# Patient Record
Sex: Female | Born: 1975 | Hispanic: Yes | Marital: Married | Smoking: Never smoker
Health system: Southern US, Community
[De-identification: ages and names within clinical notes are randomized; demographics above are authoritative.]

## PROBLEM LIST (undated history)

## (undated) DIAGNOSIS — H0489 Other disorders of lacrimal system: Secondary | ICD-10-CM

## (undated) DIAGNOSIS — Z8632 Personal history of gestational diabetes: Secondary | ICD-10-CM

## (undated) DIAGNOSIS — D649 Anemia, unspecified: Secondary | ICD-10-CM

## (undated) DIAGNOSIS — I10 Essential (primary) hypertension: Secondary | ICD-10-CM

## (undated) DIAGNOSIS — Z992 Dependence on renal dialysis: Secondary | ICD-10-CM

## (undated) DIAGNOSIS — N189 Chronic kidney disease, unspecified: Secondary | ICD-10-CM

## (undated) DIAGNOSIS — K219 Gastro-esophageal reflux disease without esophagitis: Secondary | ICD-10-CM

## (undated) HISTORY — PX: LAPAROSCOPY: SHX197

## (undated) HISTORY — PX: KIDNEY TRANSPLANT: SHX239

## (undated) HISTORY — PX: ENDOVENOUS ABLATION SAPHENOUS VEIN W/ LASER: SUR449

## (undated) HISTORY — DX: Personal history of gestational diabetes: Z86.32

## (undated) HISTORY — PX: TUBAL LIGATION: SHX77

## (undated) HISTORY — DX: Essential (primary) hypertension: I10

---
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
## 2012-06-29 ENCOUNTER — Encounter: Payer: Self-pay | Admitting: Family Medicine

## 2012-06-29 ENCOUNTER — Ambulatory Visit (INDEPENDENT_AMBULATORY_CARE_PROVIDER_SITE_OTHER): Payer: Self-pay | Admitting: Family Medicine

## 2012-06-29 VITALS — BP 141/90 | HR 68 | Ht 66.0 in | Wt 165.0 lb

## 2012-06-29 DIAGNOSIS — M722 Plantar fascial fibromatosis: Secondary | ICD-10-CM | POA: Insufficient documentation

## 2012-06-29 DIAGNOSIS — G2581 Restless legs syndrome: Secondary | ICD-10-CM

## 2012-06-29 DIAGNOSIS — M79672 Pain in left foot: Secondary | ICD-10-CM

## 2012-06-29 DIAGNOSIS — Z8632 Personal history of gestational diabetes: Secondary | ICD-10-CM

## 2012-06-29 DIAGNOSIS — M222X9 Patellofemoral disorders, unspecified knee: Secondary | ICD-10-CM | POA: Insufficient documentation

## 2012-06-29 DIAGNOSIS — M79671 Pain in right foot: Secondary | ICD-10-CM

## 2012-06-29 DIAGNOSIS — M222X2 Patellofemoral disorders, left knee: Secondary | ICD-10-CM

## 2012-06-29 DIAGNOSIS — M79609 Pain in unspecified limb: Secondary | ICD-10-CM

## 2012-06-29 DIAGNOSIS — M25569 Pain in unspecified knee: Secondary | ICD-10-CM

## 2012-06-29 HISTORY — DX: Personal history of gestational diabetes: Z86.32

## 2012-06-29 NOTE — Progress Notes (Signed)
CC: Sara Meza is a 37 y.o. female is here for Establish Care and tingling in toes   Subjective: HPI:  Very pleasant 37 year old here to establish care  Patient complains of 2-3 months bilateral tingling and burning sensation in all 10 toes. It is of moderate severity. It is worse with inactivity greatly improved with activity. Worse at night and with standing. She has tried wearing looser and more comfortable shoes no longer wearing high heels but no improvement in pain. It is present on a daily basis. She's never had this before. She does endorse a sensation of restlessness in both feet the more she moves the feet the more discuss away as it relieves the pain described above. She denies skin color changes, swelling of the feet, nor joint pain in the feet. She denies skin breakdown  Patient left knee pain that has been present for on and off for a year. It is described as a sharp and swelling sensation mild to moderate in severity it is worse within the first seconds to minutes of standing after sitting for long periods of time. It is painless when sitting stationary, it does not bother her when climbing or descending stairs. She has never had trauma to her knee. The pain is nonradiating and localized within the knee. No interventions as of yet. Nothing else makes better or worse     Review Of Systems Outlined In HPI  Past Medical History  Diagnosis Date  . History of gestational diabetes 06/29/2012     Family History  Problem Relation Age of Onset  . Diabetes Father      History  Substance Use Topics  . Smoking status: Not on file  . Smokeless tobacco: Not on file  . Alcohol Use: Yes     Objective: Filed Vitals:   06/29/12 1119  BP: 141/90  Pulse: 68    Vital signs reviewed. General: Alert and Oriented, No Acute Distress HEENT: Pupils equal, round, reactive to light. Conjunctivae clear.  External ears unremarkable.  Moist mucous membranes. Lungs: Clear and comfortable  work of breathing, speaking in full sentences without accessory muscle use. Cardiac: Regular rate and rhythm.  Neuro: CN II-XII grossly intact, gait normal. Extremities: No peripheral edema.  Strong peripheral pulses.  Left knee: Full range of motion strength throughout the knee, McMurray's negative, negative anterior drawer, no patellar apprehension with lateral or medial deviation but medial deviation does reproduce pain significantly. No pain with palpation of patella nor in the popliteal space. No joint line tenderness. No crepitus Diabetic Foot Exam: Dorsalis pedis pulses 1+ bilaterally.  Monofilament sensation intact on dorsal surface but deficient on plantar surface bilaterally.  Vibratory sensation in first MCP is preserved bilaterally No signs of infection, skin breakdown, nor ulceration. Mental Status: No depression, anxiety, nor agitation. Logical though process. Skin: Warm and dry.  Assessment & Plan: Sara Meza was seen today for establish care and tingling in toes.  Diagnoses and associated orders for this visit:  History of gestational diabetes - Hemoglobin A1c  Patella-femoral syndrome, left  Foot pain, bilateral - Iron and TIBC - TSH - B12 - Hemoglobin A1c  Restless legs - Iron and TIBC - TSH - B12 - Hemoglobin A1c    Foot pain with restless legs: History is suspicious for peripheral neuropathy will rule out type 2 diabetes, B12 deficiency, thyroid abnormality. Will rule out iron deficiency for classic restless leg syndrome Discussed with patient I believe her left knee pain is due to patellofemoral syndrome, we discussed  the etiology of this pain and she was given a home exercise plan to be performed daily for the next 2 weeks. Followup we based on results from above.  Return in about 4 weeks (around 07/27/2012).

## 2012-06-30 ENCOUNTER — Telehealth: Payer: Self-pay | Admitting: Family Medicine

## 2012-06-30 DIAGNOSIS — M79672 Pain in left foot: Secondary | ICD-10-CM

## 2012-06-30 DIAGNOSIS — M79671 Pain in right foot: Secondary | ICD-10-CM

## 2012-06-30 DIAGNOSIS — G2581 Restless legs syndrome: Secondary | ICD-10-CM

## 2012-06-30 LAB — HEMOGLOBIN A1C: Mean Plasma Glucose: 111 mg/dL (ref <117)

## 2012-06-30 LAB — IRON AND TIBC
%SAT: 9 % — ABNORMAL LOW (ref 20–55)
Iron: 31 ug/dL — ABNORMAL LOW (ref 42–145)
UIBC: 314 ug/dL (ref 125–400)

## 2012-06-30 LAB — TSH: TSH: 1.88 u[IU]/mL (ref 0.350–4.500)

## 2012-06-30 MED ORDER — AMITRIPTYLINE HCL 25 MG PO TABS: ORAL_TABLET | ORAL | Status: DC

## 2012-06-30 MED ORDER — FERROUS SULFATE 325 (65 FE) MG PO TBEC: 325.0000 mg | DELAYED_RELEASE_TABLET | Freq: Two times a day (BID) | ORAL | Status: DC

## 2012-06-30 NOTE — Telephone Encounter (Signed)
Pt notified and voiced understanding 

## 2012-06-30 NOTE — Telephone Encounter (Signed)
Seth Bake, Will you please let Mrs. Kooi know that her labs showed that she does not have diabetes, thyroid dysfunction, or a vitamin deficiency.  This is great news.  Her Iron level is somewhat low and could certainly be contributing to her frequent need to move her feet.  I'd encourage her to start OTC ferrous sulfate 325mg  twice a day for the next month to replace her iron levels.  I've sent in an Rx for amitriptyline to help with her tingling symptoms, I'd encourage her to f/u in a month to see if we need to continue the above regimen.

## 2012-07-06 ENCOUNTER — Telehealth: Payer: Self-pay | Admitting: *Deleted

## 2012-07-06 DIAGNOSIS — M25569 Pain in unspecified knee: Secondary | ICD-10-CM

## 2012-07-06 MED ORDER — DICLOFENAC SODIUM 50 MG PO TBEC: DELAYED_RELEASE_TABLET | ORAL | Status: DC

## 2012-07-06 NOTE — Telephone Encounter (Signed)
Pt called and states her knee pain has become worse since having started the excercises. Please advise if there is anything else she can do

## 2012-07-06 NOTE — Telephone Encounter (Signed)
Pt notified and will schedule appt with Dr. Darene Lamer

## 2012-07-06 NOTE — Telephone Encounter (Signed)
Sara Meza sent an Rx of diclofenac an antiinflammatory to her [facility] on file.  If things are getting worse then hold off on any more exercises.  I'd encourage her to see Dr. Darene Lamer here at her convienece as a sports medicine consult to see if xrays or other diagnositcs are needed.

## 2012-07-07 ENCOUNTER — Encounter: Payer: Self-pay | Admitting: Sports Medicine

## 2012-07-07 ENCOUNTER — Ambulatory Visit (INDEPENDENT_AMBULATORY_CARE_PROVIDER_SITE_OTHER): Payer: Self-pay | Admitting: Sports Medicine

## 2012-07-07 VITALS — BP 123/83 | HR 77

## 2012-07-07 DIAGNOSIS — M25569 Pain in unspecified knee: Secondary | ICD-10-CM

## 2012-07-07 DIAGNOSIS — M222X2 Patellofemoral disorders, left knee: Secondary | ICD-10-CM

## 2012-07-07 NOTE — Assessment & Plan Note (Signed)
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 Go ahead and start diclofenac 50 mg 3 times a day. Work hard on patellofemoral rehabilitation. Patellar stabilizing brace. X-rays. Return in 4 weeks to see how things are going.

## 2012-07-07 NOTE — Progress Notes (Signed)
   Subjective:    I'm seeing this patient as a consultation for:  Dr. Ileene Rubens  CC: Left knee pain  HPI: This is a very pleasant 37 year old female who comes in with a several month history of pain she localizes over the anterior aspect of her left knee, worse with squatting, going up and down stairs, getting up out of a chair. She denies trauma, has occasional grinding sensations but no locking or popping or buckling, on the anterior aspect of her left knee. Pain is localized to this area, moderate. She saw  Dr. Ileene Rubens who prescribed diclofenac 3 times a day, as well as home exercises appropriately. She has not done any of this.   Past medical history, Surgical history, Family history not pertinant except as noted below, Social history, Allergies, and medications have been entered into the medical record, reviewed, and no changes needed.   Review of Systems: No headache, visual changes, nausea, vomiting, diarrhea, constipation, dizziness, abdominal pain, skin rash, fevers, chills, night sweats, weight loss, swollen lymph nodes, body aches, joint swelling, muscle aches, chest pain, shortness of breath, mood changes, visual or auditory hallucinations.   Objective:   General: Well Developed, well nourished, and in no acute distress.  Neuro/Psych: Alert and oriented x3, extra-ocular muscles intact, able to move all 4 extremities, sensation grossly intact. Skin: Warm and dry, no rashes noted.  Respiratory: Not using accessory muscles, speaking in full sentences, trachea midline.  Cardiovascular: Pulses palpable, no extremity edema. Abdomen: Does not appear distended. Left Knee: Normal to inspection with no erythema or effusion or obvious bony abnormalities. Palpation normal with no warmth, joint line tenderness, patellar tenderness, or condyle tenderness. ROM full in flexion and extension and lower leg rotation. Ligaments with solid consistent endpoints including ACL, PCL, LCL, MCL. Negative  Mcmurray's, Apley's, and Thessalonian tests. Painful patellar compression. Patellar glide with mild crepitus. Patellar and quadriceps tendons unremarkable. Hamstring and quadriceps strength is normal.   Patellar stabilizing brace was strapped on by me. Impression and Recommendations:   This case required medical decision making of moderate complexity.

## 2012-08-04 ENCOUNTER — Ambulatory Visit: Payer: Self-pay | Admitting: Sports Medicine

## 2012-08-25 ENCOUNTER — Ambulatory Visit (INDEPENDENT_AMBULATORY_CARE_PROVIDER_SITE_OTHER): Payer: Self-pay

## 2012-08-25 ENCOUNTER — Ambulatory Visit (INDEPENDENT_AMBULATORY_CARE_PROVIDER_SITE_OTHER): Payer: Self-pay | Admitting: Sports Medicine

## 2012-08-25 ENCOUNTER — Encounter: Payer: Self-pay | Admitting: Sports Medicine

## 2012-08-25 VITALS — BP 146/98 | HR 77 | Wt 164.0 lb

## 2012-08-25 DIAGNOSIS — M25572 Pain in left ankle and joints of left foot: Secondary | ICD-10-CM | POA: Insufficient documentation

## 2012-08-25 DIAGNOSIS — M25579 Pain in unspecified ankle and joints of unspecified foot: Secondary | ICD-10-CM

## 2012-08-25 DIAGNOSIS — M25569 Pain in unspecified knee: Secondary | ICD-10-CM

## 2012-08-25 DIAGNOSIS — M222X2 Patellofemoral disorders, left knee: Secondary | ICD-10-CM

## 2012-08-25 MED ORDER — DICLOFENAC SODIUM 75 MG PO TBEC: 75.0000 mg | DELAYED_RELEASE_TABLET | Freq: Two times a day (BID) | ORAL | Status: DC

## 2012-08-25 NOTE — Progress Notes (Signed)
  Subjective:    CC: Follow up  HPI: Left knee pain: Symptoms are mostly patellofemoral, she has done 2 months of physical therapy, as well as diclofenac twice a day only 50 mg. Overall symptoms are not improved. Persistent, localized in the anterior knee, worse with squats, and going up and down stairs.  Left ankle pain: She is getting some swelling when she has a knee brace on and this is painful, no injuries to the ankle, she does wear flats most of the time.  Past medical history, Surgical history, Family history not pertinant except as noted below, Social history, Allergies, and medications have been entered into the medical record, reviewed, and no changes needed.   Review of Systems: No fevers, chills, night sweats, weight loss, chest pain, or shortness of breath.   Objective:    General: Well Developed, well nourished, and in no acute distress.  Neuro: Alert and oriented x3, extra-ocular muscles intact, sensation grossly intact.  HEENT: Normocephalic, atraumatic, pupils equal round reactive to light, neck supple, no masses, no lymphadenopathy, thyroid nonpalpable.  Skin: Warm and dry, no rashes. Cardiac: Regular rate and rhythm, no murmurs rubs or gallops, no lower extremity edema.  Respiratory: Clear to auscultation bilaterally. Not using accessory muscles, speaking in full sentences. Left Knee: Normal to inspection with no erythema or effusion or obvious bony abnormalities. Palpation normal with no warmth, joint line tenderness, patellar tenderness, or condyle tenderness. ROM full in flexion and extension and lower leg rotation. Ligaments with solid consistent endpoints including ACL, PCL, LCL, MCL. Negative Mcmurray's, Apley's, and Thessalonian tests. Tender to palpation under the patellar facets. Patellar glide without crepitus. Patellar and quadriceps tendons unremarkable. Hamstring and quadriceps strength is normal.  Left Ankle: No visible erythema or swelling. Range of  motion is full in all directions. Strength is 5/5 in all directions. Stable lateral and medial ligaments; squeeze test and kleiger test unremarkable; Tender to palpation along the lateral talar dome. No pain at base of 5th MT; No tenderness over cuboid; No tenderness over N spot or navicular prominence No tenderness on posterior aspects of lateral and medial malleolus No sign of peroneal tendon subluxations or tenderness to palpation Negative tarsal tunnel tinel's Able to walk 4 steps.  Procedure: Real-time Ultrasound Guided Injection of left knee Device: GE Logiq E  Verbal informed consent obtained.  Time-out conducted.  Noted no overlying erythema, induration, or other signs of local infection.  Skin prepped in a sterile fashion.  Local anesthesia: Topical Ethyl chloride.  With sterile technique and under real time ultrasound guidance:  25-gauge needle advanced into the suprapatellar recess, 2 cc Kenalog 44 cc lidocaine injected easily. Completed without difficulty  Pain immediately resolved suggesting accurate placement of the medication.  Advised to call if fevers/chills, erythema, induration, drainage, or persistent bleeding.  Images permanently stored and available for review in the ultrasound unit.  Impression: Technically successful ultrasound guided injection.  Impression and Recommendations:

## 2012-08-25 NOTE — Assessment & Plan Note (Signed)
Persistent patellofemoral pain status post 2 months of conservative measures including rehabilitation and patellar stabilizing brace, as well as NSAIDs. Injection as above. X-rays. Return in one month for this.

## 2012-08-25 NOTE — Assessment & Plan Note (Signed)
X-rays, ankle exercises. Increasing NSAIDs. Some of the swelling may be related to the proximal knee brace.

## 2012-09-22 ENCOUNTER — Ambulatory Visit: Payer: Self-pay | Admitting: Sports Medicine

## 2012-09-25 ENCOUNTER — Ambulatory Visit (INDEPENDENT_AMBULATORY_CARE_PROVIDER_SITE_OTHER): Payer: Self-pay | Admitting: Sports Medicine

## 2012-09-25 ENCOUNTER — Encounter: Payer: Self-pay | Admitting: Sports Medicine

## 2012-09-25 VITALS — BP 130/83 | HR 75 | Wt 162.0 lb

## 2012-09-25 DIAGNOSIS — M222X2 Patellofemoral disorders, left knee: Secondary | ICD-10-CM

## 2012-09-25 DIAGNOSIS — M79671 Pain in right foot: Secondary | ICD-10-CM

## 2012-09-25 DIAGNOSIS — M25579 Pain in unspecified ankle and joints of unspecified foot: Secondary | ICD-10-CM

## 2012-09-25 DIAGNOSIS — M25572 Pain in left ankle and joints of left foot: Secondary | ICD-10-CM

## 2012-09-25 DIAGNOSIS — M79672 Pain in left foot: Secondary | ICD-10-CM

## 2012-09-25 DIAGNOSIS — M79609 Pain in unspecified limb: Secondary | ICD-10-CM

## 2012-09-25 NOTE — Assessment & Plan Note (Signed)
Resolved with bracing, physical therapy, Mobic, and injection. Return as needed for this, continue exercises.

## 2012-09-25 NOTE — Progress Notes (Signed)
  Subjective:    CC: Followup  HPI: Patellofemoral pain: Completely resolved with brace, PT, and injection at the last visit.  Left ankle pain: Still swells, she does recall an injury several months back that resulted in bruising and swelling, now she has pain which he localizes over the lateral talar dome, she also gets an abnormal popping sensation. Pain is mild, persistent. No radiation.  Bilateral foot pain: Pain is localized to the calcaneal insertion of the plantar fascia, no radiation, worse with the first few steps of the day.  Past medical history, Surgical history, Family history not pertinant except as noted below, Social history, Allergies, and medications have been entered into the medical record, reviewed, and no changes needed.   Review of Systems: No fevers, chills, night sweats, weight loss, chest pain, or shortness of breath.   Objective:    General: Well Developed, well nourished, and in no acute distress.  Neuro: Alert and oriented x3, extra-ocular muscles intact, sensation grossly intact.  HEENT: Normocephalic, atraumatic, pupils equal round reactive to light, neck supple, no masses, no lymphadenopathy, thyroid nonpalpable.  Skin: Warm and dry, no rashes. Cardiac: Regular rate and rhythm, no murmurs rubs or gallops, no lower extremity edema.  Respiratory: Clear to auscultation bilaterally. Not using accessory muscles, speaking in full sentences. Right Ankle: No visible erythema or swelling. Range of motion is full in all directions. Strength is 5/5 in all directions. Negative kleiger test, negative squeeze test. Positive anterior drawer test with significant anterior luxation of the ankle, negative talar tilt. Tender to palpation over the lateral talar dome. No pain at base of 5th MT; No tenderness over cuboid; No tenderness over N spot or navicular prominence No tenderness on posterior aspects of lateral and medial malleolus No sign of peroneal tendon  subluxations or tenderness to palpation Negative tarsal tunnel tinel's Able to walk 4 steps.  Impression and Recommendations:

## 2012-09-25 NOTE — Assessment & Plan Note (Signed)
This likely represents plantar fasciitis. I would like physical therapist to work with her from plantar fascia exercises and stretches, I'm also going to go for custom orthotics. Await to see her back for custom orthotics, and then in 4-6 weeks for consideration of injection if no better.

## 2012-09-25 NOTE — Assessment & Plan Note (Signed)
With a positive anterior drawer sign, this likely represents a distant ligamentous injury. We're going to focus aggressively on ankle rehabilitation, this will likely improve her pain. I like her to do for 6 weeks a formal physical therapy, she will return to see me after that we consider further investigation if no better.

## 2012-10-05 ENCOUNTER — Encounter: Payer: Self-pay | Admitting: Sports Medicine

## 2012-10-09 ENCOUNTER — Ambulatory Visit (INDEPENDENT_AMBULATORY_CARE_PROVIDER_SITE_OTHER): Payer: Self-pay | Admitting: Sports Medicine

## 2012-10-09 ENCOUNTER — Encounter: Payer: Self-pay | Admitting: Sports Medicine

## 2012-10-09 VITALS — BP 132/87 | HR 73 | Wt 163.0 lb

## 2012-10-09 DIAGNOSIS — M25579 Pain in unspecified ankle and joints of unspecified foot: Secondary | ICD-10-CM

## 2012-10-09 DIAGNOSIS — M25572 Pain in left ankle and joints of left foot: Secondary | ICD-10-CM

## 2012-10-09 DIAGNOSIS — M25569 Pain in unspecified knee: Secondary | ICD-10-CM

## 2012-10-09 DIAGNOSIS — M722 Plantar fascial fibromatosis: Secondary | ICD-10-CM

## 2012-10-09 DIAGNOSIS — M222X9 Patellofemoral disorders, unspecified knee: Secondary | ICD-10-CM

## 2012-10-09 NOTE — Progress Notes (Signed)
    Patient was fitted for a : standard, cushioned, semi-rigid orthotic. The orthotic was heated and afterward the patient stood on the orthotic blank positioned on the orthotic stand. The patient was positioned in subtalar neutral position and 10 degrees of ankle dorsiflexion in a weight bearing stance. After completion of molding, a stable base was applied to the orthotic blank. The blank was ground to a stable position for weight bearing. Size: 7 Base: Blue EVA Additional Posting and Padding: None The patient ambulated these, and they were very comfortable.  I spent 40 minutes with this patient, greater than 50% was face-to-face time counseling regarding the below diagnosis.

## 2012-10-09 NOTE — Assessment & Plan Note (Signed)
Custom orthotics, MRI if no better after PT.

## 2012-10-09 NOTE — Assessment & Plan Note (Signed)
Custom orthotics as above. Formal physical therapy for another 4-6 weeks. Injection if no better.

## 2012-10-09 NOTE — Assessment & Plan Note (Addendum)
Continues to be resolved after injection 2 and a half months ago.

## 2012-10-12 ENCOUNTER — Ambulatory Visit: Payer: Self-pay

## 2012-10-12 DIAGNOSIS — R269 Unspecified abnormalities of gait and mobility: Secondary | ICD-10-CM

## 2012-10-12 DIAGNOSIS — M25579 Pain in unspecified ankle and joints of unspecified foot: Secondary | ICD-10-CM

## 2012-10-12 DIAGNOSIS — M25673 Stiffness of unspecified ankle, not elsewhere classified: Secondary | ICD-10-CM

## 2012-10-12 DIAGNOSIS — M6281 Muscle weakness (generalized): Secondary | ICD-10-CM

## 2012-10-12 DIAGNOSIS — M25676 Stiffness of unspecified foot, not elsewhere classified: Secondary | ICD-10-CM

## 2012-10-18 ENCOUNTER — Encounter: Payer: Self-pay | Admitting: Physical Therapy

## 2012-10-18 DIAGNOSIS — M25676 Stiffness of unspecified foot, not elsewhere classified: Secondary | ICD-10-CM

## 2012-10-18 DIAGNOSIS — M6281 Muscle weakness (generalized): Secondary | ICD-10-CM

## 2012-10-18 DIAGNOSIS — M25673 Stiffness of unspecified ankle, not elsewhere classified: Secondary | ICD-10-CM

## 2012-10-18 DIAGNOSIS — M25579 Pain in unspecified ankle and joints of unspecified foot: Secondary | ICD-10-CM

## 2012-10-18 DIAGNOSIS — R269 Unspecified abnormalities of gait and mobility: Secondary | ICD-10-CM

## 2012-10-19 ENCOUNTER — Encounter: Payer: Self-pay | Admitting: Physical Therapy

## 2012-10-19 DIAGNOSIS — M25676 Stiffness of unspecified foot, not elsewhere classified: Secondary | ICD-10-CM

## 2012-10-19 DIAGNOSIS — M6281 Muscle weakness (generalized): Secondary | ICD-10-CM

## 2012-10-19 DIAGNOSIS — M25579 Pain in unspecified ankle and joints of unspecified foot: Secondary | ICD-10-CM

## 2012-10-19 DIAGNOSIS — R269 Unspecified abnormalities of gait and mobility: Secondary | ICD-10-CM

## 2012-10-19 DIAGNOSIS — M25673 Stiffness of unspecified ankle, not elsewhere classified: Secondary | ICD-10-CM

## 2012-10-19 DIAGNOSIS — M2550 Pain in unspecified joint: Secondary | ICD-10-CM

## 2012-10-23 ENCOUNTER — Encounter: Payer: Self-pay | Admitting: Physical Therapy

## 2012-10-23 DIAGNOSIS — M25673 Stiffness of unspecified ankle, not elsewhere classified: Secondary | ICD-10-CM

## 2012-10-23 DIAGNOSIS — M6281 Muscle weakness (generalized): Secondary | ICD-10-CM

## 2012-10-23 DIAGNOSIS — R269 Unspecified abnormalities of gait and mobility: Secondary | ICD-10-CM

## 2012-10-23 DIAGNOSIS — M25676 Stiffness of unspecified foot, not elsewhere classified: Secondary | ICD-10-CM

## 2012-10-23 DIAGNOSIS — M25579 Pain in unspecified ankle and joints of unspecified foot: Secondary | ICD-10-CM

## 2012-10-26 ENCOUNTER — Encounter: Payer: Self-pay | Admitting: Physical Therapy

## 2012-11-20 ENCOUNTER — Ambulatory Visit: Payer: Self-pay | Admitting: Sports Medicine

## 2012-11-23 ENCOUNTER — Other Ambulatory Visit: Payer: Self-pay

## 2014-11-22 ENCOUNTER — Encounter: Payer: Self-pay | Admitting: Family Medicine

## 2014-11-22 ENCOUNTER — Ambulatory Visit (INDEPENDENT_AMBULATORY_CARE_PROVIDER_SITE_OTHER): Payer: Self-pay | Admitting: Family Medicine

## 2014-11-22 VITALS — BP 147/94 | HR 73 | Wt 168.0 lb

## 2014-11-22 DIAGNOSIS — I10 Essential (primary) hypertension: Secondary | ICD-10-CM

## 2014-11-22 DIAGNOSIS — M79673 Pain in unspecified foot: Secondary | ICD-10-CM

## 2014-11-22 DIAGNOSIS — O24419 Gestational diabetes mellitus in pregnancy, unspecified control: Secondary | ICD-10-CM

## 2014-11-22 LAB — POCT GLYCOSYLATED HEMOGLOBIN (HGB A1C): Hemoglobin A1C: 5.7

## 2014-11-22 MED ORDER — CLOTRIMAZOLE-BETAMETHASONE 1-0.05 % EX CREA: TOPICAL_CREAM | CUTANEOUS | Status: AC

## 2014-11-22 MED ORDER — LISINOPRIL 20 MG PO TABS: 20.0000 mg | ORAL_TABLET | Freq: Every day | ORAL | Status: DC

## 2014-11-22 NOTE — Progress Notes (Signed)
CC: Sara Meza is a 39 y.o. female is here for Hypertension and Foot Pain   Subjective: HPI:  Patient has been taking her blood pressure outside of our office and notices that numbers are consistently just slightly above 140/90. She has a strong family history of high blood pressure and her family. She's tried her best to cut out sodium in her diet. She's also cut back on sugar. She denies chest pain shortness of breath orthopnea nor peripheral edema.  Bilateral foot pain present only at rest described as a tingling sensation and cramping sensation. Localized only in the toes and the toes or cramping any flexion position. Symptoms are absent all hours of the day when she is up and active and only present when she gets in her bed. She's also had dry skin at this area but no other overlying skin changes. She denies any itching or weakness. She is concerned it could be diabetes.   Review Of Systems Outlined In HPI  Past Medical History  Diagnosis Date  . History of gestational diabetes 06/29/2012    No past surgical history on file. Family History  Problem Relation Age of Onset  . Diabetes Father     Social History   Social History  . Marital Status: Married    Spouse Name: N/A  . Number of Children: N/A  . Years of Education: N/A   Occupational History  . Not on file.   Social History Main Topics  . Smoking status: Never Smoker   . Smokeless tobacco: Not on file  . Alcohol Use: Yes  . Drug Use: No  . Sexual Activity: Not on file   Other Topics Concern  . Not on file   Social History Narrative     Objective: BP 147/94 mmHg  Pulse 73  Wt 168 lb (76.204 kg)  Vital signs reviewed. General: Alert and Oriented, No Acute Distress HEENT: Pupils equal, round, reactive to light. Conjunctivae clear.  External ears unremarkable.  Moist mucous membranes. Lungs: Clear and comfortable work of breathing, speaking in full sentences without accessory muscle use. Cardiac: Regular  rate and rhythm.  Neuro: CN II-XII grossly intact, gait normal. Extremities: No peripheral edema.  Strong peripheral pulses.  Mental Status: No depression, anxiety, nor agitation. Logical though process. Skin: Warm and dry. Mild flaking of the skin involving the toes bilaterally. No erythema at the site of her pain on the feet.   Assessment & Plan: Sara Meza was seen today for hypertension and foot pain.  Diagnoses and all orders for this visit:  Essential hypertension -     lisinopril (PRINIVIL,ZESTRIL) 20 MG tablet; Take 1 tablet (20 mg total) by mouth daily.  Gestational diabetes mellitus, unspecified diabetic control, unspecified trimester -     POCT HgB A1C  Foot pain, unspecified laterality -     clotrimazole-betamethasone (LOTRISONE) cream; Apply to affected area twice a day for two weeks, may require four weeks if involving the feet/toes.   Essential hypertension: Uncontrolled chronic condition starting lisinopril return in one month Gestational diabetes: Reassurance provided that her A1c was 5.7 and far from the diabetic range. Unlikely that high blood sugars causing the neuropathy in her   toes therefore start Lotrisone for possibility of athlete's foot.  Return in about 4 weeks (around 12/20/2014) for Blood Pressure Check.

## 2014-12-20 ENCOUNTER — Encounter: Payer: Self-pay | Admitting: Family Medicine

## 2014-12-20 ENCOUNTER — Ambulatory Visit (INDEPENDENT_AMBULATORY_CARE_PROVIDER_SITE_OTHER): Payer: Self-pay | Admitting: Family Medicine

## 2014-12-20 VITALS — BP 143/92 | HR 77 | Wt 168.0 lb

## 2014-12-20 DIAGNOSIS — I10 Essential (primary) hypertension: Secondary | ICD-10-CM

## 2014-12-20 MED ORDER — LOSARTAN POTASSIUM 100 MG PO TABS: 100.0000 mg | ORAL_TABLET | Freq: Every day | ORAL | Status: DC

## 2014-12-20 NOTE — Progress Notes (Signed)
CC: Sara Meza is a 39 y.o. female is here for Hypertension; dry mouth; Cough; and Menstrual Problem   Subjective: HPI:  Follow up essential hypertension: No outside blood pressures to report. A couple days after starting lisinopril she developed a cough. It's described as dry, mild in severity but present on a daily basis. Denies any other known side effects. Denies chest pain shortness of breath orthopnea or peripheral edema   Review Of Systems Outlined In HPI  Past Medical History  Diagnosis Date  . History of gestational diabetes 06/29/2012    No past surgical history on file. Family History  Problem Relation Age of Onset  . Diabetes Father     Social History   Social History  . Marital Status: Married    Spouse Name: N/A  . Number of Children: N/A  . Years of Education: N/A   Occupational History  . Not on file.   Social History Main Topics  . Smoking status: Never Smoker   . Smokeless tobacco: Not on file  . Alcohol Use: Yes  . Drug Use: No  . Sexual Activity: Not on file   Other Topics Concern  . Not on file   Social History Narrative     Objective: BP 143/92 mmHg  Pulse 77  Wt 168 lb (76.204 kg)  General: Alert and Oriented, No Acute Distress HEENT: Pupils equal, round, reactive to light. Conjunctivae clear.  Moist mucous membranes Lungs: Clear to auscultation bilaterally, no wheezing/ronchi/rales.  Comfortable work of breathing. Good air movement. Cardiac: Regular rate and rhythm. Normal S1/S2.  No murmurs, rubs, nor gallops.   Extremities: No peripheral edema.  Strong peripheral pulses.  Mental Status: No depression, anxiety, nor agitation. Skin: Warm and dry.  Assessment & Plan: Sara Meza was seen today for hypertension, dry mouth, cough and menstrual problem.  Diagnoses and all orders for this visit:  Essential hypertension -     losartan (COZAAR) 100 MG tablet; Take 1 tablet (100 mg total) by mouth daily.   Essential hypertension:  Uncontrolled and side effects from lisinopril therefore switching to losartan.   Return in about 4 weeks (around 01/17/2015).

## 2015-01-24 ENCOUNTER — Ambulatory Visit (INDEPENDENT_AMBULATORY_CARE_PROVIDER_SITE_OTHER): Payer: Self-pay | Admitting: Family Medicine

## 2015-01-24 ENCOUNTER — Encounter: Payer: Self-pay | Admitting: Family Medicine

## 2015-01-24 DIAGNOSIS — Z0289 Encounter for other administrative examinations: Secondary | ICD-10-CM

## 2015-01-24 NOTE — Progress Notes (Signed)
No show 01/24/2015

## 2018-02-21 ENCOUNTER — Encounter: Payer: Self-pay | Admitting: Physician Assistant

## 2018-02-21 ENCOUNTER — Ambulatory Visit (INDEPENDENT_AMBULATORY_CARE_PROVIDER_SITE_OTHER): Payer: Self-pay | Admitting: Physician Assistant

## 2018-02-21 VITALS — BP 143/96 | HR 80 | Wt 159.0 lb

## 2018-02-21 DIAGNOSIS — H35039 Hypertensive retinopathy, unspecified eye: Secondary | ICD-10-CM | POA: Insufficient documentation

## 2018-02-21 DIAGNOSIS — Z7689 Persons encountering health services in other specified circumstances: Secondary | ICD-10-CM

## 2018-02-21 DIAGNOSIS — Z9851 Tubal ligation status: Secondary | ICD-10-CM

## 2018-02-21 DIAGNOSIS — Z131 Encounter for screening for diabetes mellitus: Secondary | ICD-10-CM

## 2018-02-21 DIAGNOSIS — Z130 Encounter for screening for diseases of the blood and blood-forming organs and certain disorders involving the immune mechanism: Secondary | ICD-10-CM

## 2018-02-21 DIAGNOSIS — R21 Rash and other nonspecific skin eruption: Secondary | ICD-10-CM

## 2018-02-21 DIAGNOSIS — R600 Localized edema: Secondary | ICD-10-CM

## 2018-02-21 DIAGNOSIS — Z8679 Personal history of other diseases of the circulatory system: Secondary | ICD-10-CM

## 2018-02-21 DIAGNOSIS — H3500 Unspecified background retinopathy: Secondary | ICD-10-CM

## 2018-02-21 DIAGNOSIS — I10 Essential (primary) hypertension: Secondary | ICD-10-CM | POA: Insufficient documentation

## 2018-02-21 DIAGNOSIS — E611 Iron deficiency: Secondary | ICD-10-CM

## 2018-02-21 DIAGNOSIS — Z800 Family history of malignant neoplasm of digestive organs: Secondary | ICD-10-CM

## 2018-02-21 DIAGNOSIS — Z13220 Encounter for screening for lipoid disorders: Secondary | ICD-10-CM

## 2018-02-21 DIAGNOSIS — Z1231 Encounter for screening mammogram for malignant neoplasm of breast: Secondary | ICD-10-CM

## 2018-02-21 DIAGNOSIS — R609 Edema, unspecified: Secondary | ICD-10-CM

## 2018-02-21 HISTORY — DX: Tubal ligation status: Z98.51

## 2018-02-21 MED ORDER — DOXYCYCLINE HYCLATE 100 MG PO TABS: 100.0000 mg | ORAL_TABLET | Freq: Two times a day (BID) | ORAL | 0 refills | Status: DC

## 2018-02-21 MED ORDER — HYDROCHLOROTHIAZIDE 12.5 MG PO TABS: 12.5000 mg | ORAL_TABLET | Freq: Every day | ORAL | 3 refills | Status: DC

## 2018-02-21 NOTE — Progress Notes (Signed)
HPI:                                                                Sara Meza is a 43 y.o. female who presents to [facility] today to establish care  Current concerns:  HTN: has not taken any medication since 2017. Had some intolerance to Lisinopril states it worsened her menses.  Decided to try to manage with lifestyle measures. Does not check BP's at home. She went to her eye doctor 2 weeks ago. Patient states eye doctor reports a "blood clot" behind left eye due to high blood pressure.Denies vision change, headache, chest pain with exertion, orthopnea, lightheadedness, syncope. Endorses mild edema    Rash: Developed bilateral lower extremity swelling and painful, pruritc rash approximate 1 month ago. Symptoms developed after trip to [place] where she was vacationing on the beach. She also reports hx of multiple mosquito bites at the time. No fever, myalgia, arthralgia or malaise. Reports rash in left leg has almost resolved, but here is still residual swelling. Rash in right leg is still present and painful, but pain has gradually improved, now rated as 2/10.   Varicose veins: Reports she had varicose vein procedures in June and October on each leg. She had endovenous laser ablation and 3 chemical ablations of both legs. Her vascular surgeon saw the rash at a follow-up appt recently and told her it could be erythema nodosum.   Depression screen [facility] 2/9 02/21/2018  Decreased Interest 0  Down, Depressed, Hopeless 0  PHQ - 2 Score 0  Altered sleeping 1  Tired, decreased energy 1  Change in appetite 1  Feeling bad or failure about yourself  0  Trouble concentrating 0  Moving slowly or fidgety/restless 0  Suicidal thoughts 0  PHQ-9 Score 3    No flowsheet data found.    Past Medical History:  Diagnosis Date  . History of gestational diabetes 06/29/2012  . Hypertension    Past Surgical History:  Procedure Laterality Date   . ENDOVENOUS ABLATION SAPHENOUS VEIN W/ LASER    . TUBAL LIGATION     Social History   Tobacco Use  . Smoking status: Never Smoker  . Smokeless tobacco: Never Used  Substance Use Topics  . Alcohol use: Not Currently   family history includes Colon cancer in her sister; Diabetes in her father and mother; Hypertension in her father and mother; Wilm's tumor in her sister.    ROS: Review of Systems  HENT: Positive for sinus pain.   Eyes: Positive for blurred vision.  Gastrointestinal: Positive for constipation, heartburn and vomiting.  Musculoskeletal: Positive for back pain.  Endo/Heme/Allergies: Positive for polydipsia.     Medications: Current Outpatient Medications  Medication Sig Dispense Refill  . Aromatic Inhalants (VICKS VAPOR INHALER IN) Inhale 2 puffs into the lungs daily as needed (stuffy nose).    Marland Kitchen BIOTIN PO Take 2 tablets by mouth daily.    . Chlorphen-Phenyleph-APAP 2-5-325 MG TABS Take 2 tablets by mouth daily as needed (cough, headache, congestion).    Marland Kitchen doxycycline (VIBRA-TABS) 100 MG tablet Take 1 tablet (100 mg total) by mouth 2 (two) times daily for 7 days. 14 tablet 0  . hydrochlorothiazide (HYDRODIURIL) 12.5 MG tablet Take 1  tablet (12.5 mg total) by mouth daily. 30 tablet 3  . ibuprofen (ADVIL,MOTRIN) 200 MG tablet Take 400 mg by mouth every 6 (six) hours as needed for headache or moderate pain.    . medroxyPROGESTERone (PROVERA) 10 MG tablet Take 2 tablets (20 mg total) by mouth 3 (three) times daily for 7 days. (Patient not taking: Reported on 02/22/2018) 42 tablet 0   No current facility-administered medications for this visit.    Allergies  Allergen Reactions  . Lisinopril Other (See Comments)    irregular menses       Objective:  BP (!) 143/96   Pulse 80   Wt 159 lb (72.1 kg)   LMP 12/22/2017   BMI 25.66 kg/m  Gen:  alert, not ill-appearing, no distress, appropriate for age 15: head normocephalic without obvious abnormality,  conjunctiva and cornea clear, trachea midline Pulm: Normal work of breathing, normal phonation, clear to auscultation bilaterally, no wheezes, rales or rhonchi CV: Normal rate, regular rhythm, s1 and s2 distinct, no murmurs, clicks or rubs  Neuro: alert and oriented x 3, no tremor MSK: extremities atraumatic, normal gait and station, grade 1+ peripheral edema bilaterally Skin: intact, right distal anterior lower extremity there is a tender, red rectangular patch with demarcated borders Psych: well-groomed, cooperative, good eye contact, euthymic mood, affect mood-congruent, speech is articulate, and thought processes clear and goal-directed  Lab Results  Component Value Date   CREATININE 10.65 (H) 02/21/2018   BUN 85 (H) 02/21/2018   NA 140 02/21/2018   K 4.6 02/21/2018   CL 112 (H) 02/21/2018   CO2 12 (L) 02/21/2018     Results for orders placed or performed in visit on 02/21/18 (from the past 72 hour(s))  COMPLETE METABOLIC PANEL WITH GFR     Status: Abnormal   Collection Time: 02/21/18  3:41 PM  Result Value Ref Range   Glucose, Bld 77 65 - 99 mg/dL    Comment: .            Fasting reference interval .    BUN 85 (H) 7 - 25 mg/dL   Creat 10.65 (H) 0.50 - 1.10 mg/dL    Comment: Verified by repeat analysis. .    GFR, Est Non African American 4 (L) > OR = 60 mL/min/1.16m2   GFR, Est African American 5 (L) > OR = 60 mL/min/1.49m2   BUN/Creatinine Ratio 8 6 - 22 (calc)   Sodium 140 135 - 146 mmol/L   Potassium 4.6 3.5 - 5.3 mmol/L   Chloride 112 (H) 98 - 110 mmol/L   CO2 12 (L) 20 - 32 mmol/L    Comment: Verified by repeat analysis. .    Calcium 8.9 8.6 - 10.2 mg/dL   Total Protein 7.5 6.1 - 8.1 g/dL   Albumin 4.1 3.6 - 5.1 g/dL   Globulin 3.4 1.9 - 3.7 g/dL (calc)   AG Ratio 1.2 1.0 - 2.5 (calc)   Total Bilirubin 0.3 0.2 - 1.2 mg/dL   Alkaline phosphatase (APISO) 74 31 - 125 U/L   AST 6 (L) 10 - 30 U/L   ALT 8 6 - 29 U/L  Lipid Panel w/reflex Direct LDL     Status:  Abnormal   Collection Time: 02/21/18  3:41 PM  Result Value Ref Range   Cholesterol 181 <200 mg/dL   HDL 40 (L) > OR = 50 mg/dL   Triglycerides 136 <150 mg/dL   LDL Cholesterol (Calc) 116 (H) mg/dL (calc)    Comment: Reference range: <  100 . Desirable range <100 mg/dL for primary prevention;   <70 mg/dL for patients with CHD or diabetic patients  with > or = 2 CHD risk factors. Marland Kitchen LDL-C is now calculated using the Martin-Hopkins  calculation, which is a validated novel method providing  better accuracy than the Friedewald equation in the  estimation of LDL-C.  Cresenciano Genre et al. Annamaria Helling. 1324;401(02): 2061-2068  (http://education.[employer].com/faq/FAQ164)    Total CHOL/HDL Ratio 4.5 <5.0 (calc)   Non-HDL Cholesterol (Calc) 141 (H) <130 mg/dL (calc)    Comment: For patients with diabetes plus 1 major ASCVD risk  factor, treating to a non-HDL-C goal of <100 mg/dL  (LDL-C of <70 mg/dL) is considered a therapeutic  option.   Fe+TIBC+Fer     Status: Abnormal   Collection Time: 02/21/18  3:41 PM  Result Value Ref Range   Iron 21 (L) 40 - 190 mcg/dL   TIBC 233 (L) 250 - 450 mcg/dL (calc)   %SAT 9 (L) 16 - 45 % (calc)   Ferritin 22 16 - 232 ng/mL  CBC with Differential/Platelet     Status: Abnormal   Collection Time: 02/21/18  3:44 PM  Result Value Ref Range   WBC 8.6 3.8 - 10.8 Thousand/uL   RBC 2.15 (L) 3.80 - 5.10 Million/uL   Hemoglobin 5.7 (LL) 11.7 - 15.5 g/dL    Comment: Verified by repeat analysis. Marland Kitchen    HCT 17.3 (L) 35.0 - 45.0 %   MCV 80.5 80.0 - 100.0 fL   MCH 26.5 (L) 27.0 - 33.0 pg   MCHC 32.9 32.0 - 36.0 g/dL   RDW 15.9 (H) 11.0 - 15.0 %   Platelets 396 140 - 400 Thousand/uL   MPV 9.6 7.5 - 12.5 fL   Neutro Abs 6,631 1,500 - 7,800 cells/uL   Lymphs Abs 1,273 850 - 3,900 cells/uL   Absolute Monocytes 370 200 - 950 cells/uL   Eosinophils Absolute 301 15 - 500 cells/uL   Basophils Absolute 26 0 - 200 cells/uL   Neutrophils Relative % 77.1 %   Total Lymphocyte  14.8 %   Monocytes Relative 4.3 %   Eosinophils Relative 3.5 %   Basophils Relative 0.3 %  CBC MORPHOLOGY     Status: None   Collection Time: 02/21/18  3:44 PM  Result Value Ref Range   CBC MORPHOLOGY  NORMAL    Comment: Crenated red blood cells 2 + Anisocytosis 1 + Poikilocytosis 2 +    No results found.    Assessment and Plan: 43 y.o. female with   .Robynne was seen today for establish care.  Diagnoses and all orders for this visit:  Encounter to establish care  Hypertension goal BP (blood pressure) < 130/80 -     COMPLETE METABOLIC PANEL WITH GFR -     hydrochlorothiazide (HYDRODIURIL) 12.5 MG tablet; Take 1 tablet (12.5 mg total) by mouth daily.  Retinopathy  Iron deficiency -     Cancel: CBC -     Fe+TIBC+Fer  Rash and nonspecific skin eruption -     CBC with Differential/Platelet -     doxycycline (VIBRA-TABS) 100 MG tablet; Take 1 tablet (100 mg total) by mouth 2 (two) times daily for 7 days.  Screening for lipid disorders -     Lipid Panel w/reflex Direct LDL  Screening for diabetes mellitus -     COMPLETE METABOLIC PANEL WITH GFR  Screening for blood disease -     Cancel: CBC  Status post tubal ligation  Family history of colon cancer requiring screening colonoscopy -     Ambulatory referral to Gastroenterology  History of varicose veins of lower extremity  Other orders -     PLATELET ESTIMATION -     CBC MORPHOLOGY   - Personally reviewed PMH, PSH, PFH, medications, allergies, HM - Age-appropriate cancer screening: overdue for Pap, mammogram ordered, referral placed for colonosocpy (fam hx in 1st degree relative) - Influenza declined - Tdap unknown - PHQ2 negative  Rash - 1+ peripheral edema of bilateral extremities, anterior distal lower extremity there is a rectangular, red patch that is tender. Will begin treatment for cellulitis. Differential also includes venous stasis dermatitis. With her hx of travel to [place] there may also be a  parasitic infection. CBC pending. Follow-up in 1 week. If no improvement in rash after empiric antibiotics, will punch biopsy  HTN - CMP pending. Starting HCTZ 12.5 mg QD, which may help with peripheral edema. Counseled on therapeutic lifestyle changes.  Retinopathy - keep f/u appt with Retina specialist. Report requested from [facility] in [place]  Iron deficiency - no prior CBC in chart, but iron level 31 in 2014. Patient endorses menorrhagia.  Patient education and anticipatory guidance given Patient agrees with treatment plan Follow-up in 1 week for rash or sooner as needed if symptoms worsen or fail to improve  Darlyne Russian PA-C

## 2018-02-21 NOTE — Patient Instructions (Addendum)
For your blood pressure: - Goal <130/80 (Ideally 120's/70's) - take your blood pressure medication in the morning (unless instructed differently) - take baby aspirin 81 mg daily to help prevent heart attack/stroke - monitor and log blood pressures at home - check around the same time each day in a relaxed setting - Limit salt to <2500 mg/day - Follow DASH (Dietary Approach to Stopping Hypertension) eating plan - Try to get at least 150 minutes of aerobic exercise per week - Aim to go on a brisk walk 30 minutes per day at least 5 days per week. If you're not active, gradually increase how long you walk by 5 minutes each week - limit alcohol: 2 standard drinks per day for men and 1 per day for women - avoid tobacco/nicotine products. Consider smoking cessation if you smoke - weight loss: 7% of current body weight can reduce your blood pressure by 5-10 points - follow-up at least every 6 months for your blood pressure. Follow-up sooner if your BP is not controlled   DASH Eating Plan DASH stands for "Dietary Approaches to Stop Hypertension." The DASH eating plan is a healthy eating plan that has been shown to reduce high blood pressure (hypertension). It may also reduce your risk for type 2 diabetes, heart disease, and stroke. The DASH eating plan may also help with weight loss. What are tips for following this plan?  General guidelines  Avoid eating more than 2,300 mg (milligrams) of salt (sodium) a day. If you have hypertension, you may need to reduce your sodium intake to 1,500 mg a day.  Limit alcohol intake to no more than 1 drink a day for nonpregnant women and 2 drinks a day for men. One drink equals 12 oz of beer, 5 oz of wine, or 1 oz of hard liquor.  Work with your health care provider to maintain a healthy body weight or to lose weight. Ask what an ideal weight is for you.  Get at least 30 minutes of exercise that causes your heart to beat faster (aerobic exercise) most days of  the week. Activities may include walking, swimming, or biking.  Work with your health care provider or diet and nutrition specialist (dietitian) to adjust your eating plan to your individual calorie needs. Reading food labels   Check food labels for the amount of sodium per serving. Choose foods with less than 5 percent of the Daily Value of sodium. Generally, foods with less than 300 mg of sodium per serving fit into this eating plan.  To find whole grains, look for the word "whole" as the first word in the ingredient list. Shopping  Buy products labeled as "low-sodium" or "no salt added."  Buy fresh foods. Avoid canned foods and premade or frozen meals. Cooking  Avoid adding salt when cooking. Use salt-free seasonings or herbs instead of table salt or sea salt. Check with your health care provider or pharmacist before using salt substitutes.  Do not fry foods. Cook foods using healthy methods such as baking, boiling, grilling, and broiling instead.  Cook with heart-healthy oils, such as olive, canola, soybean, or sunflower oil. Meal planning  Eat a balanced diet that includes: ? 5 or more servings of fruits and vegetables each day. At each meal, try to fill half of your plate with fruits and vegetables. ? Up to 6-8 servings of whole grains each day. ? Less than 6 oz of lean meat, poultry, or fish each day. A 3-oz serving of meat is about  the same size as a deck of cards. One egg equals 1 oz. ? 2 servings of low-fat dairy each day. ? A serving of nuts, seeds, or beans 5 times each week. ? Heart-healthy fats. Healthy fats called Omega-3 fatty acids are found in foods such as flaxseeds and coldwater fish, like sardines, salmon, and mackerel.  Limit how much you eat of the following: ? Canned or prepackaged foods. ? Food that is high in trans fat, such as fried foods. ? Food that is high in saturated fat, such as fatty meat. ? Sweets, desserts, sugary drinks, and other foods with  added sugar. ? Full-fat dairy products.  Do not salt foods before eating.  Try to eat at least 2 vegetarian meals each week.  Eat more home-cooked food and less restaurant, buffet, and fast food.  When eating at a restaurant, ask that your food be prepared with less salt or no salt, if possible. What foods are recommended? The items listed may not be a complete list. Talk with your dietitian about what dietary choices are best for you. Grains Whole-grain or whole-wheat bread. Whole-grain or whole-wheat pasta. Brown rice. [place] Morrow. Bulgur. Whole-grain and low-sodium cereals. Pita bread. Low-fat, low-sodium crackers. Whole-wheat flour tortillas. Vegetables Fresh or frozen vegetables (raw, steamed, roasted, or grilled). Low-sodium or reduced-sodium tomato and vegetable juice. Low-sodium or reduced-sodium tomato sauce and tomato paste. Low-sodium or reduced-sodium canned vegetables. Fruits All fresh, dried, or frozen fruit. Canned fruit in natural juice (without added sugar). Meat and other protein foods Skinless chicken or [place]. Ground chicken or [place]. Pork with fat trimmed off. Fish and seafood. Egg whites. Dried beans, peas, or lentils. Unsalted nuts, nut butters, and seeds. Unsalted canned beans. Lean cuts of beef with fat trimmed off. Low-sodium, lean deli meat. Dairy Low-fat (1%) or fat-free (skim) milk. Fat-free, low-fat, or reduced-fat cheeses. Nonfat, low-sodium ricotta or cottage cheese. Low-fat or nonfat yogurt. Low-fat, low-sodium cheese. Fats and oils Soft margarine without trans fats. Vegetable oil. Low-fat, reduced-fat, or light mayonnaise and salad dressings (reduced-sodium). Canola, safflower, olive, soybean, and sunflower oils. Avocado. Seasoning and other foods Herbs. Spices. Seasoning mixes without salt. Unsalted popcorn and pretzels. Fat-free sweets. What foods are not recommended? The items listed may not be a complete list. Talk with your dietitian about what  dietary choices are best for you. Grains Baked goods made with fat, such as croissants, muffins, or some breads. Dry pasta or rice meal packs. Vegetables Creamed or fried vegetables. Vegetables in a cheese sauce. Regular canned vegetables (not low-sodium or reduced-sodium). Regular canned tomato sauce and paste (not low-sodium or reduced-sodium). Regular tomato and vegetable juice (not low-sodium or reduced-sodium). Angie Fava. Olives. Fruits Canned fruit in a light or heavy syrup. Fried fruit. Fruit in cream or butter sauce. Meat and other protein foods Fatty cuts of meat. Ribs. Fried meat. Berniece Salines. Sausage. Bologna and other processed lunch meats. Salami. Fatback. Hotdogs. Bratwurst. Salted nuts and seeds. Canned beans with added salt. Canned or smoked fish. Whole eggs or egg yolks. Chicken or [place] with skin. Dairy Whole or 2% milk, cream, and half-and-half. Whole or full-fat cream cheese. Whole-fat or sweetened yogurt. Full-fat cheese. Nondairy creamers. Whipped toppings. Processed cheese and cheese spreads. Fats and oils Butter. Stick margarine. Lard. Shortening. Ghee. Bacon fat. Tropical oils, such as coconut, palm kernel, or palm oil. Seasoning and other foods Salted popcorn and pretzels. Onion salt, garlic salt, seasoned salt, table salt, and sea salt. Worcestershire sauce. Tartar sauce. Barbecue sauce. Teriyaki sauce. Soy sauce,  including reduced-sodium. Steak sauce. Canned and packaged gravies. Fish sauce. Oyster sauce. Cocktail sauce. Horseradish that you find on the shelf. Ketchup. Mustard. Meat flavorings and tenderizers. Bouillon cubes. Hot sauce and Tabasco sauce. Premade or packaged marinades. Premade or packaged taco seasonings. Relishes. Regular salad dressings. Where to find more information:  National Heart, Lung, and [place]: https://wilson-eaton.com/  American Heart Association: www.heart.org Summary  The DASH eating plan is a healthy eating plan that has been shown to reduce  high blood pressure (hypertension). It may also reduce your risk for type 2 diabetes, heart disease, and stroke.  With the DASH eating plan, you should limit salt (sodium) intake to 2,300 mg a day. If you have hypertension, you may need to reduce your sodium intake to 1,500 mg a day.  When on the DASH eating plan, aim to eat more fresh fruits and vegetables, whole grains, lean proteins, low-fat dairy, and heart-healthy fats.  Work with your health care provider or diet and nutrition specialist (dietitian) to adjust your eating plan to your individual calorie needs. This information is not intended to replace advice given to you by your health care provider. Make sure you discuss any questions you have with your health care provider. Document Released: 12/24/2010 Document Revised: 12/29/2015 Document Reviewed: 12/29/2015 Elsevier Interactive Patient Education  2019 [employer].

## 2018-02-22 ENCOUNTER — Encounter: Payer: Self-pay | Admitting: Physician Assistant

## 2018-02-22 ENCOUNTER — Emergency Department (HOSPITAL_COMMUNITY): Payer: Medicaid Other

## 2018-02-22 ENCOUNTER — Encounter (HOSPITAL_COMMUNITY): Payer: Self-pay

## 2018-02-22 ENCOUNTER — Other Ambulatory Visit: Payer: Self-pay

## 2018-02-22 ENCOUNTER — Telehealth: Payer: Self-pay | Admitting: Physician Assistant

## 2018-02-22 ENCOUNTER — Ambulatory Visit (INDEPENDENT_AMBULATORY_CARE_PROVIDER_SITE_OTHER): Payer: Medicaid Other | Admitting: Physician Assistant

## 2018-02-22 ENCOUNTER — Inpatient Hospital Stay (HOSPITAL_COMMUNITY)
Admission: EM | Admit: 2018-02-22 | Discharge: 2018-03-03 | DRG: 673 | Disposition: A | Discharge status: Home / Self Care | Payer: Medicaid Other | Attending: Internal Medicine | Admitting: Internal Medicine

## 2018-02-22 VITALS — BP 160/84 | HR 83 | Temp 98.5°F | Wt 161.0 lb

## 2018-02-22 DIAGNOSIS — N186 End stage renal disease: Secondary | ICD-10-CM | POA: Insufficient documentation

## 2018-02-22 DIAGNOSIS — D649 Anemia, unspecified: Secondary | ICD-10-CM

## 2018-02-22 DIAGNOSIS — D500 Iron deficiency anemia secondary to blood loss (chronic): Secondary | ICD-10-CM | POA: Diagnosis not present

## 2018-02-22 DIAGNOSIS — I120 Hypertensive chronic kidney disease with stage 5 chronic kidney disease or end stage renal disease: Secondary | ICD-10-CM | POA: Diagnosis present

## 2018-02-22 DIAGNOSIS — H35039 Hypertensive retinopathy, unspecified eye: Secondary | ICD-10-CM | POA: Diagnosis present

## 2018-02-22 DIAGNOSIS — D631 Anemia in chronic kidney disease: Secondary | ICD-10-CM | POA: Diagnosis present

## 2018-02-22 DIAGNOSIS — I10 Essential (primary) hypertension: Secondary | ICD-10-CM | POA: Diagnosis present

## 2018-02-22 DIAGNOSIS — N178 Other acute kidney failure: Secondary | ICD-10-CM | POA: Diagnosis not present

## 2018-02-22 DIAGNOSIS — Z8679 Personal history of other diseases of the circulatory system: Secondary | ICD-10-CM | POA: Insufficient documentation

## 2018-02-22 DIAGNOSIS — G4700 Insomnia, unspecified: Secondary | ICD-10-CM | POA: Diagnosis present

## 2018-02-22 DIAGNOSIS — Z9114 Patient's other noncompliance with medication regimen: Secondary | ICD-10-CM | POA: Diagnosis not present

## 2018-02-22 DIAGNOSIS — E559 Vitamin D deficiency, unspecified: Secondary | ICD-10-CM | POA: Diagnosis present

## 2018-02-22 DIAGNOSIS — E611 Iron deficiency: Secondary | ICD-10-CM | POA: Diagnosis present

## 2018-02-22 DIAGNOSIS — N179 Acute kidney failure, unspecified: Secondary | ICD-10-CM

## 2018-02-22 DIAGNOSIS — N185 Chronic kidney disease, stage 5: Secondary | ICD-10-CM | POA: Diagnosis not present

## 2018-02-22 DIAGNOSIS — R609 Edema, unspecified: Secondary | ICD-10-CM | POA: Insufficient documentation

## 2018-02-22 DIAGNOSIS — Z8249 Family history of ischemic heart disease and other diseases of the circulatory system: Secondary | ICD-10-CM

## 2018-02-22 DIAGNOSIS — R197 Diarrhea, unspecified: Secondary | ICD-10-CM | POA: Diagnosis present

## 2018-02-22 DIAGNOSIS — R21 Rash and other nonspecific skin eruption: Secondary | ICD-10-CM

## 2018-02-22 DIAGNOSIS — D62 Acute posthemorrhagic anemia: Secondary | ICD-10-CM | POA: Diagnosis present

## 2018-02-22 DIAGNOSIS — Z800 Family history of malignant neoplasm of digestive organs: Secondary | ICD-10-CM | POA: Insufficient documentation

## 2018-02-22 DIAGNOSIS — Z79899 Other long term (current) drug therapy: Secondary | ICD-10-CM | POA: Diagnosis not present

## 2018-02-22 DIAGNOSIS — N920 Excessive and frequent menstruation with regular cycle: Secondary | ICD-10-CM

## 2018-02-22 DIAGNOSIS — K219 Gastro-esophageal reflux disease without esophagitis: Secondary | ICD-10-CM | POA: Diagnosis present

## 2018-02-22 DIAGNOSIS — E872 Acidosis: Secondary | ICD-10-CM | POA: Diagnosis present

## 2018-02-22 DIAGNOSIS — N19 Unspecified kidney failure: Secondary | ICD-10-CM | POA: Diagnosis present

## 2018-02-22 DIAGNOSIS — R109 Unspecified abdominal pain: Secondary | ICD-10-CM

## 2018-02-22 DIAGNOSIS — Z833 Family history of diabetes mellitus: Secondary | ICD-10-CM

## 2018-02-22 DIAGNOSIS — N2581 Secondary hyperparathyroidism of renal origin: Secondary | ICD-10-CM | POA: Diagnosis present

## 2018-02-22 DIAGNOSIS — Z01810 Encounter for preprocedural cardiovascular examination: Secondary | ICD-10-CM | POA: Diagnosis not present

## 2018-02-22 DIAGNOSIS — M7989 Other specified soft tissue disorders: Secondary | ICD-10-CM | POA: Diagnosis not present

## 2018-02-22 DIAGNOSIS — N189 Chronic kidney disease, unspecified: Secondary | ICD-10-CM

## 2018-02-22 HISTORY — DX: Rash and other nonspecific skin eruption: R21

## 2018-02-22 HISTORY — DX: Personal history of other diseases of the circulatory system: Z86.79

## 2018-02-22 HISTORY — DX: Family history of malignant neoplasm of digestive organs: Z80.0

## 2018-02-22 HISTORY — DX: Chronic kidney disease, unspecified: N18.9

## 2018-02-22 LAB — PROTEIN / CREATININE RATIO, URINE
CREATININE, URINE: 48.3 mg/dL
Protein Creatinine Ratio: 3.21 mg/mg{Cre} — ABNORMAL HIGH (ref 0.00–0.15)
Total Protein, Urine: 155 mg/dL

## 2018-02-22 LAB — CBC MORPHOLOGY

## 2018-02-22 LAB — URINALYSIS, ROUTINE W REFLEX MICROSCOPIC
Bilirubin Urine: NEGATIVE
Glucose, UA: 150 mg/dL — AB
Ketones, ur: NEGATIVE mg/dL
LEUKOCYTES UA: NEGATIVE
Nitrite: NEGATIVE
PROTEIN: 100 mg/dL — AB
Specific Gravity, Urine: 1.008 (ref 1.005–1.030)
pH: 6 (ref 5.0–8.0)

## 2018-02-22 LAB — CBC WITH DIFFERENTIAL/PLATELET
Abs Immature Granulocytes: 0.04 10*3/uL (ref 0.00–0.07)
Absolute Monocytes: 370 cells/uL (ref 200–950)
Basophils Absolute: 26 cells/uL (ref 0–200)
Basophils Absolute: 0 10*3/uL (ref 0.0–0.1)
Basophils Relative: 0.3 %
Basophils Relative: 0 %
EOS ABS: 0.3 10*3/uL (ref 0.0–0.5)
Eosinophils Absolute: 301 cells/uL (ref 15–500)
Eosinophils Relative: 3.5 %
Eosinophils Relative: 3 %
HCT: 17.3 % — ABNORMAL LOW (ref 35.0–45.0)
HCT: 18.7 % — ABNORMAL LOW (ref 36.0–46.0)
Hemoglobin: 5.7 g/dL — CL (ref 11.7–15.5)
Hemoglobin: 5.7 g/dL — CL (ref 12.0–15.0)
Immature Granulocytes: 1 %
Lymphocytes Relative: 15 %
Lymphs Abs: 1273 cells/uL (ref 850–3900)
Lymphs Abs: 1.2 10*3/uL (ref 0.7–4.0)
MCH: 26.5 pg — ABNORMAL LOW (ref 27.0–33.0)
MCH: 27 pg (ref 26.0–34.0)
MCHC: 32.9 g/dL (ref 32.0–36.0)
MCHC: 30.5 g/dL (ref 30.0–36.0)
MCV: 80.5 fL (ref 80.0–100.0)
MCV: 88.6 fL (ref 80.0–100.0)
MONOS PCT: 4 %
MPV: 9.6 fL (ref 7.5–12.5)
Monocytes Absolute: 0.3 10*3/uL (ref 0.1–1.0)
Monocytes Relative: 4.3 %
Neutro Abs: 6631 cells/uL (ref 1500–7800)
Neutro Abs: 6.2 10*3/uL (ref 1.7–7.7)
Neutrophils Relative %: 77.1 %
Neutrophils Relative %: 77 %
PLATELETS: 396 10*3/uL (ref 140–400)
Platelets: 337 10*3/uL (ref 150–400)
RBC: 2.15 10*6/uL — AB (ref 3.80–5.10)
RBC: 2.11 MIL/uL — ABNORMAL LOW (ref 3.87–5.11)
RDW: 15.9 % — ABNORMAL HIGH (ref 11.0–15.0)
RDW: 16 % — ABNORMAL HIGH (ref 11.5–15.5)
Total Lymphocyte: 14.8 %
WBC: 8.6 10*3/uL (ref 3.8–10.8)
WBC: 7.9 10*3/uL (ref 4.0–10.5)
nRBC: 0 % (ref 0.0–0.2)

## 2018-02-22 LAB — LIPID PANEL W/REFLEX DIRECT LDL
CHOL/HDL RATIO: 4.5 (calc) (ref <5.0)
Cholesterol: 181 mg/dL (ref <200)
HDL: 40 mg/dL — AB (ref >=50)
LDL CHOLESTEROL (CALC): 116 mg/dL — AB
NON-HDL CHOLESTEROL (CALC): 141 mg/dL — AB (ref <130)
Triglycerides: 136 mg/dL (ref <150)

## 2018-02-22 LAB — IRON,TIBC AND FERRITIN PANEL
%SAT: 9 % (calc) — ABNORMAL LOW (ref 16–45)
Ferritin: 22 ng/mL (ref 16–232)
Iron: 21 ug/dL — ABNORMAL LOW (ref 40–190)
TIBC: 233 ug/dL — AB (ref 250–450)

## 2018-02-22 LAB — TYPE AND SCREEN
ABO/RH(D): O NEG
Antibody Screen: NEGATIVE

## 2018-02-22 LAB — COMPREHENSIVE METABOLIC PANEL
ALT: 11 U/L (ref 0–44)
ANION GAP: 12 (ref 5–15)
AST: 7 U/L — ABNORMAL LOW (ref 15–41)
Albumin: 3.8 g/dL (ref 3.5–5.0)
Alkaline Phosphatase: 72 U/L (ref 38–126)
BUN: 92 mg/dL — ABNORMAL HIGH (ref 6–20)
CO2: 13 mmol/L — ABNORMAL LOW (ref 22–32)
Calcium: 8.9 mg/dL (ref 8.9–10.3)
Chloride: 114 mmol/L — ABNORMAL HIGH (ref 98–111)
Creatinine, Ser: 10.76 mg/dL — ABNORMAL HIGH (ref 0.44–1.00)
GFR calc Af Amer: 4 mL/min — ABNORMAL LOW (ref >60)
GFR calc non Af Amer: 4 mL/min — ABNORMAL LOW (ref >60)
Glucose, Bld: 92 mg/dL (ref 70–99)
Potassium: 4.1 mmol/L (ref 3.5–5.1)
Sodium: 139 mmol/L (ref 135–145)
Total Bilirubin: 0.2 mg/dL — ABNORMAL LOW (ref 0.3–1.2)
Total Protein: 8.2 g/dL — ABNORMAL HIGH (ref 6.5–8.1)

## 2018-02-22 LAB — I-STAT BETA HCG BLOOD, ED (MC, WL, AP ONLY): I-stat hCG, quantitative: <5 m[IU]/mL (ref <5)

## 2018-02-22 LAB — COMPLETE METABOLIC PANEL WITH GFR
AG Ratio: 1.2 (calc) (ref 1.0–2.5)
ALBUMIN MSPROF: 4.1 g/dL (ref 3.6–5.1)
ALT: 8 U/L (ref 6–29)
AST: 6 U/L — ABNORMAL LOW (ref 10–30)
Alkaline phosphatase (APISO): 74 U/L (ref 31–125)
BUN / CREAT RATIO: 8 (calc) (ref 6–22)
BUN: 85 mg/dL — AB (ref 7–25)
CALCIUM: 8.9 mg/dL (ref 8.6–10.2)
CO2: 12 mmol/L — AB (ref 20–32)
CREATININE: 10.65 mg/dL — AB (ref 0.50–1.10)
Chloride: 112 mmol/L — ABNORMAL HIGH (ref 98–110)
GFR, EST NON AFRICAN AMERICAN: 4 mL/min/{1.73_m2} — AB (ref >=60)
GFR, Est African American: 5 mL/min/{1.73_m2} — ABNORMAL LOW (ref >=60)
GLOBULIN: 3.4 g/dL (ref 1.9–3.7)
GLUCOSE: 77 mg/dL (ref 65–99)
Potassium: 4.6 mmol/L (ref 3.5–5.3)
SODIUM: 140 mmol/L (ref 135–146)
Total Bilirubin: 0.3 mg/dL (ref 0.2–1.2)
Total Protein: 7.5 g/dL (ref 6.1–8.1)

## 2018-02-22 LAB — VITAMIN B12: Vitamin B-12: 659 pg/mL (ref 180–914)

## 2018-02-22 LAB — RETICULOCYTES
Immature Retic Fract: 16.4 % — ABNORMAL HIGH (ref 2.3–15.9)
RBC.: 2.11 MIL/uL — ABNORMAL LOW (ref 3.87–5.11)
Retic Count, Absolute: 38 10*3/uL (ref 19.0–186.0)
Retic Ct Pct: 1.8 % (ref 0.4–3.1)

## 2018-02-22 LAB — NA AND K (SODIUM & POTASSIUM), RAND UR
Potassium Urine: 23 mmol/L
Sodium, Ur: 61 mmol/L

## 2018-02-22 LAB — FERRITIN: Ferritin: 18 ng/mL (ref 11–307)

## 2018-02-22 LAB — IRON AND TIBC
Iron: 21 ug/dL — ABNORMAL LOW (ref 28–170)
Saturation Ratios: 9 % — ABNORMAL LOW (ref 10.4–31.8)
TIBC: 245 ug/dL — ABNORMAL LOW (ref 250–450)
UIBC: 224 ug/dL

## 2018-02-22 LAB — HEMOGLOBIN AND HEMATOCRIT, BLOOD
HCT: 24.3 % — ABNORMAL LOW (ref 36.0–46.0)
Hemoglobin: 7.6 g/dL — ABNORMAL LOW (ref 12.0–15.0)

## 2018-02-22 LAB — PREPARE RBC (CROSSMATCH)

## 2018-02-22 LAB — FOLATE: Folate: 15.3 ng/mL (ref >5.9)

## 2018-02-22 LAB — ABO/RH
ABO/RH(D): O NEG
ABO/RH(D): O NEG

## 2018-02-22 MED ORDER — MEDROXYPROGESTERONE ACETATE 10 MG PO TABS: 20.0000 mg | ORAL_TABLET | Freq: Three times a day (TID) | ORAL | 0 refills | Status: DC

## 2018-02-22 MED ORDER — SODIUM CHLORIDE 0.9% IV SOLUTION
Freq: Once | INTRAVENOUS | Status: AC
  Administered 2018-02-22: 16:00:00 via INTRAVENOUS

## 2018-02-22 MED ORDER — SODIUM BICARBONATE 650 MG PO TABS
1300.0000 mg | ORAL_TABLET | Freq: Three times a day (TID) | ORAL | Status: DC
  Administered 2018-02-22 – 2018-02-24 (×6): 1300 mg via ORAL
  Filled 2018-02-22 (×7): qty 2

## 2018-02-22 MED ORDER — SODIUM CHLORIDE 0.9% IV SOLUTION
Freq: Once | INTRAVENOUS | Status: AC
  Administered 2018-02-22: 12:00:00 via INTRAVENOUS

## 2018-02-22 NOTE — ED Notes (Signed)
Date and time results received: 02/22/18 1047 (use smartphrase ".now" to insert current time)  Test: Hgb Critical Value: 5.7  Name of Provider Notified: Dr. Venora Maples  Orders Received? Or Actions Taken?:

## 2018-02-22 NOTE — Progress Notes (Signed)
Patient arrived to [facility] via [place]. Patient alert and oriented. Admission assessment completed. Patient updated on plan of care. [facility] notified patient has arrived on the unit. No other complaints at this time. Will continue to monitor patient.

## 2018-02-22 NOTE — H&P (Signed)
History and Physical    Ronny Ruddell DJS:[id number] DOB: 01-06-1976 DOA: 02/22/2018  I have briefly reviewed the patient's prior medical records in [facility]  PCP: [name], PA-C  Patient coming from: home  Chief Complaint: abnormal labs  HPI: Sara Meza is a 43 y.o. female with medical history significant of hypertension with otherwise healthy who presents to the hospital with chief complaint of abnormal labs as well as weakness over the last month. Patient tells me that she has had an upper respiratory infection about a month ago and generalized weakness then, it appears to have resolved in a few days but she has had persistent weakness, fatigue and increased sleepiness over the last month.  She also reports intermittent nausea and several episodes of emesis.  Also reports loss of appetite.  She denies any fever or chills, denies any chest pain, denies any shortness of breath.  No abdominal pain, no diarrhea.  She was evaluated by PCP with routine labs and was found to have a hemoglobin of 5.7 as well as a creatinine in the 10 range and was sent to the emergency room.  Patient states that she usually has heavy menses but denies any blood in the stool or dark tarry stools.  She reports bilateral lower extremity swelling more so in the last week, and feels like her skin overlying the swelling has turned a little bit red.  Also reports that the right lower extremity seems to be more swollen than the left.  She reports taking ibuprofen rarely, denies any other NSAIDs  ED Course: In the ED she is afebrile, blood pressure is a little bit on the high side in the 885O systolic, heart rate is in the 80s and she is satting well on room air.  Blood work reveals a creatinine of 10.7, BUN 92, bicarb 13.  She was found to have a hemoglobin 5.7.  Anemia panel shows iron deficiency.  Nephrology was consulted and we are asked to admit.  Review of Systems: As per HPI otherwise 10 point  review of systems negative.   Past Medical History:  Diagnosis Date  . History of gestational diabetes 06/29/2012  . Hypertension     Past Surgical History:  Procedure Laterality Date  . ENDOVENOUS ABLATION SAPHENOUS VEIN W/ LASER    . TUBAL LIGATION       reports that she has never smoked. She has never used smokeless tobacco. She reports previous alcohol use. She reports that she does not use drugs.  Allergies  Allergen Reactions  . Lisinopril Other (See Comments)    irregular menses    Family History  Problem Relation Age of Onset  . Diabetes Father   . Hypertension Father   . Diabetes Mother   . Hypertension Mother   . Colon cancer Sister   . Wilm's tumor Sister     Prior to Admission medications   Medication Sig Start Date End Date Taking? Authorizing Provider  Aromatic Inhalants (VICKS VAPOR INHALER IN) Inhale 2 puffs into the lungs daily as needed (stuffy nose).   Yes [provider]  BIOTIN PO Take 2 tablets by mouth daily.   Yes [provider]  Chlorphen-Phenyleph-APAP 2-5-325 MG TABS Take 2 tablets by mouth daily as needed (cough, headache, congestion).   Yes [provider]  doxycycline (VIBRA-TABS) 100 MG tablet Take 1 tablet (100 mg total) by mouth 2 (two) times daily for 7 days. 02/21/18 02/28/18 Yes [name], PA-C  hydrochlorothiazide (HYDRODIURIL)  12.5 MG tablet Take 1 tablet (12.5 mg total) by mouth daily. 02/21/18  Yes [name], PA-C  ibuprofen (ADVIL,MOTRIN) 200 MG tablet Take 400 mg by mouth every 6 (six) hours as needed for headache or moderate pain.   Yes [provider]  medroxyPROGESTERone (PROVERA) 10 MG tablet Take 2 tablets (20 mg total) by mouth 3 (three) times daily for 7 days. Patient not taking: Reported on 02/22/2018 02/22/18 03/01/18  [name], [place]    Physical Exam: Vitals:   02/22/18 0937 02/22/18 1005  BP: (!) 162/96 135/88  Pulse: 88 80  Resp: 15 19    Temp: 98.6 F (37 C)   TempSrc: Oral   SpO2: 100% 100%  Weight: 71.7 kg   Height: 5\' 6"  (1.676 m)     Constitutional: NAD, calm, comfortable Eyes: PERRL, lids and conjunctivae normal ENMT: Mucous membranes are moist. Posterior pharynx clear of any exudate or lesions. Neck: normal, supple, no masses, no thyromegaly Respiratory: clear to auscultation bilaterally, no wheezing, no crackles. Normal respiratory effort. N Cardiovascular: Regular rate and rhythm, soft 2/6 SEM.  1-2+ lower extremity edema. 2+ pedal pulses.  Abdomen: no tenderness, no masses palpated. Bowel sounds positive.  Musculoskeletal: no clubbing / cyanosis. Normal muscle tone.  Skin: no rashes, mild erythematous discoloration bilateral lower extremities Neurologic: CN 2-12 grossly intact. Strength 5/5 in all 4.  Psychiatric: Normal judgment and insight. Alert and oriented x 3. Normal mood.   Labs on Admission: I have personally reviewed following labs and imaging studies  CBC: Recent Labs  Lab 02/21/18 1544 02/22/18 1006  WBC 8.6 7.9  NEUTROABS 6,631 6.2  HGB 5.7* 5.7*  HCT 17.3* 18.7*  MCV 80.5 88.6  PLT 396 825   Basic Metabolic Panel: Recent Labs  Lab 02/21/18 1541 02/22/18 1006  NA 140 139  K 4.6 4.1  CL 112* 114*  CO2 12* 13*  GLUCOSE 77 92  BUN 85* 92*  CREATININE 10.65* 10.76*  CALCIUM 8.9 8.9   GFR: Estimated Creatinine Clearance: 6.8 mL/min (A) (by C-G formula based on SCr of 10.76 mg/dL (H)). Liver Function Tests: Recent Labs  Lab 02/21/18 1541 02/22/18 1006  AST 6* 7*  ALT 8 11  ALKPHOS  --  72  BILITOT 0.3 0.2*  PROT 7.5 8.2*  ALBUMIN  --  3.8   No results for input(s): LIPASE, AMYLASE in the last 168 hours. No results for input(s): AMMONIA in the last 168 hours. Coagulation Profile: No results for input(s): INR, PROTIME in the last 168 hours. Cardiac Enzymes: No results for input(s): CKTOTAL, CKMB, CKMBINDEX, TROPONINI in the last 168 hours. BNP (last 3 results) No  results for input(s): PROBNP in the last 8760 hours. HbA1C: No results for input(s): HGBA1C in the last 72 hours. CBG: No results for input(s): GLUCAP in the last 168 hours. Lipid Profile: Recent Labs    02/21/18 1541  CHOL 181  HDL 40*  LDLCALC 116*  TRIG 136  CHOLHDL 4.5   Thyroid Function Tests: No results for input(s): TSH, T4TOTAL, FREET4, T3FREE, THYROIDAB in the last 72 hours. Anemia Panel: Recent Labs    02/21/18 1541 02/22/18 1006  VITAMINB12  --  659  FERRITIN 22 18  TIBC 233* 245*  IRON 21* 21*  RETICCTPCT  --  1.8   Urine analysis: No results found for: COLORURINE, APPEARANCEUR, LABSPEC, PHURINE, GLUCOSEU, HGBUR, BILIRUBINUR, KETONESUR, PROTEINUR, UROBILINOGEN, NITRITE, LEUKOCYTESUR   Radiological Exams on Admission: No results found.  EKG: Independently reviewed.  Telemetry shows sinus rhythm, with rate of 82  Assessment/Plan Active Problems:   Renal failure   Principal Problem Acute kidney injury with non-anion gap metabolic acidosis -Patient has had progressive symptoms consistent with uremia over the last month.   I have no prior creatinines to compare.  She did report an URI type illness about a month ago when all this started -Nephrology has been consulted, discussed with Dr. Carolin Sicks and will admit patient to [facility] from [place] [facility] ER in case she will need dialysis -K is normal, will continue to monitor  -Avoid further nephrotoxins -Renal ultrasound pending  Active Problems Symptomatic anemia -In the setting of iron deficiency, as well as renal failure, along with heavy menses -Transfuse 2 units of packed red blood cells to bring hemoglobin greater than 7  Hypertension -Hold her home hydrochlorothiazide, prn hydralazine    DVT prophylaxis: SCDs  Code Status: Full code  Family Communication: No family present at bedside Disposition Plan: Admit to telemetry Consults called: Nephrology   Marzetta Board, MD, PhD [facility]  Contact via [email or web address]  [facility] Office Info P: [phone]  F: [phone]   02/22/2018, 11:34 AM

## 2018-02-22 NOTE — Patient Instructions (Addendum)
Acute Kidney Injury, Adult ° °Acute kidney injury is a sudden worsening of kidney function. The kidneys are organs that have several jobs. They filter the blood to remove waste products and extra fluid. They also maintain a healthy balance of minerals and hormones in the body, which helps control blood pressure and keep bones strong. With this condition, your kidneys do not do their jobs as well as they should. °This condition ranges from mild to severe. Over time it may develop into long-lasting (chronic) kidney disease. Early detection and treatment may prevent acute kidney injury from developing into a chronic condition. °What are the causes? °Common causes of this condition include: °· A problem with blood flow to the kidneys. This may be caused by: °? Low blood pressure (hypotension) or shock. °? Blood loss. °? Heart and blood vessel (cardiovascular) disease. °? Severe burns. °? Liver disease. °· Direct damage to the kidneys. This may be caused by: °? Certain medicines. °? A kidney infection. °? Poisoning. °? Being around or in contact with toxic substances. °? A surgical wound. °? A hard, direct hit to the kidney area. °· A sudden blockage of urine flow. This may be caused by: °? Cancer. °? Kidney stones. °? An enlarged prostate in males. °What are the signs or symptoms? °Symptoms of this condition may not be obvious until the condition becomes severe. Symptoms of this condition can include: °· Tiredness (lethargy), or difficulty staying awake. °· Nausea or vomiting. °· Swelling (edema) of the face, legs, ankles, or feet. °· Problems with urination, such as: °? Abdominal pain, or pain along the side of your stomach (flank). °? Decreased urine production. °? Decrease in the force of urine flow. °· Muscle twitches and cramps, especially in the legs. °· Confusion or trouble concentrating. °· Loss of appetite. °· Fever. °How is this diagnosed? °This condition may be diagnosed with tests, including: °· Blood  tests. °· Urine tests. °· Imaging tests. °· A test in which a sample of tissue is removed from the kidneys to be examined under a microscope (kidney biopsy). °How is this treated? °Treatment for this condition depends on the cause and how severe the condition is. In mild cases, treatment may not be needed. The kidneys may heal on their own. In more severe cases, treatment will involve: °· Treating the cause of the kidney injury. This may involve changing any medicines you are taking or adjusting your dosage. °· Fluids. You may need specialized IV fluids to balance your body's needs. °· Having a catheter placed to drain urine and prevent blockages. °· Preventing problems from occurring. This may mean avoiding certain medicines or procedures that can cause further injury to the kidneys. °In some cases treatment may also require: °· A procedure to remove toxic wastes from the body (dialysis or continuous renal replacement therapy - CRRT). °· Surgery. This may be done to repair a torn kidney, or to remove the blockage from the urinary system. °Follow these instructions at home: °Medicines °· Take over-the-counter and prescription medicines only as told by your health care provider. °· Do not take any new medicines without your health care provider's approval. Many medicines can worsen your kidney damage. °· Do not take any vitamin and mineral supplements without your health care provider's approval. Many nutritional supplements can worsen your kidney damage. °Lifestyle °· If your health care provider prescribed changes to your diet, follow them. You may need to decrease the amount of protein you eat. °· Achieve and maintain a healthy   weight. If you need help with this, ask your health care provider. °· Start or continue an exercise plan. Try to exercise at least 30 minutes a day, 5 days a week. °· Do not use any tobacco products, such as cigarettes, chewing tobacco, and e-cigarettes. If you need help quitting, ask your  health care provider. °General instructions °· Keep track of your blood pressure. Report changes in your blood pressure as told by your health care provider. °· Stay up to date with immunizations. Ask your health care provider which immunizations you need. °· Keep all follow-up visits as told by your health care provider. This is important. °Where to find more information °· American Association of Kidney Patients: www.aakp.org °· National Kidney Foundation: www.kidney.org °· American Kidney Fund: www.akfinc.org °· Life Options Rehabilitation Program: °? www.lifeoptions.org °? www.kidneyschool.org °Contact a health care provider if: °· Your symptoms get worse. °· You develop new symptoms. °Get help right away if: °· You develop symptoms of worsening kidney disease, which include: °? Headaches. °? Abnormally dark or light skin. °? Easy bruising. °? Frequent hiccups. °? Chest pain. °? Shortness of breath. °? End of menstruation in women. °? Seizures. °? Confusion or altered mental status. °? Abdominal or back pain. °? Itchiness. °· You have a fever. °· Your body is producing less urine. °· You have pain or bleeding when you urinate. °Summary °· Acute kidney injury is a sudden worsening of kidney function. °· Acute kidney injury can be caused by problems with blood flow to the kidneys, direct damage to the kidneys, and sudden blockage of urine flow. °· Symptoms of this condition may not be obvious until it becomes severe. Symptoms may include edema, lethargy, confusion, nausea or vomiting, and problems passing urine. °· This condition can usually be diagnosed with blood tests, urine tests, and imaging tests. Sometimes a kidney biopsy is done to diagnose this condition. °· Treatment for this condition often involves treating the underlying cause. It is treated with fluids, medicines, dialysis, diet changes, or surgery. °This information is not intended to replace advice given to you by your health care provider. Make  sure you discuss any questions you have with your health care provider. °Document Released: 07/20/2010 Document Revised: 05/06/2016 Document Reviewed: 12/26/2015 °Elsevier Interactive Patient Education © 2019 [employer] Inc. ° °

## 2018-02-22 NOTE — ED Provider Notes (Signed)
[place] DEPT Provider Note   CSN: [id number] Arrival date & time: 02/22/18  4496     History   Chief Complaint Chief Complaint  Patient presents with  . Abnormal Lab    HPI Sara Meza is a 43 y.o. female.  HPI Patient is a 43 year old female with a history of hypertension as well as gestational diabetes who presents to the emergency department from her primary care physician's office where she was found to have new acute renal failure and a hemoglobin of 5.7 yesterday.  She does have heavy menstrual cycles.  She is never required a blood transfusion before.  She is not on iron supplementation.  She is never had reported issues with her kidneys before.  She continues to make good urine.  No recent illness.  She states she is felt poorly over the past 2 to 3 months with decreased levels of energy and increasing exertional shortness of breath and generalized weakness.  No fevers.  No significant swelling at this time.  No orthopnea.  Denies chest pain and abdominal pain.   Past Medical History:  Diagnosis Date  . History of gestational diabetes 06/29/2012  . Hypertension     Patient Active Problem List   Diagnosis Date Noted  . Acute renal failure ([place]) 02/22/2018  . Family history of colon cancer requiring screening colonoscopy 02/22/2018  . Rash and nonspecific skin eruption 02/22/2018  . History of varicose veins of lower extremity 02/22/2018  . Peripheral edema 02/22/2018  . Hypertension goal BP (blood pressure) < 130/80 02/21/2018  . Iron deficiency 02/21/2018  . Status post tubal ligation 02/21/2018  . Retinopathy 02/21/2018  . Essential hypertension 12/20/2014  . Left ankle pain 08/25/2012  . History of gestational diabetes 06/29/2012  . 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 Bilateral plantar fasciitis 06/29/2012  . Restless legs 06/29/2012    Past Surgical History:  Procedure Laterality Date  . ENDOVENOUS ABLATION SAPHENOUS  VEIN W/ LASER    . TUBAL LIGATION       OB History    Gravida  3   Para  3   Term      Preterm      AB      Living        SAB      TAB      Ectopic      Multiple      Live Births               Home Medications    Prior to Admission medications   Medication Sig Start Date End Date Taking? Authorizing Provider  Aromatic Inhalants (VICKS VAPOR INHALER IN) Inhale 2 puffs into the lungs daily as needed (stuffy nose).   Yes [provider]  BIOTIN PO Take 2 tablets by mouth daily.   Yes [provider]  Chlorphen-Phenyleph-APAP 2-5-325 MG TABS Take 2 tablets by mouth daily as needed (cough, headache, congestion).   Yes [provider]  doxycycline (VIBRA-TABS) 100 MG tablet Take 1 tablet (100 mg total) by mouth 2 (two) times daily for 7 days. 02/21/18 02/28/18 Yes [name], PA-C  hydrochlorothiazide (HYDRODIURIL) 12.5 MG tablet Take 1 tablet (12.5 mg total) by mouth daily. 02/21/18  Yes [name], PA-C  ibuprofen (ADVIL,MOTRIN) 200 MG tablet Take 400 mg by mouth every 6 (six) hours as needed for headache or moderate pain.   Yes [provider]  medroxyPROGESTERone (PROVERA) 10 MG tablet Take 2 tablets (20 mg  total) by mouth 3 (three) times daily for 7 days. Patient not taking: Reported on 02/22/2018 02/22/18 03/01/18  [name], PA-C    Family History Family History  Problem Relation Age of Onset  . Diabetes Father   . Hypertension Father   . Diabetes Mother   . Hypertension Mother   . Colon cancer Sister   . Wilm's tumor Sister     Social History Social History   Tobacco Use  . Smoking status: Never Smoker  . Smokeless tobacco: Never Used  Substance Use Topics  . Alcohol use: Not Currently  . Drug use: Never     Allergies   Lisinopril   Review of Systems Review of Systems  All other systems reviewed and are negative.    Physical Exam Updated Vital Signs BP  135/88   Pulse 80   Temp 98.6 F (37 C) (Oral)   Resp 19   Ht 5\' 6"  (1.676 m)   Wt 71.7 kg   LMP 01/22/2018   SpO2 100%   BMI 25.50 kg/m   Physical Exam Vitals signs and nursing note reviewed.  Constitutional:      General: She is not in acute distress.    Appearance: She is well-developed.  HENT:     Head: Normocephalic and atraumatic.  Neck:     Musculoskeletal: Normal range of motion.  Cardiovascular:     Rate and Rhythm: Normal rate and regular rhythm.     Heart sounds: Normal heart sounds.  Pulmonary:     Effort: Pulmonary effort is normal.     Breath sounds: Normal breath sounds.  Abdominal:     General: There is no distension.     Palpations: Abdomen is soft.     Tenderness: There is no abdominal tenderness.  Musculoskeletal: Normal range of motion.  Skin:    General: Skin is warm and dry.  Neurological:     Mental Status: She is alert and oriented to person, place, and time.  Psychiatric:        Judgment: Judgment normal.      ED Treatments / Results  Labs (all labs ordered are listed, but only abnormal results are displayed) Labs Reviewed  CBC WITH DIFFERENTIAL/PLATELET - Abnormal; Notable for the following components:      Result Value   RBC 2.11 (*)    Hemoglobin 5.7 (*)    HCT 18.7 (*)    RDW 16.0 (*)    All other components within normal limits  COMPREHENSIVE METABOLIC PANEL - Abnormal; Notable for the following components:   Chloride 114 (*)    CO2 13 (*)    BUN 92 (*)    Creatinine, Ser 10.76 (*)    Total Protein 8.2 (*)    AST 7 (*)    Total Bilirubin 0.2 (*)    GFR calc non Af Amer 4 (*)    GFR calc Af Amer 4 (*)    All other components within normal limits  RETICULOCYTES - Abnormal; Notable for the following components:   RBC. 2.11 (*)    Immature Retic Fract 16.4 (*)    All other components within normal limits  VITAMIN B12  FOLATE  IRON AND TIBC  FERRITIN  URINALYSIS, ROUTINE W REFLEX MICROSCOPIC  I-STAT BETA HCG BLOOD, ED  ([facility], [facility], [facility] ONLY)  TYPE AND SCREEN  PREPARE RBC (CROSSMATCH)    EKG None  Radiology No results found.  Procedures .Critical Care Performed by: Jola Schmidt, MD Authorized by: Jola Schmidt,  MD   Critical care provider statement:    Critical care time (minutes):  32   Critical care was time spent personally by me on the following activities:  Discussions with consultants, evaluation of patient's response to treatment, examination of patient, ordering and performing treatments and interventions, ordering and review of laboratory studies, ordering and review of radiographic studies, pulse oximetry, re-evaluation of patient's condition, obtaining history from patient or surrogate and review of old charts   (including critical care time)  Medications Ordered in ED Medications  0.9 %  sodium chloride infusion (Manually program via Guardrails IV Fluids) (has no administration in time range)     Initial Impression / Assessment and Plan / ED Course  I have reviewed the triage vital signs and the nursing notes.  Pertinent labs & imaging results that were available during my care of the patient were reviewed by me and considered in my medical decision making (see chart for details).     Acute renal failure with a creatinine of 10 and a bicarb of 13.  This would be a new diagnosis for the patient.  No clear etiology is evident at this time.  Patient will undergo renal ultrasound.  She will need consultation by nephrology.  This may be the cause of her new symptomatic anemia with a hemoglobin of 5.7.  She will require blood transfusion at this time.  Hemoccult testing pending in the ER.  Hospitalist admission.  This likely has been going on for several months given the stability of her vital signs and symptoms.  Final Clinical Impressions(s) / ED Diagnoses   Final diagnoses:  Acute renal failure, unspecified acute renal failure type [facility])  Symptomatic anemia    ED Discharge Orders     None       Jola Schmidt, MD 02/22/18 1102

## 2018-02-22 NOTE — Telephone Encounter (Signed)
Left a message for a return call.

## 2018-02-22 NOTE — Consult Note (Signed)
[facility] Nephrology Consultation Note  Requesting MD: Dirk Dress ER and Dr. Cruzita Lederer Reason for consult: AKI  HPI:  Sara Meza is a 43 y.o. female.  With past medical history of hypertension for about 3 years, supposed to be on HCTZ but not taking, menorrhagia, referred by PCP to the ER for the management of severe anemia and elevated serum creatinine level found on routine lab.   Patient reported that she was initially  seen by ophthalmologist for blurry vision when she was diagnosed with hypertensive retinopathy.  Subsequently she saw her PCP yesterday when she was found to have hemoglobin around 5 and creatinine level 10 therefore patient was directed to ER for further evaluation.  Patient reported that she did not have lab done prior to this and was not taking any prescribed medication.  She was taking ibuprofen occasionally for headache.  She noticed lower extremity swelling from the beginning of this year.  She denied headache, nausea, vomiting, chest pain, shortness of breath, dysuria, urgency or frequency. Her sister was diagnosed with Wilms tumor when she was 45 years old.  No other family history of kidney disease.  She is married.  In the ER, blood pressure was 160/84, heart rate 88, 10.76, potassium 4.1, CO2 13, hemoglobin 5.7.  Kidney ultrasound showed bilateral atrophy kidney with increased cortical echogenicity.  UA with protein to glucose but no RBC or WBC.  Patient is receiving red blood cell transfusion.  Admitted for further evaluation.  Creat  Date/Time Value Ref Range Status  02/21/2018 03:41 PM 10.65 (H) 0.50 - 1.10 mg/dL Final    Comment:    Verified by repeat analysis. .    Creatinine, Ser  Date/Time Value Ref Range Status  02/22/2018 10:06 AM 10.76 (H) 0.44 - 1.00 mg/dL Final     PMHx:   Past Medical History:  Diagnosis Date  . History of gestational diabetes 06/29/2012  . Hypertension     Past Surgical History:  Procedure Laterality Date  .  ENDOVENOUS ABLATION SAPHENOUS VEIN W/ LASER    . TUBAL LIGATION      Family Hx:  Family History  Problem Relation Age of Onset  . Diabetes Father   . Hypertension Father   . Diabetes Mother   . Hypertension Mother   . Colon cancer Sister   . Wilm's tumor Sister     Social History:  reports that she has never smoked. She has never used smokeless tobacco. She reports previous alcohol use. She reports that she does not use drugs.  Allergies:  Allergies  Allergen Reactions  . Lisinopril Other (See Comments)    irregular menses    Medications: Prior to Admission medications   Medication Sig Start Date End Date Taking? Authorizing Provider  Aromatic Inhalants (VICKS VAPOR INHALER IN) Inhale 2 puffs into the lungs daily as needed (stuffy nose).   Yes [provider]  BIOTIN PO Take 2 tablets by mouth daily.   Yes [provider]  Chlorphen-Phenyleph-APAP 2-5-325 MG TABS Take 2 tablets by mouth daily as needed (cough, headache, congestion).   Yes [provider]  doxycycline (VIBRA-TABS) 100 MG tablet Take 1 tablet (100 mg total) by mouth 2 (two) times daily for 7 days. 02/21/18 02/28/18 Yes [name], PA-C  hydrochlorothiazide (HYDRODIURIL) 12.5 MG tablet Take 1 tablet (12.5 mg total) by mouth daily. 02/21/18  Yes [name], PA-C  ibuprofen (ADVIL,MOTRIN) 200 MG tablet Take 400 mg by mouth every 6 (six) hours as needed for  headache or moderate pain.   Yes [provider]  medroxyPROGESTERone (PROVERA) 10 MG tablet Take 2 tablets (20 mg total) by mouth 3 (three) times daily for 7 days. Patient not taking: Reported on 02/22/2018 02/22/18 03/01/18  [name], PA-C    I have reviewed the patient's current medications.  Labs:  Results for orders placed or performed during the hospital encounter of 02/22/18 (from the past 48 hour(s))  CBC with Differential/Platelet     Status: Abnormal   Collection Time:  02/22/18 10:06 AM  Result Value Ref Range   WBC 7.9 4.0 - 10.5 K/uL   RBC 2.11 (L) 3.87 - 5.11 MIL/uL   Hemoglobin 5.7 (LL) 12.0 - 15.0 g/dL    Comment: This critical result has verified and been called to [facility], T. RN by POTEAT,SHANNON on 02 05 2020 at 1046, and has been read back. CRITICAL RESULT VERIFIED   HCT 18.7 (L) 36.0 - 46.0 %   MCV 88.6 80.0 - 100.0 fL   MCH 27.0 26.0 - 34.0 pg   MCHC 30.5 30.0 - 36.0 g/dL   RDW 16.0 (H) 11.5 - 15.5 %   Platelets 337 150 - 400 K/uL   nRBC 0.0 0.0 - 0.2 %   Neutrophils Relative % 77 %   Neutro Abs 6.2 1.7 - 7.7 K/uL   Lymphocytes Relative 15 %   Lymphs Abs 1.2 0.7 - 4.0 K/uL   Monocytes Relative 4 %   Monocytes Absolute 0.3 0.1 - 1.0 K/uL   Eosinophils Relative 3 %   Eosinophils Absolute 0.3 0.0 - 0.5 K/uL   Basophils Relative 0 %   Basophils Absolute 0.0 0.0 - 0.1 K/uL   Immature Granulocytes 1 %   Abs Immature Granulocytes 0.04 0.00 - 0.07 K/uL    Comment: Performed at [facility], [place] [address] [place], [place] [address]  Comprehensive metabolic panel     Status: Abnormal   Collection Time: 02/22/18 10:06 AM  Result Value Ref Range   Sodium 139 135 - 145 mmol/L   Potassium 4.1 3.5 - 5.1 mmol/L   Chloride 114 (H) 98 - 111 mmol/L   CO2 13 (L) 22 - 32 mmol/L   Glucose, Bld 92 70 - 99 mg/dL   BUN 92 (H) 6 - 20 mg/dL   Creatinine, Ser 10.76 (H) 0.44 - 1.00 mg/dL   Calcium 8.9 8.9 - 10.3 mg/dL   Total Protein 8.2 (H) 6.5 - 8.1 g/dL   Albumin 3.8 3.5 - 5.0 g/dL   AST 7 (L) 15 - 41 U/L   ALT 11 0 - 44 U/L   Alkaline Phosphatase 72 38 - 126 U/L   Total Bilirubin 0.2 (L) 0.3 - 1.2 mg/dL   GFR calc non Af Amer 4 (L) >60 mL/min   GFR calc Af Amer 4 (L) >60 mL/min   Anion gap 12 5 - 15    Comment: Performed at [facility], [place] [address] [place], [place] [address]  Type and screen [place]     Status: None (Preliminary result)   Collection Time: 02/22/18 10:06 AM  Result  Value Ref Range   ABO/RH(D) O NEG    Antibody Screen NEG    Sample Expiration 02/25/2018    Unit Number [id number]    Blood Component Type RED CELLS,LR    Unit division 00    Status of Unit ISSUED    Transfusion Status OK TO TRANSFUSE    Crossmatch Result  Compatible Performed at [facility], [place] [address], [place], [place] [address]    Unit Number [id number]    Blood Component Type RED CELLS,LR    Unit division 00    Status of Unit ALLOCATED    Transfusion Status OK TO TRANSFUSE    Crossmatch Result Compatible   Vitamin B12     Status: None   Collection Time: 02/22/18 10:06 AM  Result Value Ref Range   Vitamin B-12 659 180 - 914 pg/mL    Comment: (NOTE) This assay is not validated for testing neonatal or myeloproliferative syndrome specimens for Vitamin B12 levels. Performed at [facility], [place] [address], [place], [place] [address]   Iron and TIBC     Status: Abnormal   Collection Time: 02/22/18 10:06 AM  Result Value Ref Range   Iron 21 (L) 28 - 170 ug/dL   TIBC 245 (L) 250 - 450 ug/dL   Saturation Ratios 9 (L) 10.4 - 31.8 %   UIBC 224 ug/dL    Comment: Performed at [facility], [place] [address], [place], [place] [address]  Ferritin     Status: None   Collection Time: 02/22/18 10:06 AM  Result Value Ref Range   Ferritin 18 11 - 307 ng/mL    Comment: Performed at [facility], [place] [address] [place], [place] [address]  Reticulocytes     Status: Abnormal   Collection Time: 02/22/18 10:06 AM  Result Value Ref Range   Retic Ct Pct 1.8 0.4 - 3.1 %   RBC. 2.11 (L) 3.87 - 5.11 MIL/uL   Retic Count, Absolute 38.0 19.0 - 186.0 K/uL   Immature Retic Fract 16.4 (H) 2.3 - 15.9 %    Comment: Performed at [facility], [place] [address], [place], [place] [address]  ABO/Rh     Status: None   Collection Time: 02/22/18 10:06 AM  Result Value Ref Range   ABO/RH(D)      Jenetta Downer  NEG Performed at [place] [address], [place], [place] [address]   Folate     Status: None   Collection Time: 02/22/18 10:07 AM  Result Value Ref Range   Folate 15.3 >5.9 ng/mL    Comment: RESULTS CONFIRMED BY MANUAL DILUTION Performed at [place] [address] [place], [place] [address]   I-Stat Beta hCG blood, ED ([facility], [facility], [facility] only)     Status: None   Collection Time: 02/22/18 10:17 AM  Result Value Ref Range   I-stat hCG, quantitative <5.0 <5 mIU/mL   Comment 3            Comment:   GEST. AGE      CONC.  (mIU/mL)   <=1 WEEK        5 - 50     2 WEEKS       50 - 500     3 WEEKS       100 - 10,000     4 WEEKS     1,000 - 30,000        FEMALE AND NON-PREGNANT FEMALE:     LESS THAN 5 mIU/mL   Prepare RBC     Status: None   Collection Time: 02/22/18 11:30 AM  Result Value Ref Range   Order Confirmation      ORDER PROCESSED BY BLOOD BANK Performed at [facility], [place] [address] [place], [place] [address]  Urinalysis, Routine w reflex microscopic     Status: Abnormal   Collection Time: 02/22/18 12:06 PM  Result Value Ref Range   Color, Urine STRAW (A) YELLOW   APPearance CLEAR CLEAR   Specific Gravity, Urine 1.008 1.005 - 1.030   pH 6.0 5.0 - 8.0   Glucose, UA 150 (A) NEGATIVE mg/dL   Hgb urine dipstick SMALL (A) NEGATIVE   Bilirubin Urine NEGATIVE NEGATIVE   Ketones, ur NEGATIVE NEGATIVE mg/dL   Protein, ur 100 (A) NEGATIVE mg/dL   Nitrite NEGATIVE NEGATIVE   Leukocytes, UA NEGATIVE NEGATIVE   RBC / HPF 0-5 0 - 5 RBC/hpf   WBC, UA 0-5 0 - 5 WBC/hpf   Bacteria, UA RARE (A) NONE SEEN   Squamous Epithelial / LPF 0-5 0 - 5    Comment: Performed at [facility], [place] [address] [place], [place] [address]     ROS:  Pertinent items noted in HPI and remainder of comprehensive ROS otherwise negative.  Physical Exam: Vitals:   02/22/18 1315 02/22/18 1331  BP: (!) 145/84 (!) 143/84   Pulse: 80 82  Resp: 15 17  Temp:    SpO2: 100% 100%     General exam: Appears calm and comfortable  Respiratory system: Clear to auscultation. Respiratory effort normal. No wheezing or crackle Cardiovascular system: S1 & S2 heard, RRR, no rub. b/l pedal edema + and erythema. Gastrointestinal system: Abdomen is nondistended, soft and nontender. Normal bowel sounds heard. Central nervous system: Alert and oriented. No focal neurological deficits. Extremities: Symmetric 5 x 5 power. Skin: No rashes, lesions or ulcers Psychiatry: Judgement and insight appear normal. Mood & affect appropriate.   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  Ultrasound kidneys with bilateral atrophic kidney and increased cortical echogenicity.  She has hypertensive retinopathy and was not taking antihypertensive medications.  Urinalysis with no RBC, no WBC however has some glucose and protein.  She has no uremic symptoms. -Check bladder scan, urine electrolytes -Check serology for secondary causes of renal failure including protein electrophoresis, hepatitis B surface antigen, hepatitis C antibody, HIV, ANA, ANCA, protein creatinine ratio, complements. -I will check CKD labs including vitamin D and PTH level. -Strict ins and out -Avoid nephrotoxins occluding NSAIDs or IV contrast. -Agree with transferring to [facility] if she will need dialysis.  Currently no need for dialysis.  I will start sodium bicarbonate orally.  No IV fluids because of lower extremity edema.   #Anemia due to acute blood loss and CKD: Receiving 2 units of red blood cell transfusion.  Given history of menorrhagia I recommend GYN consult/pelvic ultrasound.  Continue to monitor.  #History of hypertension: She received HCTZ today morning first time.  Blood pressure is currently acceptable.  She does have lower extremity 1+ edema.   Avoid IV fluid.  Hold antihypertensive medication.  Recommend low-salt diet.  Thank you for the consult, we will continue to follow with you.   Loye Reininger Tanna Furry 02/22/2018, 1:40 PM  [employer].

## 2018-02-22 NOTE — Telephone Encounter (Signed)
Triage, Can you please call this patient? Take a hx regarding blood in stool/urine or heavy periods? Shortness of breath or palpitations? Let her know hemoglobin is extremely low and requires transfusion  Jenny Reichmann, Can you get her in with Hem/Onc this week for a transfusion?

## 2018-02-22 NOTE — ED Triage Notes (Signed)
Pt states she was at her PCP yesterday, who told her her Hgb was 5.7. Pt is also about to start menstrual cycle. Pt denies blood in her stool/urine. Pt states that she was told her kidney function was off, but she has had no issues voiding.  Pt states she was at PCP for hypertension.

## 2018-02-22 NOTE — Telephone Encounter (Signed)
Call received from on call nurse about a hemoglobin of 5.7. pt was called and voice msg left to call back. Will keep attempting. Pt needs blood transfusion and evaluation for blood loss.

## 2018-02-22 NOTE — Telephone Encounter (Signed)
FYI I did speak to patient about 6:30. She is aware that her and charley will make a plan about where she can get transfusion. She is due to start her cycle in next few days.

## 2018-02-22 NOTE — Telephone Encounter (Signed)
Please cancel her appt. She was sent to [facility] for acute renal failure

## 2018-02-22 NOTE — Progress Notes (Signed)
HPI:                                                                Sara Meza is a 43 y.o. female who presents to [facility] [place] today for follow-up lab abnormalities  Patient presented yesterday to establish care.  Physician on call received notification of critical lab values early this morning and patient was brought in to discuss these today.  Hemoglobin found to be extremely low hemoglobin 5.7 on routine labs yesterday She reports a history of menorrhagia. Cycle lasts 7-15 days with heavy bleeding the whole time.  She also has some dyspnea with exertion such as while grocery shopping and fatigue. Fatigue has been worse the last month. Denies hematuria, hematochezia, melena.   Additionally found to be in acute acute renal failure with creatinine above 10 and GFR=4.  She has no prior history of renal insufficiency, but also no prior labs for comparison.  Reports she is voiding normally.  Past Medical History:  Diagnosis Date  . History of gestational diabetes 06/29/2012  . Hypertension    Past Surgical History:  Procedure Laterality Date  . ENDOVENOUS ABLATION SAPHENOUS VEIN W/ LASER    . TUBAL LIGATION     Social History   Tobacco Use  . Smoking status: Never Smoker  . Smokeless tobacco: Never Used  Substance Use Topics  . Alcohol use: Not Currently   family history includes Colon cancer in her sister; Diabetes in her father and mother; Hypertension in her father and mother; Wilm's tumor in her sister.    ROS: negative except as noted in the HPI  Medications: Current Outpatient Medications  Medication Sig Dispense Refill  . doxycycline (VIBRA-TABS) 100 MG tablet Take 1 tablet (100 mg total) by mouth 2 (two) times daily for 7 days. 14 tablet 0  . hydrochlorothiazide (HYDRODIURIL) 12.5 MG tablet Take 1 tablet (12.5 mg total) by mouth daily. 30 tablet 3  . medroxyPROGESTERone (PROVERA) 10 MG tablet Take 2 tablets (20 mg  total) by mouth 3 (three) times daily for 7 days. 42 tablet 0   No current facility-administered medications for this visit.    Allergies  Allergen Reactions  . Lisinopril Other (See Comments)    irregular menses       Objective:  BP (!) 160/84   Pulse 83   Temp 98.5 F (36.9 C) (Oral)   Wt 161 lb (73 kg)   SpO2 100%   BMI 25.99 kg/m     Results for orders placed or performed in visit on 02/21/18 (from the past 72 hour(s))  COMPLETE METABOLIC PANEL WITH GFR     Status: Abnormal   Collection Time: 02/21/18  3:41 PM  Result Value Ref Range   Glucose, Bld 77 65 - 99 mg/dL    Comment: .            Fasting reference interval .    BUN 85 (H) 7 - 25 mg/dL   Creat 10.65 (H) 0.50 - 1.10 mg/dL    Comment: Verified by repeat analysis. .    GFR, Est Non African American 4 (L) > OR = 60 mL/min/1.54m2   GFR, Est African American 5 (L) > OR = 60 mL/min/1.41m2   BUN/Creatinine Ratio  8 6 - 22 (calc)   Sodium 140 135 - 146 mmol/L   Potassium 4.6 3.5 - 5.3 mmol/L   Chloride 112 (H) 98 - 110 mmol/L   CO2 12 (L) 20 - 32 mmol/L    Comment: Verified by repeat analysis. .    Calcium 8.9 8.6 - 10.2 mg/dL   Total Protein 7.5 6.1 - 8.1 g/dL   Albumin 4.1 3.6 - 5.1 g/dL   Globulin 3.4 1.9 - 3.7 g/dL (calc)   AG Ratio 1.2 1.0 - 2.5 (calc)   Total Bilirubin 0.3 0.2 - 1.2 mg/dL   Alkaline phosphatase (APISO) 74 31 - 125 U/L   AST 6 (L) 10 - 30 U/L   ALT 8 6 - 29 U/L  Lipid Panel w/reflex Direct LDL     Status: Abnormal   Collection Time: 02/21/18  3:41 PM  Result Value Ref Range   Cholesterol 181 <200 mg/dL   HDL 40 (L) > OR = 50 mg/dL   Triglycerides 136 <150 mg/dL   LDL Cholesterol (Calc) 116 (H) mg/dL (calc)    Comment: Reference range: <100 . Desirable range <100 mg/dL for primary prevention;   <70 mg/dL for patients with CHD or diabetic patients  with > or = 2 CHD risk factors. Marland Kitchen LDL-C is now calculated using the Martin-Hopkins  calculation, which is a validated novel  method providing  better accuracy than the Friedewald equation in the  estimation of LDL-C.  Cresenciano Genre et al. Annamaria Helling. 7619;509(32): 2061-2068  (http://education.[employer].com/faq/FAQ164)    Total CHOL/HDL Ratio 4.5 <5.0 (calc)   Non-HDL Cholesterol (Calc) 141 (H) <130 mg/dL (calc)    Comment: For patients with diabetes plus 1 major ASCVD risk  factor, treating to a non-HDL-C goal of <100 mg/dL  (LDL-C of <70 mg/dL) is considered a therapeutic  option.   Fe+TIBC+Fer     Status: Abnormal   Collection Time: 02/21/18  3:41 PM  Result Value Ref Range   Iron 21 (L) 40 - 190 mcg/dL   TIBC 233 (L) 250 - 450 mcg/dL (calc)   %SAT 9 (L) 16 - 45 % (calc)   Ferritin 22 16 - 232 ng/mL  CBC with Differential/Platelet     Status: Abnormal   Collection Time: 02/21/18  3:44 PM  Result Value Ref Range   WBC 8.6 3.8 - 10.8 Thousand/uL   RBC 2.15 (L) 3.80 - 5.10 Million/uL   Hemoglobin 5.7 (LL) 11.7 - 15.5 g/dL    Comment: Verified by repeat analysis. Marland Kitchen    HCT 17.3 (L) 35.0 - 45.0 %   MCV 80.5 80.0 - 100.0 fL   MCH 26.5 (L) 27.0 - 33.0 pg   MCHC 32.9 32.0 - 36.0 g/dL   RDW 15.9 (H) 11.0 - 15.0 %   Platelets 396 140 - 400 Thousand/uL   MPV 9.6 7.5 - 12.5 fL   Neutro Abs 6,631 1,500 - 7,800 cells/uL   Lymphs Abs 1,273 850 - 3,900 cells/uL   Absolute Monocytes 370 200 - 950 cells/uL   Eosinophils Absolute 301 15 - 500 cells/uL   Basophils Absolute 26 0 - 200 cells/uL   Neutrophils Relative % 77.1 %   Total Lymphocyte 14.8 %   Monocytes Relative 4.3 %   Eosinophils Relative 3.5 %   Basophils Relative 0.3 %  CBC MORPHOLOGY     Status: None   Collection Time: 02/21/18  3:44 PM  Result Value Ref Range   CBC MORPHOLOGY  NORMAL    Comment: Crenated red  blood cells 2 + Anisocytosis 1 + Poikilocytosis 2 +    No results found.    Assessment and Plan: 43 y.o. female with   .Diagnoses and all orders for this visit:  Acute renal failure, unspecified acute renal failure type  ([place])  Menorrhagia with regular cycle -     medroxyPROGESTERone (PROVERA) 10 MG tablet; Take 2 tablets (20 mg total) by mouth 3 (three) times daily for 7 days. (Patient not taking: Reported on 02/22/2018) -     Ambulatory referral to Obstetrics / Gynecology  Severe anemia -     Ambulatory referral to Hematology  Iron deficiency anemia due to chronic blood loss -     Ambulatory referral to Hematology    Acute renal failure Patient will need to be admitted for work-up and management.  Contacted charge nurse at [facility] and gave report that patient would be presenting there this morning. No prior history of renal insufficiency but no prior labs for comparison either She is only taken a single dose of hydrochlorothiazide 12-1/2 mg so this is not likely contributory  Menorrhagia Recommend starting Provera to abort current menstrual cycle Referral placed to GYN to discuss ablation / options for menorrhagia   Normocytic anemia Hemoglobin less than 7, she will require emergent transfusion Likely due to chronic blood loss from menorrhagia, ARF may be contributory Referral already placed to GI for screening colonoscopy (+family hx)  Discussed all of the lab abnormalities with patient today and answered all of her questions.  She understands that she needs to present to the [facility] long emergency department as soon as possible.  Her vitals are stable and she does not require EMS transport.  Patient education and anticipatory guidance given Patient agrees with treatment plan Follow-up as needed if symptoms worsen or fail to improve  I spent 15 minutes with this patient, greater than 50% was face-to-face time counseling regarding the above diagnoses  Darlyne Russian PA-C

## 2018-02-22 NOTE — ED Notes (Signed)
Bed: DY51 Expected date:  Expected time:  Means of arrival:  Comments: tr1

## 2018-02-22 NOTE — Telephone Encounter (Signed)
Patient has an appointment this morning.

## 2018-02-22 NOTE — ED Notes (Signed)
[facility] called for transport. 

## 2018-02-23 ENCOUNTER — Inpatient Hospital Stay (HOSPITAL_COMMUNITY): Payer: Medicaid Other

## 2018-02-23 DIAGNOSIS — N185 Chronic kidney disease, stage 5: Secondary | ICD-10-CM

## 2018-02-23 DIAGNOSIS — M7989 Other specified soft tissue disorders: Secondary | ICD-10-CM

## 2018-02-23 HISTORY — DX: Chronic kidney disease, stage 5: N18.5

## 2018-02-23 LAB — CBC
HEMATOCRIT: 25.2 % — AB (ref 36.0–46.0)
Hemoglobin: 7.9 g/dL — ABNORMAL LOW (ref 12.0–15.0)
MCH: 27.7 pg (ref 26.0–34.0)
MCHC: 31.3 g/dL (ref 30.0–36.0)
MCV: 88.4 fL (ref 80.0–100.0)
Platelets: 306 10*3/uL (ref 150–400)
RBC: 2.85 MIL/uL — ABNORMAL LOW (ref 3.87–5.11)
RDW: 15.8 % — ABNORMAL HIGH (ref 11.5–15.5)
WBC: 7.1 10*3/uL (ref 4.0–10.5)
nRBC: 0 % (ref 0.0–0.2)

## 2018-02-23 LAB — PHOSPHORUS: Phosphorus: 9.4 mg/dL — ABNORMAL HIGH (ref 2.5–4.6)

## 2018-02-23 LAB — HIV ANTIBODY (ROUTINE TESTING W REFLEX)
HIV Screen 4th Generation wRfx: NONREACTIVE
HIV Screen 4th Generation wRfx: NONREACTIVE

## 2018-02-23 LAB — PROTIME-INR
INR: 1.11
Prothrombin Time: 14.2 seconds (ref 11.4–15.2)

## 2018-02-23 LAB — BASIC METABOLIC PANEL
Anion gap: 18 — ABNORMAL HIGH (ref 5–15)
BUN: 88 mg/dL — AB (ref 6–20)
CHLORIDE: 115 mmol/L — AB (ref 98–111)
CO2: 11 mmol/L — ABNORMAL LOW (ref 22–32)
Calcium: 8.8 mg/dL — ABNORMAL LOW (ref 8.9–10.3)
Creatinine, Ser: 10.57 mg/dL — ABNORMAL HIGH (ref 0.44–1.00)
GFR calc Af Amer: 5 mL/min — ABNORMAL LOW (ref >60)
GFR calc non Af Amer: 4 mL/min — ABNORMAL LOW (ref >60)
Glucose, Bld: 89 mg/dL (ref 70–99)
Potassium: 4.4 mmol/L (ref 3.5–5.1)
Sodium: 144 mmol/L (ref 135–145)

## 2018-02-23 LAB — PTH, INTACT AND CALCIUM
Calcium, Total (PTH): 8.9 mg/dL (ref 8.7–10.2)
PTH: 540 pg/mL — ABNORMAL HIGH (ref 15–65)

## 2018-02-23 LAB — VITAMIN D 25 HYDROXY (VIT D DEFICIENCY, FRACTURES): VIT D 25 HYDROXY: 12.1 ng/mL — AB (ref 30.0–100.0)

## 2018-02-23 LAB — C4 COMPLEMENT: Complement C4, Body Fluid: 32 mg/dL (ref 14–44)

## 2018-02-23 LAB — C3 COMPLEMENT: C3 Complement: 106 mg/dL (ref 82–167)

## 2018-02-23 MED ORDER — SODIUM CHLORIDE 0.9 % IV SOLN
125.0000 mg | Freq: Every day | INTRAVENOUS | Status: AC
  Administered 2018-02-23 – 2018-02-27 (×5): 125 mg via INTRAVENOUS
  Filled 2018-02-23 (×5): qty 10

## 2018-02-23 MED ORDER — CHLORHEXIDINE GLUCONATE CLOTH 2 % EX PADS: 6.0000 | MEDICATED_PAD | Freq: Every day | CUTANEOUS | Status: DC

## 2018-02-23 MED ORDER — DARBEPOETIN ALFA 60 MCG/0.3ML IJ SOSY
60.0000 ug | PREFILLED_SYRINGE | INTRAMUSCULAR | Status: DC
  Administered 2018-02-23: 60 ug via SUBCUTANEOUS
  Filled 2018-02-23: qty 0.3

## 2018-02-23 MED ORDER — CEFAZOLIN SODIUM-DEXTROSE 2-4 GM/100ML-% IV SOLN
2.0000 g | Freq: Once | INTRAVENOUS | Status: AC
  Administered 2018-02-23: 2 g via INTRAVENOUS
  Filled 2018-02-23: qty 100

## 2018-02-23 MED ORDER — FERRIC CITRATE 1 GM 210 MG(FE) PO TABS
630.0000 mg | ORAL_TABLET | Freq: Three times a day (TID) | ORAL | Status: DC
  Administered 2018-02-23 – 2018-03-03 (×18): 630 mg via ORAL
  Filled 2018-02-23 (×26): qty 3

## 2018-02-23 NOTE — Progress Notes (Signed)
[facility] NEPHROLOGY PROGRESS NOTE  Assessment/ Plan: Pt is a 43 y.o. yo female with past medical history of hypertension for about 3 years, supposed to be on HCTZ but not taking, menorrhagia, recently diagnosed hypertensive retinopathy by her ophthalmologist, referred by PCP to the ER for the management of severe anemia and elevated serum creatinine level found on routine lab.   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  Ultrasound kidneys with bilateral atrophic kidney and increased cortical echogenicity. She has hypertensive retinopathy. Urinalysis with no RBC, no WBC, PCR 3.2. -complements normal, follow up protein electrophoresis, hepatitis B surface antigen, hepatitis C antibody, HIV, ANA, ANCA, protein creatinine ratio. -PTH elevated and atrophic kidneys I doubt there is any significant renal recovery.  Patient now has nausea, insomnia, metallic taste and generalized weakness symptoms consistent of uremia. She has metabolic acidosis.  I discussed with her at length and plan to initiate dialysis tomorrow.  IR consulted for tunneled catheter.  She will need VVS consult for permanent access placement. -Continue sodium bicarbonate for acidosis.  #Anemia due to acute blood loss and CKD: Received 2 units of red blood cell transfusion.  Given history of menorrhagia I recommend GYN consult/pelvic ultrasound.   -Iron stores low therefore I ordered IV iron and aranesp.  #Secondary hyperparathyroidism: PTH 540, phosphorus 9.4.  I will start Auryxia.  Starting dialysis.  #History of hypertension: Monitor blood pressure.  Subjective: Seen and examined.  Reports generalized weakness, metallic taste, insomnia, some nausea.  Denied chest pain or shortness of breath.  No headache.  Objective Vital signs in last 24 hours: Vitals:   02/22/18 1932 02/22/18 1947 02/23/18 0020 02/23/18 0404   BP:  128/79 135/86 (!) 141/88  Pulse:  80 79 73  Resp:  13 16 16   Temp:  99.3 F (37.4 C) 98.4 F (36.9 C) 98.3 F (36.8 C)  TempSrc:  Oral Oral Oral  SpO2:  100% 100% 100%  Weight: 72.5 kg   71.4 kg  Height: 5\' 6"  (1.676 m)      Weight change:   Intake/Output Summary (Last 24 hours) at 02/23/2018 0958 Last data filed at 02/23/2018 0408 Gross per 24 hour  Intake 1058.5 ml  Output 1400 ml  Net -341.5 ml       Labs: Basic Metabolic Panel: Recent Labs  Lab 02/21/18 1541 02/22/18 1006 02/22/18 1423 02/23/18 0539  NA 140 139  --  144  K 4.6 4.1  --  4.4  CL 112* 114*  --  115*  CO2 12* 13*  --  11*  GLUCOSE 77 92  --  89  BUN 85* 92*  --  88*  CREATININE 10.65* 10.76*  --  10.57*  CALCIUM 8.9 8.9 8.9 8.8*  PHOS  --   --   --  9.4*   Liver Function Tests: Recent Labs  Lab 02/21/18 1541 02/22/18 1006  AST 6* 7*  ALT 8 11  ALKPHOS  --  72  BILITOT 0.3 0.2*  PROT 7.5 8.2*  ALBUMIN  --  3.8   No results for input(s): LIPASE, AMYLASE in the last 168 hours. No results for input(s): AMMONIA in the last 168 hours. CBC: Recent Labs  Lab 02/21/18 1544 02/22/18 1006 02/22/18 2023 02/23/18 0539  WBC 8.6 7.9  --  7.1  NEUTROABS 6,631 6.2  --   --   HGB 5.7* 5.7* 7.6* 7.9*  HCT 17.3* 18.7* 24.3*  25.2*  MCV 80.5 88.6  --  88.4  PLT 396 337  --  306   Cardiac Enzymes: No results for input(s): CKTOTAL, CKMB, CKMBINDEX, TROPONINI in the last 168 hours. CBG: No results for input(s): GLUCAP in the last 168 hours.  Iron Studies:  Recent Labs    02/22/18 1006  IRON 21*  TIBC 245*  FERRITIN 18   Studies/Results: US Renal  Result Date: 02/22/2018 CLINICAL DATA:  Acute renal failure EXAM: RENAL / URINARY TRACT ULTRASOUND COMPLETE COMPARISON:  None. FINDINGS: Right Kidney: Renal measurements: 7.35 x 3.06 x 2.94 cm = volume: 34.62 mL. There is a tiny amount of perinephric fluid adjacent to the superior pole. Diffuse increased cortical echogenicity. Left Kidney:  Renal measurements: 8.68 x 3.32 x 3.26 cm = volume: 49.19 mL. Minimal fullness of the collecting system. No gross hydronephrosis. Bladder: Appears normal for degree of bladder distention. IMPRESSION: 1. A small echogenic kidneys consistent with medical renal disease. 2. A tiny amount of fluid adjacent to the superior pole the right kidney is age indeterminate nonspecific. 3. Minimal fullness in the left collecting system of doubtful acute significance. Electronically Signed   By: Dorise Bullion III M.D   On: 02/22/2018 11:57    Medications: Infusions: . ferric gluconate (FERRLECIT/NULECIT) IV      Scheduled Medications: . darbepoetin (ARANESP) injection - NON-DIALYSIS  60 mcg Subcutaneous Q Thu-1800  . ferric citrate  630 mg Oral TID WC  . sodium bicarbonate  1,300 mg Oral TID    have reviewed scheduled and prn medications.  Physical Exam: General:NAD, comfortable Heart:RRR, s1s2 nl no rubs Lungs:clear b/l, no cracjle Abdomen:soft, Non-tender, non-distended Extremities:No edema Neurology: Alert, awake, no asterixis.   Prasad  02/23/2018,9:58 AM  LOS: 1 day

## 2018-02-23 NOTE — Progress Notes (Signed)
Bilateral lower extremity venous duplex has been completed. Preliminary results can be found in CV Proc through chart review.   02/23/18 12:19 PM Sara Meza RVT

## 2018-02-23 NOTE — Progress Notes (Signed)
Patient Demographics:    Sara Meza, is a 43 y.o. female, DOB - 04/30/1975, RCV:[id number]  Admit date - 02/22/2018   Admitting Physician Costin Karlyne Greenspan, MD  Outpatient Primary MD for the patient is [name], PA-C  LOS - 1   Chief Complaint  Patient presents with  . Abnormal Lab        Subjective:    Sara Meza today has no fevers, no emesis,  No chest pain,  Voiding well,   Assessment  & Plan :    Principal Problem:   CKD (chronic kidney disease), stage V (HCC) Active Problems:   Essential hypertension   Iron deficiency   Retinopathy   Renal failure  Brief Summary 43 year old with past medical history relevant for hypertension hypertensive retinopathy admitted on 02/22/2018 with worsening renal function, and non-anion gap metabolic acidosis-likely will be requiring hemodialysis, Bil LE Dopplers negative for DVT  Plan:- 1)AOCKD Vs Progressive CKD-----nephrology input appreciated, plan is for initiation of HD, give bicarb 1300 mg 3 times daily for non-anion gap metabolic acidosis, creatinine remains above 10, urine output adequate, renal ultrasound with bil atrophic kidneys, lab work-up for possible etiology of renal failure noted, ANCA and ANA , Viral Hep ending  2)HTN--- history of hypertensive retinopathy, stable at this time, PTA was noncompliant with antihypertensives, nephrologist advises holding off on anti-hypertensives at this time  3)Chronic Anemia -----patient with iron deficiency anemia secondary to menorrhagia and also anem hemoglobin is up to 7.9 from 5.7 after transfusion of 2 units of packed red cells ia of CKD - Aranesp and iron infusion by nephrologist  4) secondary hyperparathyroidism--- patient with PTH of 549 for fracture of 9.4, c/n Auryxia per nephrologist  Disposition/Need for in-Hospital Stay- patient unable to be discharged at this time due to  lack of renal function recovery, patient most likely need hemodialysis, awaiting IR consult for tunneled HD catheter placement vascular surgery consult for permanent access placement----then she can have hemodialysis  Code Status : Full   Family Communication:   none   Disposition Plan  : home after she has been "clipped" for outpatient hemodialysis  Consults  :  Nephrology  DVT Prophylaxis  :   - SCDs   Lab Results  Component Value Date   PLT 306 02/23/2018    Inpatient Medications  Scheduled Meds: . Chlorhexidine Gluconate Cloth  6 each Topical Q0600  . darbepoetin (ARANESP) injection - NON-DIALYSIS  60 mcg Subcutaneous Q Thu-1800  . ferric citrate  630 mg Oral TID WC  . sodium bicarbonate  1,300 mg Oral TID   Continuous Infusions: . ferric gluconate (FERRLECIT/NULECIT) IV 125 mg (02/23/18 1028)   PRN Meds:.    Anti-infectives (From admission, onward)   Start     Dose/Rate Route Frequency Ordered Stop   02/23/18 1300  ceFAZolin (ANCEF) IVPB 2g/100 mL premix     2 g 200 mL/hr over 30 Minutes Intravenous  Once 02/23/18 1157 02/23/18 1346        Objective:   Vitals:   02/22/18 1947 02/23/18 0020 02/23/18 0404 02/23/18 1014  BP: 128/79 135/86 (!) 141/88 130/72  Pulse: 80 79 73 74  Resp: 13 16 16    Temp: 99.3 F (37.4 C) 98.4 F (36.9 C) 98.3  F (36.8 C)   TempSrc: Oral Oral Oral   SpO2: 100% 100% 100% 100%  Weight:   71.4 kg   Height:        Wt Readings from Last 3 Encounters:  02/23/18 71.4 kg  02/22/18 73 kg  02/21/18 72.1 kg     Intake/Output Summary (Last 24 hours) at 02/23/2018 1347 Last data filed at 02/23/2018 1114 Gross per 24 hour  Intake 1298.5 ml  Output 1300 ml  Net -1.5 ml     Physical Exam Patient is examined daily including today on 02/23/18 , exams remain the same as of yesterday except that has changed   Gen:- Awake Alert,  no apparent distress HEENT:- [place].AT, No sclera icterus Neck-Supple Neck,No JVD,.  Lungs-  CTAB , fair  symmetrical air movement CV- S1, S2 normal, regular  Abd-  +ve B.Sounds, Abd Soft, No tenderness,    Extremity/Skin:- No  edema, pedal pulses present Psych-affect is appropriate, oriented x3 Neuro-no new focal deficits, no tremors   Data Review:   Micro Results No results found for this or any previous visit (from the past 240 hour(s)).  Radiology Reports US Renal  Result Date: 02/22/2018 CLINICAL DATA:  Acute renal failure EXAM: RENAL / URINARY TRACT ULTRASOUND COMPLETE COMPARISON:  None. FINDINGS: Right Kidney: Renal measurements: 7.35 x 3.06 x 2.94 cm = volume: 34.62 mL. There is a tiny amount of perinephric fluid adjacent to the superior pole. Diffuse increased cortical echogenicity. Left Kidney: Renal measurements: 8.68 x 3.32 x 3.26 cm = volume: 49.19 mL. Minimal fullness of the collecting system. No gross hydronephrosis. Bladder: Appears normal for degree of bladder distention. IMPRESSION: 1. A small echogenic kidneys consistent with medical renal disease. 2. A tiny amount of fluid adjacent to the superior pole the right kidney is age indeterminate nonspecific. 3. Minimal fullness in the left collecting system of doubtful acute significance. Electronically Signed   By: Dorise Bullion III M.D   On: 02/22/2018 11:57   Vas [place] Lower Extremity Venous (dvt)  Result Date: 02/23/2018  Lower Venous Study Indications: Swelling.  Performing Technologist: Oliver Hum RVT  Examination Guidelines: A complete evaluation includes B-mode imaging, spectral Doppler, color Doppler, and power Doppler as needed of all accessible portions of each vessel. Bilateral testing is considered an integral part of a complete examination. Limited examinations for reoccurring indications may be performed as noted.  Right Venous Findings: +---------+---------------+---------+-----------+----------+-------+          CompressibilityPhasicitySpontaneityPropertiesSummary  +---------+---------------+---------+-----------+----------+-------+ CFV      Full           Yes      Yes                          +---------+---------------+---------+-----------+----------+-------+ SFJ      Full                                                 +---------+---------------+---------+-----------+----------+-------+ FV Prox  Full                                                 +---------+---------------+---------+-----------+----------+-------+ FV Mid   Full                                                 +---------+---------------+---------+-----------+----------+-------+  FV DistalFull                                                 +---------+---------------+---------+-----------+----------+-------+ PFV      Full                                                 +---------+---------------+---------+-----------+----------+-------+ POP      Full           Yes      Yes                          +---------+---------------+---------+-----------+----------+-------+ PTV      Full                                                 +---------+---------------+---------+-----------+----------+-------+ PERO     Full                                                 +---------+---------------+---------+-----------+----------+-------+  Left Venous Findings: +---------+---------------+---------+-----------+----------+-------+          CompressibilityPhasicitySpontaneityPropertiesSummary +---------+---------------+---------+-----------+----------+-------+ CFV      Full           Yes      Yes                          +---------+---------------+---------+-----------+----------+-------+ SFJ      Full                                                 +---------+---------------+---------+-----------+----------+-------+ FV Prox  Full                                                  +---------+---------------+---------+-----------+----------+-------+ FV Mid   Full                                                 +---------+---------------+---------+-----------+----------+-------+ FV DistalFull                                                 +---------+---------------+---------+-----------+----------+-------+ PFV      Full                                                 +---------+---------------+---------+-----------+----------+-------+   POP      Full           Yes      Yes                          +---------+---------------+---------+-----------+----------+-------+ PTV      Full                                                 +---------+---------------+---------+-----------+----------+-------+ PERO     Full                                                 +---------+---------------+---------+-----------+----------+-------+    Summary: Right: There is no evidence of deep vein thrombosis in the lower extremity. No cystic structure found in the popliteal fossa. Left: There is no evidence of deep vein thrombosis in the lower extremity. No cystic structure found in the popliteal fossa.  *See table(s) above for measurements and observations. Electronically signed by Servando Snare MD on 02/23/2018 at 1:43:48 PM.    Final      CBC Recent Labs  Lab 02/21/18 1544 02/22/18 1006 02/22/18 2023 02/23/18 0539  WBC 8.6 7.9  --  7.1  HGB 5.7* 5.7* 7.6* 7.9*  HCT 17.3* 18.7* 24.3* 25.2*  PLT 396 337  --  306  MCV 80.5 88.6  --  88.4  MCH 26.5* 27.0  --  27.7  MCHC 32.9 30.5  --  31.3  RDW 15.9* 16.0*  --  15.8*  LYMPHSABS 1,273 1.2  --   --   MONOABS  --  0.3  --   --   EOSABS 301 0.3  --   --   BASOSABS 26 0.0  --   --     Chemistries  Recent Labs  Lab 02/21/18 1541 02/22/18 1006 02/22/18 1423 02/23/18 0539  NA 140 139  --  144  K 4.6 4.1  --  4.4  CL 112* 114*  --  115*  CO2 12* 13*  --  11*  GLUCOSE 77 92  --  89  BUN 85* 92*  --  88*    CREATININE 10.65* 10.76*  --  10.57*  CALCIUM 8.9 8.9 8.9 8.8*  AST 6* 7*  --   --   ALT 8 11  --   --   ALKPHOS  --  72  --   --   BILITOT 0.3 0.2*  --   --    ------------------------------------------------------------------------------------------------------------------ Recent Labs    02/21/18 1541  CHOL 181  HDL 40*  LDLCALC 116*  TRIG 136  CHOLHDL 4.5    Lab Results  Component Value Date   HGBA1C 5.7 11/22/2014   ------------------------------------------------------------------------------------------------------------------ No results for input(s): TSH, T4TOTAL, T3FREE, THYROIDAB in the last 72 hours.  Invalid input(s): FREET3 ------------------------------------------------------------------------------------------------------------------ Recent Labs    02/21/18 1541 02/22/18 1006 02/22/18 1007  VITAMINB12  --  659  --   FOLATE  --   --  15.3  FERRITIN 22 18  --   TIBC 233* 245*  --   IRON 21* 21*  --   RETICCTPCT  --  1.8  --     Coagulation profile Recent Labs  Lab 02/23/18 1232  INR 1.11   ----------------------------------------------------------------------------------- No results found for: BNP   Roxan Hockey M.D on 02/23/2018 at 1:47 PM  Go to [email or web address] - for contact info  [facility] - Office  [phone]

## 2018-02-23 NOTE — Consult Note (Signed)
Chief Complaint: Patient was seen in consultation today for renal failure  Referring Physician(s): Dr. Carolin Sicks  Supervising Physician: Corrie Mckusick  Patient Status: [facility] - In-pt  History of Present Illness: Sara Meza is a 43 y.o. 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  Patient admitted with severe anemia and in need of dialysis initiation.  IR consulted for tunneled dialysis catheter placement by Dr. Carolin Sicks. Patient not on blood thinners.  Regular diet today.   Past Medical History:  Diagnosis Date  . History of gestational diabetes 06/29/2012  . Hypertension     Past Surgical History:  Procedure Laterality Date  . ENDOVENOUS ABLATION SAPHENOUS VEIN W/ LASER    . TUBAL LIGATION      Allergies: Lisinopril  Medications: Prior to Admission medications   Medication Sig Start Date End Date Taking? Authorizing Provider  Aromatic Inhalants (VICKS VAPOR INHALER IN) Inhale 2 puffs into the lungs daily as needed (stuffy nose).   Yes [provider]  BIOTIN PO Take 2 tablets by mouth daily.   Yes [provider]  Chlorphen-Phenyleph-APAP 2-5-325 MG TABS Take 2 tablets by mouth daily as needed (cough, headache, congestion).   Yes [provider]  doxycycline (VIBRA-TABS) 100 MG tablet Take 1 tablet (100 mg total) by mouth 2 (two) times daily for 7 days. 02/21/18 02/28/18 Yes [name], PA-C  hydrochlorothiazide (HYDRODIURIL) 12.5 MG tablet Take 1 tablet (12.5 mg total) by mouth daily. 02/21/18  Yes [name], PA-C  ibuprofen (ADVIL,MOTRIN) 200 MG tablet Take 400 mg by mouth every 6 (six) hours as needed for headache or moderate pain.   Yes [provider]  medroxyPROGESTERone (PROVERA) 10 MG tablet Take 2 tablets (20 mg total) by mouth 3 (three) times daily for 7 days. Patient not taking: Reported on 02/22/2018 02/22/18 03/01/18  [name], PA-C     Family History  Problem Relation Age of Onset  . Diabetes Father   . Hypertension Father   . Diabetes Mother   . Hypertension Mother   . Colon cancer Sister   . Wilm's tumor Sister     Social History   Socioeconomic History  . Marital status: Married    Spouse name: Not on file  . Number of children: Not on file  . Years of education: Not on file  . Highest education level: Not on file  Occupational History  . Not on file  Social Needs  . Financial resource strain: Not on file  . Food insecurity:    Worry: Not on file    Inability: Not on file  . Transportation needs:    Medical: Not on file    Non-medical: Not on file  Tobacco Use  . Smoking status: Never Smoker  . Smokeless tobacco: Never Used  Substance and Sexual Activity  . Alcohol use: Not Currently  . Drug use: Never  . Sexual activity: Yes    Birth control/protection: Surgical, None  Lifestyle  . Physical activity:    Days per week: Not on file    Minutes per session: Not on file  . Stress: Not on file  Relationships  . Social connections:    Talks on phone: Not on file    Gets together: Not on file    Attends religious service: Not on file    Active member of club or organization: Not on file    Attends meetings of clubs or organizations: Not on  file    Relationship status: Not on file  Other Topics Concern  . Not on file  Social History Narrative  . Not on file     Review of Systems: A 12 point ROS discussed and pertinent positives are indicated in the HPI above.  All other systems are negative.  Review of Systems  Constitutional: Negative for fatigue and fever.  Respiratory: Negative for cough and shortness of breath.   Cardiovascular: Negative for chest pain.  Gastrointestinal: Negative for abdominal pain.  Musculoskeletal: Negative for back pain.  Psychiatric/Behavioral: Negative for behavioral problems and confusion.    Vital Signs: BP 130/72   Pulse 74   Temp  98.3 F (36.8 C) (Oral)   Resp 16   Ht 5\' 6"  (1.676 m)   Wt 157 lb 8 oz (71.4 kg)   LMP 01/22/2018   SpO2 100%   BMI 25.42 kg/m   Physical Exam Vitals signs and nursing note reviewed.  Cardiovascular:     Rate and Rhythm: Normal rate and regular rhythm.  Pulmonary:     Effort: Pulmonary effort is normal.     Breath sounds: Normal breath sounds.  Abdominal:     General: Abdomen is flat.     Palpations: Abdomen is soft.  Neurological:     Mental Status: She is alert.      MD Evaluation Airway: WNL Heart: WNL Abdomen: WNL Chest/ Lungs: WNL ASA  Classification: 3 Mallampati/Airway Score: One   Imaging: US Renal  Result Date: 02/22/2018 CLINICAL DATA:  Acute renal failure EXAM: RENAL / URINARY TRACT ULTRASOUND COMPLETE COMPARISON:  None. FINDINGS: Right Kidney: Renal measurements: 7.35 x 3.06 x 2.94 cm = volume: 34.62 mL. There is a tiny amount of perinephric fluid adjacent to the superior pole. Diffuse increased cortical echogenicity. Left Kidney: Renal measurements: 8.68 x 3.32 x 3.26 cm = volume: 49.19 mL. Minimal fullness of the collecting system. No gross hydronephrosis. Bladder: Appears normal for degree of bladder distention. IMPRESSION: 1. A small echogenic kidneys consistent with medical renal disease. 2. A tiny amount of fluid adjacent to the superior pole the right kidney is age indeterminate nonspecific. 3. Minimal fullness in the left collecting system of doubtful acute significance. Electronically Signed   By: Dorise Bullion III M.D   On: 02/22/2018 11:57   Vas [place] Lower Extremity Venous (dvt)  Result Date: 02/23/2018  Lower Venous Study Indications: Swelling.  Performing Technologist: Oliver Hum RVT  Examination Guidelines: A complete evaluation includes B-mode imaging, spectral Doppler, color Doppler, and power Doppler as needed of all accessible portions of each vessel. Bilateral testing is considered an integral part of a complete examination. Limited  examinations for reoccurring indications may be performed as noted.  Right Venous Findings: +---------+---------------+---------+-----------+----------+-------+          CompressibilityPhasicitySpontaneityPropertiesSummary +---------+---------------+---------+-----------+----------+-------+ CFV      Full           Yes      Yes                          +---------+---------------+---------+-----------+----------+-------+ SFJ      Full                                                 +---------+---------------+---------+-----------+----------+-------+ FV Prox  Full                                                 +---------+---------------+---------+-----------+----------+-------+  FV Mid   Full                                                 +---------+---------------+---------+-----------+----------+-------+ FV DistalFull                                                 +---------+---------------+---------+-----------+----------+-------+ PFV      Full                                                 +---------+---------------+---------+-----------+----------+-------+ POP      Full           Yes      Yes                          +---------+---------------+---------+-----------+----------+-------+ PTV      Full                                                 +---------+---------------+---------+-----------+----------+-------+ PERO     Full                                                 +---------+---------------+---------+-----------+----------+-------+  Left Venous Findings: +---------+---------------+---------+-----------+----------+-------+          CompressibilityPhasicitySpontaneityPropertiesSummary +---------+---------------+---------+-----------+----------+-------+ CFV      Full           Yes      Yes                          +---------+---------------+---------+-----------+----------+-------+ SFJ      Full                                                  +---------+---------------+---------+-----------+----------+-------+ FV Prox  Full                                                 +---------+---------------+---------+-----------+----------+-------+ FV Mid   Full                                                 +---------+---------------+---------+-----------+----------+-------+ FV DistalFull                                                 +---------+---------------+---------+-----------+----------+-------+  PFV      Full                                                 +---------+---------------+---------+-----------+----------+-------+ POP      Full           Yes      Yes                          +---------+---------------+---------+-----------+----------+-------+ PTV      Full                                                 +---------+---------------+---------+-----------+----------+-------+ PERO     Full                                                 +---------+---------------+---------+-----------+----------+-------+    Summary: Right: There is no evidence of deep vein thrombosis in the lower extremity. No cystic structure found in the popliteal fossa. Left: There is no evidence of deep vein thrombosis in the lower extremity. No cystic structure found in the popliteal fossa.  *See table(s) above for measurements and observations.    Preliminary     Labs:  CBC: Recent Labs    02/21/18 1544 02/22/18 1006 02/22/18 2023 02/23/18 0539  WBC 8.6 7.9  --  7.1  HGB 5.7* 5.7* 7.6* 7.9*  HCT 17.3* 18.7* 24.3* 25.2*  PLT 396 337  --  306    COAGS: No results for input(s): INR, APTT in the last 8760 hours.  BMP: Recent Labs    02/21/18 1541 02/22/18 1006 02/22/18 1423 02/23/18 0539  NA 140 139  --  144  K 4.6 4.1  --  4.4  CL 112* 114*  --  115*  CO2 12* 13*  --  11*  GLUCOSE 77 92  --  89  BUN 85* 92*  --  88*  CALCIUM 8.9 8.9 8.9 8.8*  CREATININE 10.65* 10.76*   --  10.57*  GFRNONAA 4* 4*  --  4*  GFRAA 5* 4*  --  5*    LIVER FUNCTION TESTS: Recent Labs    02/21/18 1541 02/22/18 1006  BILITOT 0.3 0.2*  AST 6* 7*  ALT 8 11  ALKPHOS  --  72  PROT 7.5 8.2*  ALBUMIN  --  3.8    TUMOR MARKERS: No results for input(s): AFPTM, CEA, CA199, CHROMGRNA in the last 8760 hours.  Assessment and Plan: Renal failure Patient admitted with severe anemia as a result of renal failure; in need of dialysis.  Request made for tunneled dialysis catheter.  Risks and benefits discussed with the patient including, but not limited to bleeding, infection, vascular injury, pneumothorax which may require chest tube placement, air embolism or even death  All of the patient's questions were answered, patient is agreeable to proceed. Consent signed and in chart.  Plan to proceed in IR 2/7 as schedule allows.  NPO after midnight.  INR ordered.    Thank you for this interesting consult.  I greatly enjoyed meeting Sara  Meza and look forward to participating in their care.  A copy of this report was sent to the requesting provider on this date.  Electronically Signed: Docia Barrier, PA 02/23/2018, 12:45 PM   I spent a total of 40 Minutes    in face to face in clinical consultation, greater than 50% of which was counseling/coordinating care for renal failure.

## 2018-02-24 ENCOUNTER — Inpatient Hospital Stay (HOSPITAL_COMMUNITY): Payer: Medicaid Other

## 2018-02-24 ENCOUNTER — Encounter (HOSPITAL_COMMUNITY): Payer: Self-pay | Admitting: Interventional Radiology

## 2018-02-24 HISTORY — PX: IR US GUIDE VASC ACCESS RIGHT: IMG2390

## 2018-02-24 HISTORY — PX: IR FLUORO GUIDE CV LINE RIGHT: IMG2283

## 2018-02-24 LAB — TYPE AND SCREEN
ABO/RH(D): O NEG
Antibody Screen: NEGATIVE
UNIT DIVISION: 0
Unit division: 0
Unit division: 0
Unit division: 0

## 2018-02-24 LAB — HEMOGLOBIN A1C
Hgb A1c MFr Bld: 5.7 % — ABNORMAL HIGH (ref 4.8–5.6)
Mean Plasma Glucose: 116.89 mg/dL

## 2018-02-24 LAB — CBC
HCT: 25.6 % — ABNORMAL LOW (ref 36.0–46.0)
Hemoglobin: 8 g/dL — ABNORMAL LOW (ref 12.0–15.0)
MCH: 26.8 pg (ref 26.0–34.0)
MCHC: 31.3 g/dL (ref 30.0–36.0)
MCV: 85.6 fL (ref 80.0–100.0)
PLATELETS: 285 10*3/uL (ref 150–400)
RBC: 2.99 MIL/uL — ABNORMAL LOW (ref 3.87–5.11)
RDW: 15.2 % (ref 11.5–15.5)
WBC: 7.3 10*3/uL (ref 4.0–10.5)
nRBC: 0 % (ref 0.0–0.2)

## 2018-02-24 LAB — RENAL FUNCTION PANEL
Albumin: 3.1 g/dL — ABNORMAL LOW (ref 3.5–5.0)
Anion gap: 13 (ref 5–15)
BUN: 91 mg/dL — ABNORMAL HIGH (ref 6–20)
CO2: 14 mmol/L — ABNORMAL LOW (ref 22–32)
CREATININE: 10.6 mg/dL — AB (ref 0.44–1.00)
Calcium: 8.6 mg/dL — ABNORMAL LOW (ref 8.9–10.3)
Chloride: 113 mmol/L — ABNORMAL HIGH (ref 98–111)
GFR calc Af Amer: 5 mL/min — ABNORMAL LOW (ref >60)
GFR calc non Af Amer: 4 mL/min — ABNORMAL LOW (ref >60)
Glucose, Bld: 89 mg/dL (ref 70–99)
Phosphorus: 7.9 mg/dL — ABNORMAL HIGH (ref 2.5–4.6)
Potassium: 4.2 mmol/L (ref 3.5–5.1)
Sodium: 140 mmol/L (ref 135–145)

## 2018-02-24 LAB — PROTEIN ELECTROPHORESIS, SERUM
A/G Ratio: 1 (ref 0.7–1.7)
Albumin ELP: 3.5 g/dL (ref 2.9–4.4)
Alpha-1-Globulin: 0.2 g/dL (ref 0.0–0.4)
Alpha-2-Globulin: 1.1 g/dL — ABNORMAL HIGH (ref 0.4–1.0)
Beta Globulin: 0.8 g/dL (ref 0.7–1.3)
Gamma Globulin: 1.4 g/dL (ref 0.4–1.8)
Globulin, Total: 3.5 g/dL (ref 2.2–3.9)
Total Protein ELP: 7 g/dL (ref 6.0–8.5)

## 2018-02-24 LAB — BPAM RBC
BLOOD PRODUCT EXPIRATION DATE: 202003122359
Blood Product Expiration Date: 202003092359
Blood Product Expiration Date: 202003102359
Blood Product Expiration Date: 202003122359
ISSUE DATE / TIME: 202002051223
ISSUE DATE / TIME: 202002051554
Unit Type and Rh: 9500
Unit Type and Rh: 9500
Unit Type and Rh: 9500
Unit Type and Rh: 9500

## 2018-02-24 LAB — SURGICAL PCR SCREEN
MRSA, PCR: NEGATIVE
Staphylococcus aureus: NEGATIVE

## 2018-02-24 LAB — HEPATITIS B SURFACE ANTIGEN: Hepatitis B Surface Ag: NEGATIVE

## 2018-02-24 MED ORDER — PENTAFLUOROPROP-TETRAFLUOROETH EX AERO: 1.0000 "application " | INHALATION_SPRAY | CUTANEOUS | Status: DC | PRN

## 2018-02-24 MED ORDER — FENTANYL CITRATE (PF) 100 MCG/2ML IJ SOLN
INTRAMUSCULAR | Status: AC | PRN
  Administered 2018-02-24: 25 ug via INTRAVENOUS
  Administered 2018-02-24: 50 ug via INTRAVENOUS

## 2018-02-24 MED ORDER — HEPARIN SODIUM (PORCINE) 1000 UNIT/ML DIALYSIS: 1000.0000 [IU] | INTRAMUSCULAR | Status: DC | PRN

## 2018-02-24 MED ORDER — SODIUM CHLORIDE 0.9 % IV SOLN: 100.0000 mL | INTRAVENOUS | Status: DC | PRN

## 2018-02-24 MED ORDER — MIDAZOLAM HCL 2 MG/2ML IJ SOLN
INTRAMUSCULAR | Status: AC | PRN
  Administered 2018-02-24: 1 mg via INTRAVENOUS

## 2018-02-24 MED ORDER — LIDOCAINE-PRILOCAINE 2.5-2.5 % EX CREA: 1.0000 "application " | TOPICAL_CREAM | CUTANEOUS | Status: DC | PRN

## 2018-02-24 MED ORDER — HEPARIN SODIUM (PORCINE) 1000 UNIT/ML IJ SOLN
INTRAMUSCULAR | Status: AC
  Filled 2018-02-24: qty 1

## 2018-02-24 MED ORDER — CEFAZOLIN (ANCEF) 1 G IV SOLR
INTRAVENOUS | Status: AC | PRN
  Administered 2018-02-24: 2 g

## 2018-02-24 MED ORDER — HEPARIN SODIUM (PORCINE) 1000 UNIT/ML IJ SOLN
INTRAMUSCULAR | Status: AC
  Filled 2018-02-24: qty 4

## 2018-02-24 MED ORDER — VITAMIN D (ERGOCALCIFEROL) 1.25 MG (50000 UNIT) PO CAPS
50000.0000 [IU] | ORAL_CAPSULE | ORAL | Status: DC
  Administered 2018-03-03: 50000 [IU] via ORAL
  Filled 2018-02-24 (×2): qty 1

## 2018-02-24 MED ORDER — LIDOCAINE HCL 1 % IJ SOLN
INTRAMUSCULAR | Status: AC
  Filled 2018-02-24: qty 20

## 2018-02-24 MED ORDER — MIDAZOLAM HCL 2 MG/2ML IJ SOLN
INTRAMUSCULAR | Status: AC
  Filled 2018-02-24: qty 2

## 2018-02-24 MED ORDER — LIDOCAINE HCL (PF) 1 % IJ SOLN: 5.0000 mL | INTRAMUSCULAR | Status: DC | PRN

## 2018-02-24 MED ORDER — FENTANYL CITRATE (PF) 100 MCG/2ML IJ SOLN
INTRAMUSCULAR | Status: AC
  Filled 2018-02-24: qty 2

## 2018-02-24 MED ORDER — CEFAZOLIN SODIUM-DEXTROSE 2-4 GM/100ML-% IV SOLN
INTRAVENOUS | Status: AC
  Filled 2018-02-24: qty 100

## 2018-02-24 MED ORDER — GELATIN ABSORBABLE 12-7 MM EX MISC
CUTANEOUS | Status: AC
  Filled 2018-02-24: qty 1

## 2018-02-24 MED ORDER — ALTEPLASE 2 MG IJ SOLR: 2.0000 mg | Freq: Once | INTRAMUSCULAR | Status: DC | PRN

## 2018-02-24 MED ORDER — CHLORHEXIDINE GLUCONATE CLOTH 2 % EX PADS
6.0000 | MEDICATED_PAD | Freq: Every day | CUTANEOUS | Status: DC
  Administered 2018-02-24 – 2018-03-02 (×5): 6 via TOPICAL

## 2018-02-24 MED ORDER — HEPARIN SODIUM (PORCINE) 1000 UNIT/ML IJ SOLN
3.2000 mL | Freq: Once | INTRAMUSCULAR | Status: AC
  Administered 2018-02-24: 3200 [IU] via INTRAVENOUS

## 2018-02-24 MED ORDER — CALCITRIOL 0.25 MCG PO CAPS
0.2500 ug | ORAL_CAPSULE | Freq: Every day | ORAL | Status: DC
  Administered 2018-02-25 – 2018-03-03 (×7): 0.25 ug via ORAL
  Filled 2018-02-24 (×7): qty 1

## 2018-02-24 MED ORDER — LIDOCAINE HCL (PF) 1 % IJ SOLN
INTRAMUSCULAR | Status: AC | PRN
  Administered 2018-02-24: 10 mL

## 2018-02-24 NOTE — Progress Notes (Signed)
[facility] NEPHROLOGY PROGRESS NOTE  Assessment/ Plan: Pt is a 43 y.o. yo female with past medical history of hypertension for about 3 years, supposed to be on HCTZ but not taking, menorrhagia, recently diagnosed hypertensive retinopathy by her ophthalmologist, referred by PCP to the ER for the management of severe anemia and elevated serum creatinine level found on routine lab.   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  BP readings are however in acceptable range without any antihypertensive medication. Ultrasound kidneys with bilateral atrophic kidney and increased cortical echogenicity. She has hypertensive retinopathy. Urinalysis with no RBC, no WBC, PCR 3.2.  Patient is nonoliguric. -complements, HepBsAg, HIV negative. -Follow up protein electrophoresis, hepatitis C antibody,  ANA, ANCA,  -PTH elevated and atrophic kidneys I doubt there is any significant renal recovery.  Patient  has nausea, insomnia, dysgeusia and generalized weakness symptoms consistent of uremia.  I do not think biopsy will be helpful given atrophic kidneys.  IR to place tunneled catheter today and plan for first hemodialysis today.  I have discussed with the patient and her husband again today. She will need VVS consult for permanent access placement after pending lab results.  Second dialysis tomorrow. -will need CLIP  #Anemia due to acute blood loss and CKD: Received 2 units of red blood cell transfusion.  Given history of menorrhagia I recommend GYN consult/pelvic ultrasound.   -Continue IV iron and Aranesp.  #Secondary hyperparathyroidism: PTH 540, high phosphorus.  Started Lorin Picket.  Calcitriol.  #History of hypertension: Monitor blood pressure.  Subjective: Seen and examined.  Continues to report some nausea, insomnia, generalized weakness, dysgeusia.  Her husband at bedside. Objective Vital signs in  last 24 hours: Vitals:   02/23/18 1014 02/23/18 2021 02/24/18 0031 02/24/18 0525  BP: 130/72 (!) 143/84 138/77 132/86  Pulse: 74 85 89 85  Resp:  18 18 18   Temp:  99.4 F (37.4 C) 100 F (37.8 C) 98.9 F (37.2 C)  TempSrc:  Oral Oral Oral  SpO2: 100% 99% 99% 98%  Weight:    70.8 kg  Height:       Weight change: -0.907 kg  Intake/Output Summary (Last 24 hours) at 02/24/2018 1046 Last data filed at 02/24/2018 0700 Gross per 24 hour  Intake 820 ml  Output 1550 ml  Net -730 ml       Labs: Basic Metabolic Panel: Recent Labs  Lab 02/22/18 1006 02/22/18 1423 02/23/18 0539 02/24/18 0451  NA 139  --  144 140  K 4.1  --  4.4 4.2  CL 114*  --  115* 113*  CO2 13*  --  11* 14*  GLUCOSE 92  --  89 89  BUN 92*  --  88* 91*  CREATININE 10.76*  --  10.57* 10.60*  CALCIUM 8.9 8.9 8.8* 8.6*  PHOS  --   --  9.4* 7.9*   Liver Function Tests: Recent Labs  Lab 02/21/18 1541 02/22/18 1006 02/24/18 0451  AST 6* 7*  --   ALT 8 11  --   ALKPHOS  --  72  --   BILITOT 0.3 0.2*  --   PROT 7.5 8.2*  --   ALBUMIN  --  3.8 3.1*   No results for input(s): LIPASE, AMYLASE in the last 168 hours. No results for input(s): AMMONIA in the last 168 hours. CBC: Recent Labs  Lab 02/21/18 1544 02/22/18 1006 02/22/18 2023 02/23/18 0539 02/24/18  0451  WBC 8.6 7.9  --  7.1 7.3  NEUTROABS 6,631 6.2  --   --   --   HGB 5.7* 5.7* 7.6* 7.9* 8.0*  HCT 17.3* 18.7* 24.3* 25.2* 25.6*  MCV 80.5 88.6  --  88.4 85.6  PLT 396 337  --  306 285   Cardiac Enzymes: No results for input(s): CKTOTAL, CKMB, CKMBINDEX, TROPONINI in the last 168 hours. CBG: No results for input(s): GLUCAP in the last 168 hours.  Iron Studies:  Recent Labs    02/22/18 1006  IRON 21*  TIBC 245*  FERRITIN 18   Studies/Results: US Renal  Result Date: 02/22/2018 CLINICAL DATA:  Acute renal failure EXAM: RENAL / URINARY TRACT ULTRASOUND COMPLETE COMPARISON:  None. FINDINGS: Right Kidney: Renal measurements: 7.35 x 3.06  x 2.94 cm = volume: 34.62 mL. There is a tiny amount of perinephric fluid adjacent to the superior pole. Diffuse increased cortical echogenicity. Left Kidney: Renal measurements: 8.68 x 3.32 x 3.26 cm = volume: 49.19 mL. Minimal fullness of the collecting system. No gross hydronephrosis. Bladder: Appears normal for degree of bladder distention. IMPRESSION: 1. A small echogenic kidneys consistent with medical renal disease. 2. A tiny amount of fluid adjacent to the superior pole the right kidney is age indeterminate nonspecific. 3. Minimal fullness in the left collecting system of doubtful acute significance. Electronically Signed   By: Dorise Bullion III M.D   On: 02/22/2018 11:57   Vas [place] Lower Extremity Venous (dvt)  Result Date: 02/23/2018  Lower Venous Study Indications: Swelling.  Performing Technologist: Oliver Hum RVT  Examination Guidelines: A complete evaluation includes B-mode imaging, spectral Doppler, color Doppler, and power Doppler as needed of all accessible portions of each vessel. Bilateral testing is considered an integral part of a complete examination. Limited examinations for reoccurring indications may be performed as noted.  Right Venous Findings: +---------+---------------+---------+-----------+----------+-------+          CompressibilityPhasicitySpontaneityPropertiesSummary +---------+---------------+---------+-----------+----------+-------+ CFV      Full           Yes      Yes                          +---------+---------------+---------+-----------+----------+-------+ SFJ      Full                                                 +---------+---------------+---------+-----------+----------+-------+ FV Prox  Full                                                 +---------+---------------+---------+-----------+----------+-------+ FV Mid   Full                                                  +---------+---------------+---------+-----------+----------+-------+ FV DistalFull                                                 +---------+---------------+---------+-----------+----------+-------+ PFV  Full                                                 +---------+---------------+---------+-----------+----------+-------+ POP      Full           Yes      Yes                          +---------+---------------+---------+-----------+----------+-------+ PTV      Full                                                 +---------+---------------+---------+-----------+----------+-------+ PERO     Full                                                 +---------+---------------+---------+-----------+----------+-------+  Left Venous Findings: +---------+---------------+---------+-----------+----------+-------+          CompressibilityPhasicitySpontaneityPropertiesSummary +---------+---------------+---------+-----------+----------+-------+ CFV      Full           Yes      Yes                          +---------+---------------+---------+-----------+----------+-------+ SFJ      Full                                                 +---------+---------------+---------+-----------+----------+-------+ FV Prox  Full                                                 +---------+---------------+---------+-----------+----------+-------+ FV Mid   Full                                                 +---------+---------------+---------+-----------+----------+-------+ FV DistalFull                                                 +---------+---------------+---------+-----------+----------+-------+ PFV      Full                                                 +---------+---------------+---------+-----------+----------+-------+ POP      Full           Yes      Yes                           +---------+---------------+---------+-----------+----------+-------+ PTV  Full                                                 +---------+---------------+---------+-----------+----------+-------+ PERO     Full                                                 +---------+---------------+---------+-----------+----------+-------+    Summary: Right: There is no evidence of deep vein thrombosis in the lower extremity. No cystic structure found in the popliteal fossa. Left: There is no evidence of deep vein thrombosis in the lower extremity. No cystic structure found in the popliteal fossa.  *See table(s) above for measurements and observations. Electronically signed by Servando Snare MD on 02/23/2018 at 1:43:48 PM.    Final     Medications: Infusions: . ferric gluconate (FERRLECIT/NULECIT) IV 125 mg (02/24/18 1024)    Scheduled Medications: . calcitRIOL  0.25 mcg Oral Daily  . Chlorhexidine Gluconate Cloth  6 each Topical Q0600  . darbepoetin (ARANESP) injection - NON-DIALYSIS  60 mcg Subcutaneous Q Thu-1800  . ferric citrate  630 mg Oral TID WC  . sodium bicarbonate  1,300 mg Oral TID  . Vitamin D (Ergocalciferol)  50,000 Units Oral Q7 days    have reviewed scheduled and prn medications.  Physical Exam: General: Not in distress, comfortable Heart:RRR, s1s2 nl no rubs Lungs:clear b/l, no cracjle Abdomen:soft, Non-tender, non-distended Extremities:No edema Neurology: Alert, awake, no asterixis.  Iridiana Fonner Prasad Mcgwire Dasaro 02/24/2018,10:46 AM  LOS: 2 days

## 2018-02-24 NOTE — Procedures (Signed)
Interventional Radiology Procedure Note  Procedure: Placement of a right IJ approach tunneled HD cath. 19cm placed.  Tip is positioned at the superior cavoatrial junction and catheter is ready for immediate use.  Complications: None Recommendations:  - Ok to use - Do not submerge - Routine line care   Signed,  Dulcy Fanny. Earleen Newport, DO

## 2018-02-24 NOTE — Progress Notes (Signed)
Patient Demographics:    Sara Meza, is a 43 y.o. female, DOB - [birth year], TYO:[id number]  Admit date - 02/22/2018   Admitting Physician Costin Karlyne Greenspan, MD  Outpatient Primary MD for the patient is [name], PA-C  LOS - 2   Chief Complaint  Patient presents with  . Abnormal Lab        Subjective:    Sara Meza today has no fevers, no emesis,  No chest pain,  Voiding well, husband at bedside, questions answered  Assessment  & Plan :    Principal Problem:   CKD (chronic kidney disease), stage V ([place]) Active Problems:   Essential hypertension   Iron deficiency   Retinopathy   Renal failure  Brief Summary 43 year old with past medical history relevant for hypertension hypertensive retinopathy admitted on 02/22/2018 with worsening renal function, and non-anion gap metabolic acidosis-likely will be requiring hemodialysis, Bil LE Dopplers negative for DVT  Plan:- 1)AOCKD Vs Progressive CKD-----nephrology input appreciated, plan is for initiation of HD on 02/24/18, c/n  bicarb 1300 mg 3 times daily for non-anion gap metabolic acidosis, creatinine remains above 10, urine output adequate, renal ultrasound with bil atrophic kidneys, lab work-up for possible etiology of renal failure noted,hemoglobin A1c is 5.7, HIV negative, phosphorus is elevated at 9.4, protein electrophoresis is unremarkable, hepatitis B is negative, hepatitis C is pending, complement testing reveals no complement deficiency, ANA and ANCA titers are pending.... Per nephrologist kidney biopsy may not change management or outcome.... UA with glucosuria, hematuria and proteinuria  2)HTN--- history of hypertensive retinopathy, stable at this time, PTA was noncompliant with antihypertensives, patient denies history of significant elevation of BP, was apparently told about 3 years ago that she has stage I hypertension with  systolic blood pressure in the 140s and 150s only, nephrologist advises holding off on anti-hypertensives at this time  3)Chronic Anemia -----patient with iron deficiency anemia secondary to menorrhagia and also anem hemoglobin is up to 7.9 from 5.7 after transfusion of 2 units of packed red cells ia of CKD - Aranesp and iron infusion by nephrologist  4) secondary hyperparathyroidism--- patient with PTH of 549 for fracture of 9.4, c/n Auryxia per nephrologist  5) vitamin D deficiency--- vitamin D is low at 12, replace with 50,000 units weekly  Disposition/Need for in-Hospital Stay- patient unable to be discharged at this time due to lack of renal function recovery, patient most likely need hemodialysis, awaiting IR consult for tunneled HD catheter placement vascular surgery consult for permanent access placement----then she can have hemodialysis  Code Status : Full   Family Communication:   Husband at bedside  Disposition Plan  : home after she has been "clipped" for outpatient hemodialysis  Consults  :  Nephrology  DVT Prophylaxis  :   - SCDs   Lab Results  Component Value Date   PLT 285 02/24/2018    Inpatient Medications  Scheduled Meds: . heparin      . lidocaine      . calcitRIOL  0.25 mcg Oral Daily  . Chlorhexidine Gluconate Cloth  6 each Topical Q0600  . darbepoetin (ARANESP) injection - NON-DIALYSIS  60 mcg Subcutaneous Q Thu-1800  . fentaNYL      . ferric citrate  630 mg Oral  TID WC  . midazolam      . Vitamin D (Ergocalciferol)  50,000 Units Oral Q7 days   Continuous Infusions: . ceFAZolin    . ferric gluconate (FERRLECIT/NULECIT) IV 125 mg (02/24/18 1024)   PRN Meds:.    Anti-infectives (From admission, onward)   Start     Dose/Rate Route Frequency Ordered Stop   02/24/18 1534  ceFAZolin (ANCEF) powder       As needed 02/24/18 1534     02/24/18 1518  ceFAZolin (ANCEF) 2-4 GM/100ML-% IVPB    Note to Pharmacy:  [place]   : cabinet override       02/24/18 1518 02/25/18 0329   02/23/18 1300  ceFAZolin (ANCEF) IVPB 2g/100 mL premix     2 g 200 mL/hr over 30 Minutes Intravenous  Once 02/23/18 1157 02/24/18 0038        Objective:   Vitals:   02/23/18 2021 02/24/18 0031 02/24/18 0525 02/24/18 1512  BP: (!) 143/84 138/77 132/86 (!) 152/92  Pulse: 85 89 85 76  Resp: 18 18 18 17   Temp: 99.4 F (37.4 C) 100 F (37.8 C) 98.9 F (37.2 C)   TempSrc: Oral Oral Oral   SpO2: 99% 99% 98% 100%  Weight:   70.8 kg   Height:        Wt Readings from Last 3 Encounters:  02/24/18 70.8 kg  02/22/18 73 kg  02/21/18 72.1 kg     Intake/Output Summary (Last 24 hours) at 02/24/2018 1540 Last data filed at 02/24/2018 0700 Gross per 24 hour  Intake 240 ml  Output 1550 ml  Net -1310 ml     Physical Exam Patient is examined daily including today on 02/24/18 , exams remain the same as of yesterday except that has changed   Gen:- Awake Alert,  no apparent distress HEENT:- [place].AT, No sclera icterus Neck-Supple Neck,No JVD,.  Lungs-  CTAB , fair symmetrical air movement CV- S1, S2 normal, regular  Abd-  +ve B.Sounds, Abd Soft, No tenderness,    Extremity/Skin:- No  edema, pedal pulses present Psych-affect is appropriate, oriented x3 Neuro-no new focal deficits, no tremors   Data Review:   Micro Results Recent Results (from the past 240 hour(s))  Surgical pcr screen     Status: None   Collection Time: 02/23/18 10:09 PM  Result Value Ref Range Status   MRSA, PCR NEGATIVE NEGATIVE Final   Staphylococcus aureus NEGATIVE NEGATIVE Final    Comment: (NOTE) The Xpert SA Assay (FDA approved for NASAL specimens in patients 74 years of age and older), is one component of a comprehensive surveillance program. It is not intended to diagnose infection nor to guide or monitor treatment. Performed at [facility], [place] [address], [place], [place] [address]     Radiology Reports US Renal  Result Date: 02/22/2018 CLINICAL DATA:  Acute renal  failure EXAM: RENAL / URINARY TRACT ULTRASOUND COMPLETE COMPARISON:  None. FINDINGS: Right Kidney: Renal measurements: 7.35 x 3.06 x 2.94 cm = volume: 34.62 mL. There is a tiny amount of perinephric fluid adjacent to the superior pole. Diffuse increased cortical echogenicity. Left Kidney: Renal measurements: 8.68 x 3.32 x 3.26 cm = volume: 49.19 mL. Minimal fullness of the collecting system. No gross hydronephrosis. Bladder: Appears normal for degree of bladder distention. IMPRESSION: 1. A small echogenic kidneys consistent with medical renal disease. 2. A tiny amount of fluid adjacent to the superior pole the right kidney is age indeterminate nonspecific. 3. Minimal fullness in  the left collecting system of doubtful acute significance. Electronically Signed   By: Dorise Bullion III M.D   On: 02/22/2018 11:57   Vas [place] Lower Extremity Venous (dvt)  Result Date: 02/23/2018  Lower Venous Study Indications: Swelling.  Performing Technologist: Oliver Hum RVT  Examination Guidelines: A complete evaluation includes B-mode imaging, spectral Doppler, color Doppler, and power Doppler as needed of all accessible portions of each vessel. Bilateral testing is considered an integral part of a complete examination. Limited examinations for reoccurring indications may be performed as noted.  Right Venous Findings: +---------+---------------+---------+-----------+----------+-------+          CompressibilityPhasicitySpontaneityPropertiesSummary +---------+---------------+---------+-----------+----------+-------+ CFV      Full           Yes      Yes                          +---------+---------------+---------+-----------+----------+-------+ SFJ      Full                                                 +---------+---------------+---------+-----------+----------+-------+ FV Prox  Full                                                 +---------+---------------+---------+-----------+----------+-------+  FV Mid   Full                                                 +---------+---------------+---------+-----------+----------+-------+ FV DistalFull                                                 +---------+---------------+---------+-----------+----------+-------+ PFV      Full                                                 +---------+---------------+---------+-----------+----------+-------+ POP      Full           Yes      Yes                          +---------+---------------+---------+-----------+----------+-------+ PTV      Full                                                 +---------+---------------+---------+-----------+----------+-------+ PERO     Full                                                 +---------+---------------+---------+-----------+----------+-------+  Left Venous Findings: +---------+---------------+---------+-----------+----------+-------+  CompressibilityPhasicitySpontaneityPropertiesSummary +---------+---------------+---------+-----------+----------+-------+ CFV      Full           Yes      Yes                          +---------+---------------+---------+-----------+----------+-------+ SFJ      Full                                                 +---------+---------------+---------+-----------+----------+-------+ FV Prox  Full                                                 +---------+---------------+---------+-----------+----------+-------+ FV Mid   Full                                                 +---------+---------------+---------+-----------+----------+-------+ FV DistalFull                                                 +---------+---------------+---------+-----------+----------+-------+ PFV      Full                                                 +---------+---------------+---------+-----------+----------+-------+ POP      Full           Yes      Yes                           +---------+---------------+---------+-----------+----------+-------+ PTV      Full                                                 +---------+---------------+---------+-----------+----------+-------+ PERO     Full                                                 +---------+---------------+---------+-----------+----------+-------+    Summary: Right: There is no evidence of deep vein thrombosis in the lower extremity. No cystic structure found in the popliteal fossa. Left: There is no evidence of deep vein thrombosis in the lower extremity. No cystic structure found in the popliteal fossa.  *See table(s) above for measurements and observations. Electronically signed by Servando Snare MD on 02/23/2018 at 1:43:48 PM.    Final      CBC Recent Labs  Lab 02/21/18 1544 02/22/18 1006 02/22/18 2023 02/23/18 0539 02/24/18 0451  WBC 8.6 7.9  --  7.1 7.3  HGB 5.7* 5.7* 7.6* 7.9* 8.0*  HCT 17.3* 18.7* 24.3* 25.2* 25.6*  PLT 396 337  --  306 285  MCV 80.5 88.6  --  88.4 85.6  MCH 26.5* 27.0  --  27.7 26.8  MCHC 32.9 30.5  --  31.3 31.3  RDW 15.9* 16.0*  --  15.8* 15.2  LYMPHSABS 1,273 1.2  --   --   --   MONOABS  --  0.3  --   --   --   EOSABS 301 0.3  --   --   --   BASOSABS 26 0.0  --   --   --     Chemistries  Recent Labs  Lab 02/21/18 1541 02/22/18 1006 02/22/18 1423 02/23/18 0539 02/24/18 0451  NA 140 139  --  144 140  K 4.6 4.1  --  4.4 4.2  CL 112* 114*  --  115* 113*  CO2 12* 13*  --  11* 14*  GLUCOSE 77 92  --  89 89  BUN 85* 92*  --  88* 91*  CREATININE 10.65* 10.76*  --  10.57* 10.60*  CALCIUM 8.9 8.9 8.9 8.8* 8.6*  AST 6* 7*  --   --   --   ALT 8 11  --   --   --   ALKPHOS  --  72  --   --   --   BILITOT 0.3 0.2*  --   --   --    ------------------------------------------------------------------------------------------------------------------ Recent Labs    02/21/18 1541  CHOL 181  HDL 40*  LDLCALC 116*  TRIG 136  CHOLHDL 4.5    Lab Results    Component Value Date   HGBA1C 5.7 (H) 02/24/2018   ------------------------------------------------------------------------------------------------------------------ No results for input(s): TSH, T4TOTAL, T3FREE, THYROIDAB in the last 72 hours.  Invalid input(s): FREET3 ------------------------------------------------------------------------------------------------------------------ Recent Labs    02/21/18 1541 02/22/18 1006 02/22/18 1007  VITAMINB12  --  659  --   FOLATE  --   --  15.3  FERRITIN 22 18  --   TIBC 233* 245*  --   IRON 21* 21*  --   RETICCTPCT  --  1.8  --     Coagulation profile Recent Labs  Lab 02/23/18 1232  INR 1.11    Roxan Hockey M.D on 02/24/2018 at 3:40 PM  Go to [email or web address] - for contact info  [facility] - Office  [phone]

## 2018-02-25 LAB — HEPATITIS B CORE ANTIBODY, IGM: Hep B C IgM: NEGATIVE

## 2018-02-25 LAB — HEPATITIS B SURFACE ANTIBODY,QUALITATIVE: Hep B S Ab: REACTIVE

## 2018-02-25 NOTE — Progress Notes (Addendum)
Patient Demographics:    Sara Meza, is a 43 y.o. female, DOB - [birth year], LNL:[id number]  Admit date - 02/22/2018   Admitting Physician Costin Karlyne Greenspan, MD  Outpatient Primary MD for the patient is [name], PA-C  LOS - 3   Chief Complaint  Patient presents with  . Abnormal Lab        Subjective:    [name] today has no fevers, no emesis,  No chest pain,   son at bedside, questions answered  Assessment  & Plan :    Principal Problem:   CKD (chronic kidney disease), stage V ([place]) Active Problems:   Essential hypertension   Iron deficiency   Retinopathy   Renal failure  Brief Summary 43 year old with past medical history relevant for hypertension hypertensive retinopathy admitted on 02/22/2018 with worsening renal function, and non-anion gap metabolic acidosis-likely will be requiring hemodialysis, Bil LE Dopplers negative for DVT, patient was started on HD on 02/24/2018,   Plan:- 1)AOCKD Vs Progressive CKD-----Nephrology input appreciated, was initiated on HD on 02/24/18,  repeat HD on  02/26/2018 ,  will need permanent HD access placement by vascular surgery, c/n  bicarb 1300 mg 3 times daily for non-anion gap metabolic acidosis, creatinine remains above 10, urine output adequate, renal ultrasound with bil atrophic kidneys, lab work-up for possible etiology of renal failure noted,hemoglobin A1c is 5.7, HIV negative, phosphorus is elevated at 9.4, protein electrophoresis is unremarkable, hepatitis B is negative, hepatitis C is pending, complement testing reveals no complement deficiency, ANA and ANCA titers are pending.... Per nephrologist kidney biopsy may not change management or outcome.... UA with glucosuria, hematuria and proteinuria  2)HTN--- history of hypertensive retinopathy, stable at this time, PTA was noncompliant with antihypertensives, patient denies history of  significant elevation of BP, was apparently told about 3 years ago that she has stage I hypertension with systolic blood pressure in the 140s and 150s only, nephrologist advises holding off on anti-hypertensives at this time  3)Chronic Anemia of CKD -----patient with iron deficiency anemia secondary to menorrhagia and also anem hemoglobin is up to 8.0  from 5.7 after transfusion of 2 units ,  - Aranesp and iron infusion by nephrologist  4)Secondary Hyperparathyroidism--- patient with PTH of 540 and phosphorous of 9.4, c/n Auryxia per nephrologist  5)Vitamin D deficiency--- vitamin D is low at 12, replace with 50,000 units weekly  Disposition/Need for in-Hospital Stay- patient unable to be discharged at this time due to lack of renal function recovery, pwas initiated on HD on 02/24/18, , repeat HD on  repeat HD on  02/26/2018 , will need permanent HD access placement by vascular surgery, patient will need to be clipped in order to get outpatient hemodialysis prior to discharge  Code Status : Full   Family Communication:   Husband at bedside  Disposition Plan  : home after she has been "clipped" for outpatient hemodialysis  Consults  :  Nephrology  DVT Prophylaxis  :   - SCDs   Lab Results  Component Value Date   PLT 285 02/24/2018    Inpatient Medications  Scheduled Meds: . calcitRIOL  0.25 mcg Oral Daily  . Chlorhexidine Gluconate Cloth  6 each Topical Q0600  . darbepoetin (ARANESP) injection - NON-DIALYSIS  60 mcg Subcutaneous Q Thu-1800  . ferric citrate  630 mg Oral TID WC  . Vitamin D (Ergocalciferol)  50,000 Units Oral Q7 days   Continuous Infusions: . ferric gluconate (FERRLECIT/NULECIT) IV 125 mg (02/25/18 0841)   PRN Meds:.    Anti-infectives (From admission, onward)   Start     Dose/Rate Route Frequency Ordered Stop   02/24/18 1534  ceFAZolin (ANCEF) powder       As needed 02/24/18 1534 02/24/18 1534   02/24/18 1518  ceFAZolin (ANCEF) 2-4 GM/100ML-% IVPB    Note to  Pharmacy:  [place]   : cabinet override      02/24/18 1518 02/25/18 0329   02/23/18 1300  ceFAZolin (ANCEF) IVPB 2g/100 mL premix     2 g 200 mL/hr over 30 Minutes Intravenous  Once 02/23/18 1157 02/24/18 0038        Objective:   Vitals:   02/25/18 0459 02/25/18 0508 02/25/18 0857 02/25/18 1237  BP: 134/85 (!) 125/43 120/70 (!) 142/79  Pulse: 77 79 79 81  Resp: 18  18 (!) 21  Temp: 98.8 F (37.1 C) 98.4 F (36.9 C) 99 F (37.2 C) 99.1 F (37.3 C)  TempSrc: Oral Oral Oral Oral  SpO2: 98% 97% 97% 97%  Weight: 70.8 kg     Height:        Wt Readings from Last 3 Encounters:  02/25/18 70.8 kg  02/22/18 73 kg  02/21/18 72.1 kg     Intake/Output Summary (Last 24 hours) at 02/25/2018 1657 Last data filed at 02/25/2018 1010 Gross per 24 hour  Intake 360 ml  Output 800 ml  Net -440 ml     Physical Exam Patient is examined daily including today on 02/25/18 , exams remain the same as of yesterday except that has changed   Gen:- Awake Alert,  no apparent distress HEENT:- [place].AT, No sclera icterus Neck-Supple Neck,No JVD,. Rt IJ HD catheter site is clean dry and intact Lungs-  CTAB , fair symmetrical air movement CV- S1, S2 normal, regular  Abd-  +ve B.Sounds, Abd Soft, No tenderness,    Extremity/Skin:- No  edema, pedal pulses present Psych-affect is appropriate, oriented x3 Neuro-no new focal deficits, no tremors   Data Review:   Micro Results Recent Results (from the past 240 hour(s))  Surgical pcr screen     Status: None   Collection Time: 02/23/18 10:09 PM  Result Value Ref Range Status   MRSA, PCR NEGATIVE NEGATIVE Final   Staphylococcus aureus NEGATIVE NEGATIVE Final    Comment: (NOTE) The Xpert SA Assay (FDA approved for NASAL specimens in patients 56 years of age and older), is one component of a comprehensive surveillance program. It is not intended to diagnose infection nor to guide or monitor treatment. Performed at [facility], [place]  [address], [place], [place] [address]     Radiology Reports US Renal  Result Date: 02/22/2018 CLINICAL DATA:  Acute renal failure EXAM: RENAL / URINARY TRACT ULTRASOUND COMPLETE COMPARISON:  None. FINDINGS: Right Kidney: Renal measurements: 7.35 x 3.06 x 2.94 cm = volume: 34.62 mL. There is a tiny amount of perinephric fluid adjacent to the superior pole. Diffuse increased cortical echogenicity. Left Kidney: Renal measurements: 8.68 x 3.32 x 3.26 cm = volume: 49.19 mL. Minimal fullness of the collecting system. No gross hydronephrosis. Bladder: Appears normal for degree of bladder distention. IMPRESSION: 1. A small echogenic kidneys consistent with medical renal disease. 2. A tiny amount of fluid adjacent to  the superior pole the right kidney is age indeterminate nonspecific. 3. Minimal fullness in the left collecting system of doubtful acute significance. Electronically Signed   By: Dorise Bullion III M.D   On: 02/22/2018 11:57   Ir Fluoro Guide Cv Line Right  Result Date: 02/24/2018 INDICATION: 43 year old female with a history of renal failure EXAM: TUNNELED CENTRAL VENOUS HEMODIALYSIS CATHETER PLACEMENT WITH ULTRASOUND AND FLUOROSCOPIC GUIDANCE MEDICATIONS: 2.0 g Ancef. The antibiotic was given in an appropriate time interval prior to skin puncture. ANESTHESIA/SEDATION: Moderate (conscious) sedation was employed during this procedure. A total of Versed 1.0 mg and Fentanyl 75 mcg was administered intravenously. Moderate Sedation Time: 17 minutes. The patient's level of consciousness and vital signs were monitored continuously by radiology nursing throughout the procedure under my direct supervision. FLUOROSCOPY TIME:  Fluoroscopy Time: 0 minutes 12 seconds (0.5 mGy). COMPLICATIONS: None PROCEDURE: Informed written consent was obtained from the patient after a discussion of the risks, benefits, and alternatives to treatment. Questions regarding the procedure were encouraged and answered. The right neck and  chest were prepped with chlorhexidine in a sterile fashion, and a sterile drape was applied covering the operative field. Maximum barrier sterile technique with sterile gowns and gloves were used for the procedure. A timeout was performed prior to the initiation of the procedure. After creating a small venotomy incision, a micropuncture kit was utilized to access the right internal jugular vein under direct, real-time ultrasound guidance after the overlying soft tissues were anesthetized with 1% lidocaine with epinephrine. Ultrasound image documentation was performed. The microwire was marked to measure appropriate internal catheter length. External tunneled length was estimated. A total tip to cuff length of 19 cm was selected. Skin and subcutaneous tissues of chest wall below the clavicle were generously infiltrated with 1% lidocaine for local anesthesia. A small stab incision was made with 11 blade scalpel. The selected hemodialysis catheter was tunneled in a retrograde fashion from the anterior chest wall to the venotomy incision. A guidewire was advanced to the level of the IVC and the micropuncture sheath was exchanged for a peel-away sheath. The catheter was then placed through the peel-away sheath with tips ultimately positioned within the superior aspect of the right atrium. Final catheter positioning was confirmed and documented with a spot radiographic image. The catheter aspirates and flushes normally. The catheter was flushed with appropriate volume heparin dwells. The catheter exit site was secured with a 0-Prolene retention suture. The venotomy incision was closed Derma bond and sterile dressing. Dressings were applied at the chest wall. Patient tolerated the procedure well and remained hemodynamically stable throughout. No complications were encountered and no significant blood loss encountered. IMPRESSION: Status post right IJ tunneled hemodialysis catheter placement. Catheter ready for use. Signed,  Dulcy Fanny. Dellia Nims, RPVI [facility] Radiology Electronically Signed   By: Corrie Mckusick D.O.   On: 02/24/2018 16:28   Ir US Guide Vasc Access Right  Result Date: 02/24/2018 INDICATION: 43 year old female with a history of renal failure EXAM: TUNNELED CENTRAL VENOUS HEMODIALYSIS CATHETER PLACEMENT WITH ULTRASOUND AND FLUOROSCOPIC GUIDANCE MEDICATIONS: 2.0 g Ancef. The antibiotic was given in an appropriate time interval prior to skin puncture. ANESTHESIA/SEDATION: Moderate (conscious) sedation was employed during this procedure. A total of Versed 1.0 mg and Fentanyl 75 mcg was administered intravenously. Moderate Sedation Time: 17 minutes. The patient's level of consciousness and vital signs were monitored continuously by radiology nursing throughout the procedure under my direct supervision. FLUOROSCOPY TIME:  Fluoroscopy Time: 0  minutes 12 seconds (0.5 mGy). COMPLICATIONS: None PROCEDURE: Informed written consent was obtained from the patient after a discussion of the risks, benefits, and alternatives to treatment. Questions regarding the procedure were encouraged and answered. The right neck and chest were prepped with chlorhexidine in a sterile fashion, and a sterile drape was applied covering the operative field. Maximum barrier sterile technique with sterile gowns and gloves were used for the procedure. A timeout was performed prior to the initiation of the procedure. After creating a small venotomy incision, a micropuncture kit was utilized to access the right internal jugular vein under direct, real-time ultrasound guidance after the overlying soft tissues were anesthetized with 1% lidocaine with epinephrine. Ultrasound image documentation was performed. The microwire was marked to measure appropriate internal catheter length. External tunneled length was estimated. A total tip to cuff length of 19 cm was selected. Skin and subcutaneous tissues of chest  wall below the clavicle were generously infiltrated with 1% lidocaine for local anesthesia. A small stab incision was made with 11 blade scalpel. The selected hemodialysis catheter was tunneled in a retrograde fashion from the anterior chest wall to the venotomy incision. A guidewire was advanced to the level of the IVC and the micropuncture sheath was exchanged for a peel-away sheath. The catheter was then placed through the peel-away sheath with tips ultimately positioned within the superior aspect of the right atrium. Final catheter positioning was confirmed and documented with a spot radiographic image. The catheter aspirates and flushes normally. The catheter was flushed with appropriate volume heparin dwells. The catheter exit site was secured with a 0-Prolene retention suture. The venotomy incision was closed Derma bond and sterile dressing. Dressings were applied at the chest wall. Patient tolerated the procedure well and remained hemodynamically stable throughout. No complications were encountered and no significant blood loss encountered. IMPRESSION: Status post right IJ tunneled hemodialysis catheter placement. Catheter ready for use. Signed, Dulcy Fanny. Dellia Nims, RPVI [facility] Radiology Electronically Signed   By: Corrie Mckusick D.O.   On: 02/24/2018 16:28   Vas [place] Lower Extremity Venous (dvt)  Result Date: 02/23/2018  Lower Venous Study Indications: Swelling.  Performing Technologist: Oliver Hum RVT  Examination Guidelines: A complete evaluation includes B-mode imaging, spectral Doppler, color Doppler, and power Doppler as needed of all accessible portions of each vessel. Bilateral testing is considered an integral part of a complete examination. Limited examinations for reoccurring indications may be performed as noted.  Right Venous Findings: +---------+---------------+---------+-----------+----------+-------+           CompressibilityPhasicitySpontaneityPropertiesSummary +---------+---------------+---------+-----------+----------+-------+ CFV      Full           Yes      Yes                          +---------+---------------+---------+-----------+----------+-------+ SFJ      Full                                                 +---------+---------------+---------+-----------+----------+-------+ FV Prox  Full                                                 +---------+---------------+---------+-----------+----------+-------+  FV Mid   Full                                                 +---------+---------------+---------+-----------+----------+-------+ FV DistalFull                                                 +---------+---------------+---------+-----------+----------+-------+ PFV      Full                                                 +---------+---------------+---------+-----------+----------+-------+ POP      Full           Yes      Yes                          +---------+---------------+---------+-----------+----------+-------+ PTV      Full                                                 +---------+---------------+---------+-----------+----------+-------+ PERO     Full                                                 +---------+---------------+---------+-----------+----------+-------+  Left Venous Findings: +---------+---------------+---------+-----------+----------+-------+          CompressibilityPhasicitySpontaneityPropertiesSummary +---------+---------------+---------+-----------+----------+-------+ CFV      Full           Yes      Yes                          +---------+---------------+---------+-----------+----------+-------+ SFJ      Full                                                 +---------+---------------+---------+-----------+----------+-------+ FV Prox  Full                                                  +---------+---------------+---------+-----------+----------+-------+ FV Mid   Full                                                 +---------+---------------+---------+-----------+----------+-------+ FV DistalFull                                                 +---------+---------------+---------+-----------+----------+-------+  PFV      Full                                                 +---------+---------------+---------+-----------+----------+-------+ POP      Full           Yes      Yes                          +---------+---------------+---------+-----------+----------+-------+ PTV      Full                                                 +---------+---------------+---------+-----------+----------+-------+ PERO     Full                                                 +---------+---------------+---------+-----------+----------+-------+    Summary: Right: There is no evidence of deep vein thrombosis in the lower extremity. No cystic structure found in the popliteal fossa. Left: There is no evidence of deep vein thrombosis in the lower extremity. No cystic structure found in the popliteal fossa.  *See table(s) above for measurements and observations. Electronically signed by Servando Snare MD on 02/23/2018 at 1:43:48 PM.    Final      CBC Recent Labs  Lab 02/21/18 1544 02/22/18 1006 02/22/18 2023 02/23/18 0539 02/24/18 0451  WBC 8.6 7.9  --  7.1 7.3  HGB 5.7* 5.7* 7.6* 7.9* 8.0*  HCT 17.3* 18.7* 24.3* 25.2* 25.6*  PLT 396 337  --  306 285  MCV 80.5 88.6  --  88.4 85.6  MCH 26.5* 27.0  --  27.7 26.8  MCHC 32.9 30.5  --  31.3 31.3  RDW 15.9* 16.0*  --  15.8* 15.2  LYMPHSABS 1,273 1.2  --   --   --   MONOABS  --  0.3  --   --   --   EOSABS 301 0.3  --   --   --   BASOSABS 26 0.0  --   --   --     Chemistries  Recent Labs  Lab 02/21/18 1541 02/22/18 1006 02/22/18 1423 02/23/18 0539 02/24/18 0451  NA 140 139  --  144 140  K 4.6 4.1  --  4.4 4.2    CL 112* 114*  --  115* 113*  CO2 12* 13*  --  11* 14*  GLUCOSE 77 92  --  89 89  BUN 85* 92*  --  88* 91*  CREATININE 10.65* 10.76*  --  10.57* 10.60*  CALCIUM 8.9 8.9 8.9 8.8* 8.6*  AST 6* 7*  --   --   --   ALT 8 11  --   --   --   ALKPHOS  --  72  --   --   --   BILITOT 0.3 0.2*  --   --   --    ------------------------------------------------------------------------------------------------------------------ No results for input(s): CHOL, HDL, LDLCALC, TRIG, CHOLHDL, LDLDIRECT in the last 72 hours.  Lab Results  Component Value Date  HGBA1C 5.7 (H) 02/24/2018   ------------------------------------------------------------------------------------------------------------------ No results for input(s): TSH, T4TOTAL, T3FREE, THYROIDAB in the last 72 hours.  Invalid input(s): FREET3 ------------------------------------------------------------------------------------------------------------------ No results for input(s): VITAMINB12, FOLATE, FERRITIN, TIBC, IRON, RETICCTPCT in the last 72 hours.  Coagulation profile Recent Labs  Lab 02/23/18 1232  INR 1.11    Roxan Hockey M.D on 02/25/2018 at 4:57 PM  Go to [email or web address] - for contact info  [facility] - Office  [phone]

## 2018-02-25 NOTE — Progress Notes (Signed)
Sara Meza  Assessment/ Plan: Pt is a 43 y.o. yo female with past medical history of hypertension for about 3 years, supposed to be on HCTZ but not taking, menorrhagia, recently diagnosed hypertensive retinopathy by her ophthalmologist, referred by PCP to the ER for the management of severe anemia and elevated serum creatinine level found on routine lab.   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  BP readings are however in acceptable range without any antihypertensive medication. Ultrasound kidneys with bilateral atrophic kidney and increased cortical echogenicity. She has hypertensive retinopathy. Urinalysis with no RBC, no WBC, PCR 3.2.  Patient is nonoliguric. -complements, HepBsAg, HIV negative. -Follow up protein electrophoresis, hepatitis C antibody,  ANA, ANCA,  -PTH elevated and atrophic kidneys I doubt there is any significant renal recovery.  Patient  has nausea, insomnia, dysgeusia and generalized weakness symptoms consistent of uremia.  I do not think biopsy will be helpful given atrophic kidneys.  Patient received first dialysis on 2/7 via right IJ TDC placed by IR, tolerated well.  Plan for second treatment today.  No ultrafiltration.  She will need VVS consult for permanent access placement, I ordered vein mapping.   -will need CLIP  #Anemia due to acute blood loss and CKD: Received 2 units of red blood cell transfusion.  Given history of menorrhagia I recommend GYN consult/pelvic ultrasound.   -Continue IV iron and Aranesp.  #Secondary hyperparathyroidism: PTH 540, high phosphorus.  Started Lorin Picket.  Calcitriol.  #History of hypertension: Monitor blood pressure.  Subjective: Seen and examined.  He had a first dialysis treatment yesterday, tolerated well.  Feels some weakness and nauseated.  No vomiting.  No shortness of breath or chest  pain. Objective Vital signs in last 24 hours: Vitals:   02/25/18 0034 02/25/18 0459 02/25/18 0508 02/25/18 0857  BP: 136/82 134/85 (!) 125/43 120/70  Pulse: 87 77 79 79  Resp: '18 18  18  ' Temp: 98.5 F (36.9 C) 98.8 F (37.1 C) 98.4 F (36.9 C) 99 F (37.2 C)  TempSrc: Oral Oral Oral Oral  SpO2: 98% 98% 97% 97%  Weight:  70.8 kg    Height:       Weight change: -0.161 kg  Intake/Output Summary (Last 24 hours) at 02/25/2018 1043 Last data filed at 02/25/2018 1010 Gross per 24 hour  Intake 360 ml  Output 800 ml  Net -440 ml       Labs: Basic Metabolic Panel: Recent Labs  Lab 02/22/18 1006 02/22/18 1423 02/23/18 0539 02/24/18 0451  NA 139  --  144 140  K 4.1  --  4.4 4.2  CL 114*  --  115* 113*  CO2 13*  --  11* 14*  GLUCOSE 92  --  89 89  BUN 92*  --  88* 91*  CREATININE 10.76*  --  10.57* 10.60*  CALCIUM 8.9 8.9 8.8* 8.6*  PHOS  --   --  9.4* 7.9*   Liver Function Tests: Recent Labs  Lab 02/21/18 1541 02/22/18 1006 02/24/18 0451  AST 6* 7*  --   ALT 8 11  --   ALKPHOS  --  72  --   BILITOT 0.3 0.2*  --   PROT 7.5 8.2*  --   ALBUMIN  --  3.8 3.1*   No results for input(s): LIPASE, AMYLASE in the last 168 hours. No results for input(s): AMMONIA in the last 168 hours.  CBC: Recent Labs  Lab 02/21/18 1544 02/22/18 1006 02/22/18 2023 02/23/18 0539 02/24/18 0451  WBC 8.6 7.9  --  7.1 7.3  NEUTROABS 6,631 6.2  --   --   --   HGB 5.7* 5.7* 7.6* 7.9* 8.0*  HCT 17.3* 18.7* 24.3* 25.2* 25.6*  MCV 80.5 88.6  --  88.4 85.6  PLT 396 337  --  306 285   Cardiac Enzymes: No results for input(s): CKTOTAL, CKMB, CKMBINDEX, TROPONINI in the last 168 hours. CBG: No results for input(s): GLUCAP in the last 168 hours.  Iron Studies:  No results for input(s): IRON, TIBC, TRANSFERRIN, FERRITIN in the last 72 hours. Studies/Results: Ir Fluoro Guide Cv Line Right  Result Date: 02/24/2018 INDICATION: 43 year old female with a history of renal failure EXAM: TUNNELED  CENTRAL VENOUS HEMODIALYSIS CATHETER PLACEMENT WITH ULTRASOUND AND FLUOROSCOPIC GUIDANCE MEDICATIONS: 2.0 g Ancef. The antibiotic was given in an appropriate time interval prior to skin puncture. ANESTHESIA/SEDATION: Moderate (conscious) sedation was employed during this procedure. A total of Versed 1.0 mg and Fentanyl 75 mcg was administered intravenously. Moderate Sedation Time: 17 minutes. The patient's level of consciousness and vital signs were monitored continuously by radiology nursing throughout the procedure under my direct supervision. FLUOROSCOPY TIME:  Fluoroscopy Time: 0 minutes 12 seconds (0.5 mGy). COMPLICATIONS: None PROCEDURE: Informed written consent was obtained from the patient after a discussion of the risks, benefits, and alternatives to treatment. Questions regarding the procedure were encouraged and answered. The right neck and chest were prepped with chlorhexidine in a sterile fashion, and a sterile drape was applied covering the operative field. Maximum barrier sterile technique with sterile gowns and gloves were used for the procedure. A timeout was performed prior to the initiation of the procedure. After creating a small venotomy incision, a micropuncture kit was utilized to access the right internal jugular vein under direct, real-time ultrasound guidance after the overlying soft tissues were anesthetized with 1% lidocaine with epinephrine. Ultrasound image documentation was performed. The microwire was marked to measure appropriate internal catheter length. External tunneled length was estimated. A total tip to cuff length of 19 cm was selected. Skin and subcutaneous tissues of chest wall below the clavicle were generously infiltrated with 1% lidocaine for local anesthesia. A small stab incision was made with 11 blade scalpel. The selected hemodialysis catheter was tunneled in a retrograde fashion from the anterior chest wall to the venotomy incision. A guidewire was advanced to the  level of the IVC and the micropuncture sheath was exchanged for a peel-away sheath. The catheter was then placed through the peel-away sheath with tips ultimately positioned within the superior aspect of the right atrium. Final catheter positioning was confirmed and documented with a spot radiographic image. The catheter aspirates and flushes normally. The catheter was flushed with appropriate volume heparin dwells. The catheter exit site was secured with a 0-Prolene retention suture. The venotomy incision was closed Derma bond and sterile dressing. Dressings were applied at the chest wall. Patient tolerated the procedure well and remained hemodynamically stable throughout. No complications were encountered and no significant blood loss encountered. IMPRESSION: Status post right IJ tunneled hemodialysis catheter placement. Catheter ready for use. Signed, Dulcy Fanny. Dellia Nims, RPVI [facility] Radiology Electronically Signed   By: Corrie Mckusick D.O.   On: 02/24/2018 16:28   Ir US Guide Vasc Access Right  Result Date: 02/24/2018 INDICATION: 43 year old female with a history of renal failure EXAM: TUNNELED [place]  WITH ULTRASOUND AND FLUOROSCOPIC GUIDANCE MEDICATIONS: 2.0 g Ancef. The antibiotic was given in an appropriate time interval prior to skin puncture. ANESTHESIA/SEDATION: Moderate (conscious) sedation was employed during this procedure. A total of Versed 1.0 mg and Fentanyl 75 mcg was administered intravenously. Moderate Sedation Time: 17 minutes. The patient's level of consciousness and vital signs were monitored continuously by radiology nursing throughout the procedure under my direct supervision. FLUOROSCOPY TIME:  Fluoroscopy Time: 0 minutes 12 seconds (0.5 mGy). COMPLICATIONS: None PROCEDURE: Informed written consent was obtained from the patient after a discussion of the risks, benefits, and alternatives to  treatment. Questions regarding the procedure were encouraged and answered. The right neck and chest were prepped with chlorhexidine in a sterile fashion, and a sterile drape was applied covering the operative field. Maximum barrier sterile technique with sterile gowns and gloves were used for the procedure. A timeout was performed prior to the initiation of the procedure. After creating a small venotomy incision, a micropuncture kit was utilized to access the right internal jugular vein under direct, real-time ultrasound guidance after the overlying soft tissues were anesthetized with 1% lidocaine with epinephrine. Ultrasound image documentation was performed. The microwire was marked to measure appropriate internal catheter length. External tunneled length was estimated. A total tip to cuff length of 19 cm was selected. Skin and subcutaneous tissues of chest wall below the clavicle were generously infiltrated with 1% lidocaine for local anesthesia. A small stab incision was made with 11 blade scalpel. The selected hemodialysis catheter was tunneled in a retrograde fashion from the anterior chest wall to the venotomy incision. A guidewire was advanced to the level of the IVC and the micropuncture sheath was exchanged for a peel-away sheath. The catheter was then placed through the peel-away sheath with tips ultimately positioned within the superior aspect of the right atrium. Final catheter positioning was confirmed and documented with a spot radiographic image. The catheter aspirates and flushes normally. The catheter was flushed with appropriate volume heparin dwells. The catheter exit site was secured with a 0-Prolene retention suture. The venotomy incision was closed Derma bond and sterile dressing. Dressings were applied at the chest wall. Patient tolerated the procedure well and remained hemodynamically stable throughout. No complications were encountered and no significant blood loss encountered. IMPRESSION:  Status post right IJ tunneled hemodialysis catheter placement. Catheter ready for use. Signed, Dulcy Fanny. Dellia Nims, RPVI [facility] Radiology Electronically Signed   By: Corrie Mckusick D.O.   On: 02/24/2018 16:28   Vas [place] Lower Extremity Venous (dvt)  Result Date: 02/23/2018  Lower Venous Study Indications: Swelling.  Performing Technologist: Oliver Hum RVT  Examination Guidelines: A complete evaluation includes B-mode imaging, spectral Doppler, color Doppler, and power Doppler as needed of all accessible portions of each vessel. Bilateral testing is considered an integral part of a complete examination. Limited examinations for reoccurring indications may be performed as noted.  Right Venous Findings: +---------+---------------+---------+-----------+----------+-------+          CompressibilityPhasicitySpontaneityPropertiesSummary +---------+---------------+---------+-----------+----------+-------+ CFV      Full           Yes      Yes                          +---------+---------------+---------+-----------+----------+-------+ SFJ      Full                                                 +---------+---------------+---------+-----------+----------+-------+  FV Prox  Full                                                 +---------+---------------+---------+-----------+----------+-------+ FV Mid   Full                                                 +---------+---------------+---------+-----------+----------+-------+ FV DistalFull                                                 +---------+---------------+---------+-----------+----------+-------+ PFV      Full                                                 +---------+---------------+---------+-----------+----------+-------+ POP      Full           Yes      Yes                          +---------+---------------+---------+-----------+----------+-------+ PTV       Full                                                 +---------+---------------+---------+-----------+----------+-------+ PERO     Full                                                 +---------+---------------+---------+-----------+----------+-------+  Left Venous Findings: +---------+---------------+---------+-----------+----------+-------+          CompressibilityPhasicitySpontaneityPropertiesSummary +---------+---------------+---------+-----------+----------+-------+ CFV      Full           Yes      Yes                          +---------+---------------+---------+-----------+----------+-------+ SFJ      Full                                                 +---------+---------------+---------+-----------+----------+-------+ FV Prox  Full                                                 +---------+---------------+---------+-----------+----------+-------+ FV Mid   Full                                                 +---------+---------------+---------+-----------+----------+-------+  FV DistalFull                                                 +---------+---------------+---------+-----------+----------+-------+ PFV      Full                                                 +---------+---------------+---------+-----------+----------+-------+ POP      Full           Yes      Yes                          +---------+---------------+---------+-----------+----------+-------+ PTV      Full                                                 +---------+---------------+---------+-----------+----------+-------+ PERO     Full                                                 +---------+---------------+---------+-----------+----------+-------+    Summary: Right: There is no evidence of deep vein thrombosis in the lower extremity. No cystic structure found in the popliteal fossa. Left: There is no evidence of deep vein thrombosis in the lower  extremity. No cystic structure found in the popliteal fossa.  *See table(s) above for measurements and observations. Electronically signed by Servando Snare MD on 02/23/2018 at 1:43:48 PM.    Final     Medications: Infusions: . ferric gluconate (FERRLECIT/NULECIT) IV 125 mg (02/25/18 0841)    Scheduled Medications: . calcitRIOL  0.25 mcg Oral Daily  . Chlorhexidine Gluconate Cloth  6 each Topical Q0600  . darbepoetin (ARANESP) injection - NON-DIALYSIS  60 mcg Subcutaneous Q Thu-1800  . ferric citrate  630 mg Oral TID WC  . Vitamin D (Ergocalciferol)  50,000 Units Oral Q7 days    have reviewed scheduled and prn medications.  Physical Exam: General: Not in distress, comfortable Heart:RRR, s1s2 nl no rubs Lungs: Clear bilateral, no wheezing or crackle Abdomen:soft, Non-tender, non-distended Extremities: No edema Neurology: Alert, awake, no asterixis.  Dron Prasad Bhandari 02/25/2018,10:43 AM  LOS: 3 days

## 2018-02-26 ENCOUNTER — Encounter (HOSPITAL_COMMUNITY): Payer: Self-pay

## 2018-02-26 LAB — CBC
HCT: 28.1 % — ABNORMAL LOW (ref 36.0–46.0)
Hemoglobin: 8.9 g/dL — ABNORMAL LOW (ref 12.0–15.0)
MCH: 26.9 pg (ref 26.0–34.0)
MCHC: 31.7 g/dL (ref 30.0–36.0)
MCV: 84.9 fL (ref 80.0–100.0)
Platelets: 266 10*3/uL (ref 150–400)
RBC: 3.31 MIL/uL — ABNORMAL LOW (ref 3.87–5.11)
RDW: 15.2 % (ref 11.5–15.5)
WBC: 8.2 10*3/uL (ref 4.0–10.5)
nRBC: 0 % (ref 0.0–0.2)

## 2018-02-26 LAB — RENAL FUNCTION PANEL
Albumin: 3.1 g/dL — ABNORMAL LOW (ref 3.5–5.0)
Anion gap: 16 — ABNORMAL HIGH (ref 5–15)
BUN: 66 mg/dL — AB (ref 6–20)
CO2: 17 mmol/L — ABNORMAL LOW (ref 22–32)
CREATININE: 8.92 mg/dL — AB (ref 0.44–1.00)
Calcium: 9.6 mg/dL (ref 8.9–10.3)
Chloride: 107 mmol/L (ref 98–111)
GFR calc Af Amer: 6 mL/min — ABNORMAL LOW (ref >60)
GFR calc non Af Amer: 5 mL/min — ABNORMAL LOW (ref >60)
Glucose, Bld: 92 mg/dL (ref 70–99)
Phosphorus: 6.8 mg/dL — ABNORMAL HIGH (ref 2.5–4.6)
Potassium: 3.9 mmol/L (ref 3.5–5.1)
Sodium: 140 mmol/L (ref 135–145)

## 2018-02-26 MED ORDER — HEPARIN SODIUM (PORCINE) 1000 UNIT/ML IJ SOLN
INTRAMUSCULAR | Status: AC
  Filled 2018-02-26: qty 4

## 2018-02-26 MED ORDER — PENTAFLUOROPROP-TETRAFLUOROETH EX AERO: 1.0000 "application " | INHALATION_SPRAY | CUTANEOUS | Status: DC | PRN

## 2018-02-26 MED ORDER — CHLORHEXIDINE GLUCONATE CLOTH 2 % EX PADS: 6.0000 | MEDICATED_PAD | Freq: Every day | CUTANEOUS | Status: DC

## 2018-02-26 MED ORDER — SODIUM CHLORIDE 0.9 % IV SOLN: 100.0000 mL | INTRAVENOUS | Status: DC | PRN

## 2018-02-26 MED ORDER — ALTEPLASE 2 MG IJ SOLR: 2.0000 mg | Freq: Once | INTRAMUSCULAR | Status: DC | PRN

## 2018-02-26 MED ORDER — LIDOCAINE HCL (PF) 1 % IJ SOLN: 5.0000 mL | INTRAMUSCULAR | Status: DC | PRN

## 2018-02-26 MED ORDER — HEPARIN SODIUM (PORCINE) 1000 UNIT/ML IJ SOLN
3.2000 mL | Freq: Once | INTRAMUSCULAR | Status: AC
  Administered 2018-02-26: 3200 [IU]

## 2018-02-26 MED ORDER — HEPARIN SODIUM (PORCINE) 1000 UNIT/ML DIALYSIS: 1000.0000 [IU] | INTRAMUSCULAR | Status: DC | PRN

## 2018-02-26 MED ORDER — LIDOCAINE-PRILOCAINE 2.5-2.5 % EX CREA: 1.0000 "application " | TOPICAL_CREAM | CUTANEOUS | Status: DC | PRN

## 2018-02-26 NOTE — Procedures (Signed)
Patient was seen on dialysis and the procedure was supervised.  BFR 250  Via TDC BP is  130/92.   Patient appears to be tolerating treatment well  Sara Meza Tanna Furry 02/26/2018

## 2018-02-26 NOTE — Progress Notes (Addendum)
[facility] NEPHROLOGY PROGRESS NOTE  Assessment/ Plan: Pt is a 43 y.o. yo female with past medical history of hypertension for about 3 years, supposed to be on HCTZ but not taking, menorrhagia, recently diagnosed hypertensive retinopathy by her ophthalmologist, referred by PCP to the ER for the management of severe anemia and elevated serum creatinine level found on routine lab.   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  BP readings are however in acceptable range without any antihypertensive medication. Ultrasound kidneys with bilateral atrophic kidney and increased cortical echogenicity. She has hypertensive retinopathy. Urinalysis with no RBC, no WBC, PCR 3.2.  Patient is nonoliguric. -complements, HepBsAg, Hep C Ab,  HIV negative. -Follow up protein electrophoresis,  ANA, ANCA,  -PTH elevated, has anemia and atrophic kidneys, I doubt there is any significant renal recovery.  Patient  has nausea, insomnia, dysgeusia and generalized weakness symptoms suggestive of uremia.  I do not think kidney biopsy will be helpful given atrophic kidneys.  Patient received first dialysis on 2/7 via right IJ TDC placed by IR and second rx today. She is euvolumic and non-oliguric therefor no UF during HD.  - Plan for 3rd treatment tomorrow.  Vein mapping was ordered, discussed with vascular Dr. Carlis Abbott.  Need to consult vascular when vein mapping is done. -will need CLIP  #Anemia due to acute blood loss and CKD: Received 2 units of red blood cell transfusion on admission.  Given history of menorrhagia I recommend GYN consult/pelvic ultrasound.   -Continue IV iron and Aranesp.  #Secondary hyperparathyroidism: PTH 540, high phosphorus.  Started Lorin Picket.  Calcitriol.  #History of hypertension: Monitor blood pressure.  Subjective: Seen and examined.  Receiving second treatment today.  Feels better.   Denied nausea vomiting, chest pain or shortness of breath.  Objective Vital signs in last 24 hours: Vitals:   02/26/18 0835 02/26/18 0900 02/26/18 0930 02/26/18 1000  BP: 133/85 137/89 (!) 130/92 122/85  Pulse: 71 74 72 78  Resp: '17 18 17 16  ' Temp:      TempSrc:      SpO2:      Weight:      Height:       Weight change: -0.156 kg No intake or output data in the 24 hours ending 02/26/18 1109     Labs: Basic Metabolic Panel: Recent Labs  Lab 02/23/18 0539 02/24/18 0451 02/26/18 0450  NA 144 140 140  K 4.4 4.2 3.9  CL 115* 113* 107  CO2 11* 14* 17*  GLUCOSE 89 89 92  BUN 88* 91* 66*  CREATININE 10.57* 10.60* 8.92*  CALCIUM 8.8* 8.6* 9.6  PHOS 9.4* 7.9* 6.8*   Liver Function Tests: Recent Labs  Lab 02/21/18 1541 02/22/18 1006 02/24/18 0451 02/26/18 0450  AST 6* 7*  --   --   ALT 8 11  --   --   ALKPHOS  --  72  --   --   BILITOT 0.3 0.2*  --   --   PROT 7.5 8.2*  --   --   ALBUMIN  --  3.8 3.1* 3.1*   No results for input(s): LIPASE, AMYLASE in the last 168 hours. No results for input(s): AMMONIA in the last 168 hours. CBC: Recent Labs  Lab 02/21/18 1544 02/22/18 1006  02/23/18 0539 02/24/18 0451 02/26/18 0450  WBC 8.6 7.9  --  7.1 7.3 8.2  NEUTROABS 6,631 6.2  --   --   --   --  HGB 5.7* 5.7*   < > 7.9* 8.0* 8.9*  HCT 17.3* 18.7*   < > 25.2* 25.6* 28.1*  MCV 80.5 88.6  --  88.4 85.6 84.9  PLT 396 337  --  306 285 266   < > = values in this interval not displayed.   Cardiac Enzymes: No results for input(s): CKTOTAL, CKMB, CKMBINDEX, TROPONINI in the last 168 hours. CBG: No results for input(s): GLUCAP in the last 168 hours.  Iron Studies:  No results for input(s): IRON, TIBC, TRANSFERRIN, FERRITIN in the last 72 hours. Studies/Results: Ir Fluoro Guide Cv Line Right  Result Date: 02/24/2018 INDICATION: 43 year old female with a history of renal failure EXAM: TUNNELED CENTRAL VENOUS HEMODIALYSIS CATHETER PLACEMENT WITH ULTRASOUND AND  FLUOROSCOPIC GUIDANCE MEDICATIONS: 2.0 g Ancef. The antibiotic was given in an appropriate time interval prior to skin puncture. ANESTHESIA/SEDATION: Moderate (conscious) sedation was employed during this procedure. A total of Versed 1.0 mg and Fentanyl 75 mcg was administered intravenously. Moderate Sedation Time: 17 minutes. The patient's level of consciousness and vital signs were monitored continuously by radiology nursing throughout the procedure under my direct supervision. FLUOROSCOPY TIME:  Fluoroscopy Time: 0 minutes 12 seconds (0.5 mGy). COMPLICATIONS: None PROCEDURE: Informed written consent was obtained from the patient after a discussion of the risks, benefits, and alternatives to treatment. Questions regarding the procedure were encouraged and answered. The right neck and chest were prepped with chlorhexidine in a sterile fashion, and a sterile drape was applied covering the operative field. Maximum barrier sterile technique with sterile gowns and gloves were used for the procedure. A timeout was performed prior to the initiation of the procedure. After creating a small venotomy incision, a micropuncture kit was utilized to access the right internal jugular vein under direct, real-time ultrasound guidance after the overlying soft tissues were anesthetized with 1% lidocaine with epinephrine. Ultrasound image documentation was performed. The microwire was marked to measure appropriate internal catheter length. External tunneled length was estimated. A total tip to cuff length of 19 cm was selected. Skin and subcutaneous tissues of chest wall below the clavicle were generously infiltrated with 1% lidocaine for local anesthesia. A small stab incision was made with 11 blade scalpel. The selected hemodialysis catheter was tunneled in a retrograde fashion from the anterior chest wall to the venotomy incision. A guidewire was advanced to the level of the IVC and the micropuncture sheath was exchanged for a  peel-away sheath. The catheter was then placed through the peel-away sheath with tips ultimately positioned within the superior aspect of the right atrium. Final catheter positioning was confirmed and documented with a spot radiographic image. The catheter aspirates and flushes normally. The catheter was flushed with appropriate volume heparin dwells. The catheter exit site was secured with a 0-Prolene retention suture. The venotomy incision was closed Derma bond and sterile dressing. Dressings were applied at the chest wall. Patient tolerated the procedure well and remained hemodynamically stable throughout. No complications were encountered and no significant blood loss encountered. IMPRESSION: Status post right IJ tunneled hemodialysis catheter placement. Catheter ready for use. Signed, Dulcy Fanny. Dellia Nims, RPVI [facility] Radiology Electronically Signed   By: Corrie Mckusick D.O.   On: 02/24/2018 16:28   Ir US Guide Vasc Access Right  Result Date: 02/24/2018 INDICATION: 43 year old female with a history of renal failure EXAM: TUNNELED CENTRAL VENOUS HEMODIALYSIS CATHETER PLACEMENT WITH ULTRASOUND AND FLUOROSCOPIC GUIDANCE MEDICATIONS: 2.0 g Ancef. The antibiotic was given in an appropriate time  interval prior to skin puncture. ANESTHESIA/SEDATION: Moderate (conscious) sedation was employed during this procedure. A total of Versed 1.0 mg and Fentanyl 75 mcg was administered intravenously. Moderate Sedation Time: 17 minutes. The patient's level of consciousness and vital signs were monitored continuously by radiology nursing throughout the procedure under my direct supervision. FLUOROSCOPY TIME:  Fluoroscopy Time: 0 minutes 12 seconds (0.5 mGy). COMPLICATIONS: None PROCEDURE: Informed written consent was obtained from the patient after a discussion of the risks, benefits, and alternatives to treatment. Questions regarding the procedure were encouraged and  answered. The right neck and chest were prepped with chlorhexidine in a sterile fashion, and a sterile drape was applied covering the operative field. Maximum barrier sterile technique with sterile gowns and gloves were used for the procedure. A timeout was performed prior to the initiation of the procedure. After creating a small venotomy incision, a micropuncture kit was utilized to access the right internal jugular vein under direct, real-time ultrasound guidance after the overlying soft tissues were anesthetized with 1% lidocaine with epinephrine. Ultrasound image documentation was performed. The microwire was marked to measure appropriate internal catheter length. External tunneled length was estimated. A total tip to cuff length of 19 cm was selected. Skin and subcutaneous tissues of chest wall below the clavicle were generously infiltrated with 1% lidocaine for local anesthesia. A small stab incision was made with 11 blade scalpel. The selected hemodialysis catheter was tunneled in a retrograde fashion from the anterior chest wall to the venotomy incision. A guidewire was advanced to the level of the IVC and the micropuncture sheath was exchanged for a peel-away sheath. The catheter was then placed through the peel-away sheath with tips ultimately positioned within the superior aspect of the right atrium. Final catheter positioning was confirmed and documented with a spot radiographic image. The catheter aspirates and flushes normally. The catheter was flushed with appropriate volume heparin dwells. The catheter exit site was secured with a 0-Prolene retention suture. The venotomy incision was closed Derma bond and sterile dressing. Dressings were applied at the chest wall. Patient tolerated the procedure well and remained hemodynamically stable throughout. No complications were encountered and no significant blood loss encountered. IMPRESSION: Status post right IJ tunneled hemodialysis catheter placement.  Catheter ready for use. Signed, Dulcy Fanny. Dellia Nims, RPVI [facility] Radiology Electronically Signed   By: Corrie Mckusick D.O.   On: 02/24/2018 16:28    Medications: Infusions: . sodium chloride    . sodium chloride    . ferric gluconate (FERRLECIT/NULECIT) IV 125 mg (02/25/18 0841)    Scheduled Medications: . heparin      . calcitRIOL  0.25 mcg Oral Daily  . Chlorhexidine Gluconate Cloth  6 each Topical Q0600  . darbepoetin (ARANESP) injection - NON-DIALYSIS  60 mcg Subcutaneous Q Thu-1800  . ferric citrate  630 mg Oral TID WC  . heparin  3.2 mL Intracatheter Once  . Vitamin D (Ergocalciferol)  50,000 Units Oral Q7 days    have reviewed scheduled and prn medications.  Physical Exam: General: Not in distress, comfortable Heart:RRR, s1s2 nl, no rubs Lungs: Clear bilateral, no wheezing or crackle Abdomen:soft, Non-tender, non-distended Extremities: No edema Neurology: Alert, awake, no asterixis. Right IJ tunnel catheter looks clean.  Filippo Puls Prasad Toiya Morrish 02/26/2018,11:09 AM  LOS: 4 days

## 2018-02-26 NOTE — Progress Notes (Signed)
                                           Patient Demographics:    Sara Meza, is a 42 y.o. female, DOB - 03/05/1975, MRN:[id number]  Admit date - 02/22/2018   Admitting Physician Costin M Gherghe, MD  Outpatient Primary MD for the patient is Cummings, Charley Elizabeth, PA-C  LOS - 4   Chief Complaint  Patient presents with  . Abnormal Lab        Subjective:    Sara Meza today has no fevers, no emesis,  No chest pain,   patient was seen on 6 floor HD unit, no new complaints voiding okay  Assessment  & Plan :    Principal Problem:   CKD (chronic kidney disease), stage V (HCC) Active Problems:   Essential hypertension   Iron deficiency   Retinopathy   Renal failure  Brief Summary 42-year-old with past medical history relevant for hypertension hypertensive retinopathy admitted on 02/22/2018 with worsening renal function, and non-anion gap metabolic acidosis-likely will be requiring hemodialysis, Bil LE Dopplers negative for DVT, patient was started on HD on 02/24/2018,   Plan:- 1)AOCKD Vs Progressive CKD-----Nephrology input appreciated, was initiated on HD on 02/24/18, , repeat HD on  02/26/2018 , will need permanent HD access placement by vascular surgery, c/n  bicarb 1300 mg 3 times daily for non-anion gap metabolic acidosis, creatinine remains above 10, urine output adequate, renal ultrasound with bil atrophic kidneys, lab work-up for possible etiology of renal failure noted,hemoglobin A1c is 5.7, HIV negative, phosphorus is elevated at 9.4, protein electrophoresis is unremarkable, hepatitis B is negative, hepatitis C is pending, complement testing reveals no complement deficiency, ANA and ANCA titers are pending.... Per nephrologist kidney biopsy may not change management or outcome.... UA with glucosuria, hematuria and proteinuria  2)HTN--- history of hypertensive retinopathy, stable at this time, PTA was noncompliant with  antihypertensives, patient denies history of significant elevation of BP, was apparently told about 3 years ago that she has stage I hypertension with systolic blood pressure in the 140s and 150s only, nephrologist advises holding off on anti-hypertensives at this time  3)Chronic Anemia of CKD -----patient with iron deficiency anemia secondary to menorrhagia and also anem hemoglobin is up to 8.0  from 5.7 after transfusion of 2 units ,  - Aranesp and iron infusion by nephrologist  4)Secondary Hyperparathyroidism--- patient with PTH of 540 and phosphorous of 9.4, c/n Auryxia per nephrologist  5)Vitamin D deficiency--- vitamin D is low at 12, replace with 50,000 units weekly  Disposition/Need for in-Hospital Stay- patient unable to be discharged at this time due to lack of renal function recovery, pwas initiated on HD on 02/24/18, ,  will need permanent HD access placement by vascular surgery, patient will need to be clipped in order to get outpatient hemodialysis prior to discharge  Code Status : Full   Family Communication:   Husband at bedside  Disposition Plan  : home after she has been "clipped" for outpatient hemodialysis  Consults  :  Nephrology  DVT Prophylaxis  :   - SCDs   Lab Results  Component Value Date   PLT 266 02/26/2018    Inpatient Medications  Scheduled Meds: . calcitRIOL  0.25 mcg Oral Daily  . Chlorhexidine Gluconate Cloth  6 each Topical Q0600  . darbepoetin (ARANESP) injection - NON-DIALYSIS  60   mcg Subcutaneous Q Thu-1800  . ferric citrate  630 mg Oral TID WC  . Vitamin D (Ergocalciferol)  50,000 Units Oral Q7 days   Continuous Infusions: . ferric gluconate (FERRLECIT/NULECIT) IV 125 mg (02/25/18 0841)   PRN Meds:.    Anti-infectives (From admission, onward)   Start     Dose/Rate Route Frequency Ordered Stop   02/24/18 1534  ceFAZolin (ANCEF) powder       As needed 02/24/18 1534 02/24/18 1534   02/24/18 1518  ceFAZolin (ANCEF) 2-4 GM/100ML-% IVPB      Note to Pharmacy:  Collins, Karen   : cabinet override      02/24/18 1518 02/25/18 0329   02/23/18 1300  ceFAZolin (ANCEF) IVPB 2g/100 mL premix     2 g 200 mL/hr over 30 Minutes Intravenous  Once 02/23/18 1157 02/24/18 0038        Objective:   Vitals:   02/26/18 0930 02/26/18 1000 02/26/18 1030 02/26/18 1100  BP: (!) 130/92 122/85 124/82 128/76  Pulse: 72 78 81 79  Resp: 17 16 16 17  Temp:    98 F (36.7 C)  TempSrc:    Oral  SpO2:    100%  Weight:    70.5 kg  Height:        Wt Readings from Last 3 Encounters:  02/26/18 70.5 kg  02/22/18 73 kg  02/21/18 72.1 kg     Intake/Output Summary (Last 24 hours) at 02/26/2018 1722 Last data filed at 02/26/2018 1100 Gross per 24 hour  Intake -  Output 0 ml  Net 0 ml     Physical Exam Patient is examined daily including today on 02/26/18 , exams remain the same as of yesterday except that has changed   Gen:- Awake Alert,  no apparent distress HEENT:- [place].AT, No sclera icterus Neck-Supple Neck,No JVD,. Rt IJ HD catheter site is clean dry and intact Lungs-  CTAB , fair symmetrical air movement CV- S1, S2 normal, regular  Abd-  +ve B.Sounds, Abd Soft, No tenderness,    Extremity/Skin:- No  edema, pedal pulses present Psych-affect is appropriate, oriented x3 Neuro-no new focal deficits, no tremors   Data Review:   Micro Results Recent Results (from the past 240 hour(s))  Surgical pcr screen     Status: None   Collection Time: 02/23/18 10:09 PM  Result Value Ref Range Status   MRSA, PCR NEGATIVE NEGATIVE Final   Staphylococcus aureus NEGATIVE NEGATIVE Final    Comment: (NOTE) The Xpert SA Assay (FDA approved for NASAL specimens in patients 22 years of age and older), is one component of a comprehensive surveillance program. It is not intended to diagnose infection nor to guide or monitor treatment. Performed at [facility], [address] [place],  [address]     Radiology Reports Us Renal  Result  Date: 02/22/2018 CLINICAL DATA:  Acute renal failure EXAM: RENAL / URINARY TRACT ULTRASOUND COMPLETE COMPARISON:  None. FINDINGS: Right Kidney: Renal measurements: 7.35 x 3.06 x 2.94 cm = volume: 34.62 mL. There is a tiny amount of perinephric fluid adjacent to the superior pole. Diffuse increased cortical echogenicity. Left Kidney: Renal measurements: 8.68 x 3.32 x 3.26 cm = volume: 49.19 mL. Minimal fullness of the collecting system. No gross hydronephrosis. Bladder: Appears normal for degree of bladder distention. IMPRESSION: 1. A small echogenic kidneys consistent with medical renal disease. 2. A tiny amount of fluid adjacent to the superior pole the right kidney is age indeterminate nonspecific. 3. Minimal   fullness in the left collecting system of doubtful acute significance. Electronically Signed   By: David  Williams III M.D   On: 02/22/2018 11:57   Ir Fluoro Guide Cv Line Right  Result Date: 02/24/2018 INDICATION: 42-year-old female with a history of renal failure EXAM: TUNNELED CENTRAL VENOUS HEMODIALYSIS CATHETER PLACEMENT WITH ULTRASOUND AND FLUOROSCOPIC GUIDANCE MEDICATIONS: 2.0 g Ancef. The antibiotic was given in an appropriate time interval prior to skin puncture. ANESTHESIA/SEDATION: Moderate (conscious) sedation was employed during this procedure. A total of Versed 1.0 mg and Fentanyl 75 mcg was administered intravenously. Moderate Sedation Time: 17 minutes. The patient's level of consciousness and vital signs were monitored continuously by radiology nursing throughout the procedure under my direct supervision. FLUOROSCOPY TIME:  Fluoroscopy Time: 0 minutes 12 seconds (0.5 mGy). COMPLICATIONS: None PROCEDURE: Informed written consent was obtained from the patient after a discussion of the risks, benefits, and alternatives to treatment. Questions regarding the procedure were encouraged and answered. The right neck and chest were prepped with chlorhexidine in a sterile fashion, and a sterile drape  was applied covering the operative field. Maximum barrier sterile technique with sterile gowns and gloves were used for the procedure. A timeout was performed prior to the initiation of the procedure. After creating a small venotomy incision, a micropuncture kit was utilized to access the right internal jugular vein under direct, real-time ultrasound guidance after the overlying soft tissues were anesthetized with 1% lidocaine with epinephrine. Ultrasound image documentation was performed. The microwire was marked to measure appropriate internal catheter length. External tunneled length was estimated. A total tip to cuff length of 19 cm was selected. Skin and subcutaneous tissues of chest wall below the clavicle were generously infiltrated with 1% lidocaine for local anesthesia. A small stab incision was made with 11 blade scalpel. The selected hemodialysis catheter was tunneled in a retrograde fashion from the anterior chest wall to the venotomy incision. A guidewire was advanced to the level of the IVC and the micropuncture sheath was exchanged for a peel-away sheath. The catheter was then placed through the peel-away sheath with tips ultimately positioned within the superior aspect of the right atrium. Final catheter positioning was confirmed and documented with a spot radiographic image. The catheter aspirates and flushes normally. The catheter was flushed with appropriate volume heparin dwells. The catheter exit site was secured with a 0-Prolene retention suture. The venotomy incision was closed Derma bond and sterile dressing. Dressings were applied at the chest wall. Patient tolerated the procedure well and remained hemodynamically stable throughout. No complications were encountered and no significant blood loss encountered. IMPRESSION: Status post right IJ tunneled hemodialysis catheter placement. Catheter ready for use. Signed, Jaime S. Wagner, DO, RPVI [facility]  [facility] Electronically Signed   By: Jaime  Wagner D.O.   On: 02/24/2018 16:28   Ir Us Guide Vasc Access Right  Result Date: 02/24/2018 INDICATION: 42-year-old female with a history of renal failure EXAM: TUNNELED CENTRAL VENOUS HEMODIALYSIS CATHETER PLACEMENT WITH ULTRASOUND AND FLUOROSCOPIC GUIDANCE MEDICATIONS: 2.0 g Ancef. The antibiotic was given in an appropriate time interval prior to skin puncture. ANESTHESIA/SEDATION: Moderate (conscious) sedation was employed during this procedure. A total of Versed 1.0 mg and Fentanyl 75 mcg was administered intravenously. Moderate Sedation Time: 17 minutes. The patient's level of consciousness and vital signs were monitored continuously by radiology nursing throughout the procedure under my direct supervision. FLUOROSCOPY TIME:  Fluoroscopy Time: 0 minutes 12 seconds (0.5 mGy). COMPLICATIONS: None PROCEDURE: Informed written consent was   obtained from the patient after a discussion of the risks, benefits, and alternatives to treatment. Questions regarding the procedure were encouraged and answered. The right neck and chest were prepped with chlorhexidine in a sterile fashion, and a sterile drape was applied covering the operative field. Maximum barrier sterile technique with sterile gowns and gloves were used for the procedure. A timeout was performed prior to the initiation of the procedure. After creating a small venotomy incision, a micropuncture kit was utilized to access the right internal jugular vein under direct, real-time ultrasound guidance after the overlying soft tissues were anesthetized with 1% lidocaine with epinephrine. Ultrasound image documentation was performed. The microwire was marked to measure appropriate internal catheter length. External tunneled length was estimated. A total tip to cuff length of 19 cm was selected. Skin and subcutaneous tissues of chest wall below the clavicle were generously infiltrated with 1% lidocaine for  local anesthesia. A small stab incision was made with 11 blade scalpel. The selected hemodialysis catheter was tunneled in a retrograde fashion from the anterior chest wall to the venotomy incision. A guidewire was advanced to the level of the IVC and the micropuncture sheath was exchanged for a peel-away sheath. The catheter was then placed through the peel-away sheath with tips ultimately positioned within the superior aspect of the right atrium. Final catheter positioning was confirmed and documented with a spot radiographic image. The catheter aspirates and flushes normally. The catheter was flushed with appropriate volume heparin dwells. The catheter exit site was secured with a 0-Prolene retention suture. The venotomy incision was closed Derma bond and sterile dressing. Dressings were applied at the chest wall. Patient tolerated the procedure well and remained hemodynamically stable throughout. No complications were encountered and no significant blood loss encountered. IMPRESSION: Status post right IJ tunneled hemodialysis catheter placement. Catheter ready for use. Signed, Jaime S. Wagner, DO, RPVI [facility] Electronically Signed   By: Jaime  Wagner D.O.   On: 02/24/2018 16:28   Vas Us Lower Extremity Venous (dvt)  Result Date: 02/23/2018  Lower Venous Study Indications: Swelling.  Performing Technologist: Gregory Collins RVT  Examination Guidelines: A complete evaluation includes B-mode imaging, spectral Doppler, color Doppler, and power Doppler as needed of all accessible portions of each vessel. Bilateral testing is considered an integral part of a complete examination. Limited examinations for reoccurring indications may be performed as noted.  Right Venous Findings: +---------+---------------+---------+-----------+----------+-------+          CompressibilityPhasicitySpontaneityPropertiesSummary  +---------+---------------+---------+-----------+----------+-------+ CFV      Full           Yes      Yes                          +---------+---------------+---------+-----------+----------+-------+ SFJ      Full                                                 +---------+---------------+---------+-----------+----------+-------+ FV Prox  Full                                                 +---------+---------------+---------+-----------+----------+-------+ FV Mid   Full                                                 +---------+---------------+---------+-----------+----------+-------+   FV DistalFull                                                 +---------+---------------+---------+-----------+----------+-------+ PFV      Full                                                 +---------+---------------+---------+-----------+----------+-------+ POP      Full           Yes      Yes                          +---------+---------------+---------+-----------+----------+-------+ PTV      Full                                                 +---------+---------------+---------+-----------+----------+-------+ PERO     Full                                                 +---------+---------------+---------+-----------+----------+-------+  Left Venous Findings: +---------+---------------+---------+-----------+----------+-------+          CompressibilityPhasicitySpontaneityPropertiesSummary +---------+---------------+---------+-----------+----------+-------+ CFV      Full           Yes      Yes                          +---------+---------------+---------+-----------+----------+-------+ SFJ      Full                                                 +---------+---------------+---------+-----------+----------+-------+ FV Prox  Full                                                  +---------+---------------+---------+-----------+----------+-------+ FV Mid   Full                                                 +---------+---------------+---------+-----------+----------+-------+ FV DistalFull                                                 +---------+---------------+---------+-----------+----------+-------+ PFV      Full                                                 +---------+---------------+---------+-----------+----------+-------+   POP      Full           Yes      Yes                          +---------+---------------+---------+-----------+----------+-------+ PTV      Full                                                 +---------+---------------+---------+-----------+----------+-------+ PERO     Full                                                 +---------+---------------+---------+-----------+----------+-------+    Summary: Right: There is no evidence of deep vein thrombosis in the lower extremity. No cystic structure found in the popliteal fossa. Left: There is no evidence of deep vein thrombosis in the lower extremity. No cystic structure found in the popliteal fossa.  *See table(s) above for measurements and observations. Electronically signed by Brandon Cain MD on 02/23/2018 at 1:43:48 PM.    Final      CBC Recent Labs  Lab 02/21/18 1544 02/22/18 1006 02/22/18 2023 02/23/18 0539 02/24/18 0451 02/26/18 0450  WBC 8.6 7.9  --  7.1 7.3 8.2  HGB 5.7* 5.7* 7.6* 7.9* 8.0* 8.9*  HCT 17.3* 18.7* 24.3* 25.2* 25.6* 28.1*  PLT 396 337  --  306 285 266  MCV 80.5 88.6  --  88.4 85.6 84.9  MCH 26.5* 27.0  --  27.7 26.8 26.9  MCHC 32.9 30.5  --  31.3 31.3 31.7  RDW 15.9* 16.0*  --  15.8* 15.2 15.2  LYMPHSABS 1,273 1.2  --   --   --   --   MONOABS  --  0.3  --   --   --   --   EOSABS 301 0.3  --   --   --   --   BASOSABS 26 0.0  --   --   --   --     Chemistries  Recent Labs  Lab 02/21/18 1541 02/22/18 1006 02/22/18 1423  02/23/18 0539 02/24/18 0451 02/26/18 0450  NA 140 139  --  144 140 140  K 4.6 4.1  --  4.4 4.2 3.9  CL 112* 114*  --  115* 113* 107  CO2 12* 13*  --  11* 14* 17*  GLUCOSE 77 92  --  89 89 92  BUN 85* 92*  --  88* 91* 66*  CREATININE 10.65* 10.76*  --  10.57* 10.60* 8.92*  CALCIUM 8.9 8.9 8.9 8.8* 8.6* 9.6  AST 6* 7*  --   --   --   --   ALT 8 11  --   --   --   --   ALKPHOS  --  72  --   --   --   --   BILITOT 0.3 0.2*  --   --   --   --    ------------------------------------------------------------------------------------------------------------------ No results for input(s): CHOL, HDL, LDLCALC, TRIG, CHOLHDL, LDLDIRECT in the last 72 hours.  Lab Results  Component Value Date   HGBA1C 5.7 (H) 02/24/2018   Coagulation profile Recent Labs  Lab 02/23/18   1232  INR 1.11    Roxan Hockey M.D on 02/26/2018 at 5:22 PM  Go to [email or web address] - for contact info  [facility] - Office  [phone]

## 2018-02-26 NOTE — Progress Notes (Signed)
Assumed care form previous Rn.

## 2018-02-27 ENCOUNTER — Inpatient Hospital Stay (HOSPITAL_COMMUNITY): Payer: Medicaid Other

## 2018-02-27 DIAGNOSIS — Z01810 Encounter for preprocedural cardiovascular examination: Secondary | ICD-10-CM

## 2018-02-27 LAB — CBC
HCT: 25.6 % — ABNORMAL LOW (ref 36.0–46.0)
Hemoglobin: 8.2 g/dL — ABNORMAL LOW (ref 12.0–15.0)
MCH: 27.7 pg (ref 26.0–34.0)
MCHC: 32 g/dL (ref 30.0–36.0)
MCV: 86.5 fL (ref 80.0–100.0)
Platelets: 234 10*3/uL (ref 150–400)
RBC: 2.96 MIL/uL — ABNORMAL LOW (ref 3.87–5.11)
RDW: 14.9 % (ref 11.5–15.5)
WBC: 10.3 10*3/uL (ref 4.0–10.5)
nRBC: 0 % (ref 0.0–0.2)

## 2018-02-27 LAB — RENAL FUNCTION PANEL
Albumin: 3 g/dL — ABNORMAL LOW (ref 3.5–5.0)
Anion gap: 12 (ref 5–15)
BUN: 53 mg/dL — ABNORMAL HIGH (ref 6–20)
CO2: 22 mmol/L (ref 22–32)
Calcium: 9.1 mg/dL (ref 8.9–10.3)
Chloride: 102 mmol/L (ref 98–111)
Creatinine, Ser: 7.73 mg/dL — ABNORMAL HIGH (ref 0.44–1.00)
GFR, EST AFRICAN AMERICAN: 7 mL/min — AB (ref >60)
GFR, EST NON AFRICAN AMERICAN: 6 mL/min — AB (ref >60)
Glucose, Bld: 124 mg/dL — ABNORMAL HIGH (ref 70–99)
PHOSPHORUS: 4.8 mg/dL — AB (ref 2.5–4.6)
Potassium: 3.4 mmol/L — ABNORMAL LOW (ref 3.5–5.1)
Sodium: 136 mmol/L (ref 135–145)

## 2018-02-27 MED ORDER — SODIUM CHLORIDE 0.9 % IV SOLN: 100.0000 mL | INTRAVENOUS | Status: DC | PRN

## 2018-02-27 MED ORDER — HEPARIN SODIUM (PORCINE) 1000 UNIT/ML DIALYSIS
2000.0000 [IU] | Freq: Once | INTRAMUSCULAR | Status: AC
  Administered 2018-02-27: 2000 [IU] via INTRAVENOUS_CENTRAL
  Filled 2018-02-27: qty 2

## 2018-02-27 MED ORDER — LIDOCAINE-PRILOCAINE 2.5-2.5 % EX CREA
1.0000 "application " | TOPICAL_CREAM | CUTANEOUS | Status: DC | PRN
  Filled 2018-02-27: qty 5

## 2018-02-27 MED ORDER — PENTAFLUOROPROP-TETRAFLUOROETH EX AERO: 1.0000 "application " | INHALATION_SPRAY | CUTANEOUS | Status: DC | PRN

## 2018-02-27 MED ORDER — LIDOCAINE HCL (PF) 1 % IJ SOLN: 5.0000 mL | INTRAMUSCULAR | Status: DC | PRN

## 2018-02-27 MED ORDER — HEPARIN SODIUM (PORCINE) 1000 UNIT/ML DIALYSIS
1000.0000 [IU] | INTRAMUSCULAR | Status: DC | PRN
  Administered 2018-02-28: 1000 [IU] via INTRAVENOUS_CENTRAL
  Administered 2018-03-01: 3200 [IU] via INTRAVENOUS_CENTRAL
  Filled 2018-02-27 (×3): qty 1

## 2018-02-27 MED ORDER — ALTEPLASE 2 MG IJ SOLR: 2.0000 mg | Freq: Once | INTRAMUSCULAR | Status: DC | PRN

## 2018-02-27 NOTE — Progress Notes (Signed)
Patient Demographics:    Sara Meza, is a 43 y.o. female, DOB - December 28, 1975, DTO:[id number]  Admit date - 02/22/2018   Admitting Physician Costin Karlyne Greenspan, MD  Outpatient Primary MD for the patient is [name], PA-C  LOS - 5   Chief Complaint  Patient presents with  . Abnormal Lab        Subjective:    [name] today has no fevers, no emesis,  No chest pain, resting comfortably,  Assessment  & Plan :    Principal Problem:   CKD (chronic kidney disease), stage V (HCC) Active Problems:   Essential hypertension   Iron deficiency   Retinopathy   Renal failure  Brief Summary 43 year old with past medical history relevant for hypertension hypertensive retinopathy admitted on 02/22/2018 with worsening renal function, and non-anion gap metabolic acidosis-likely will be requiring hemodialysis, Bil LE Dopplers negative for DVT, patient was started on HD on 02/24/2018, had bilateral upper extremity vein mapping on 02/27/2018, awaiting permanent hemodialysis access placement  Plan:- 1)AOCKD Vs Progressive CKD-----Nephrology input appreciated, was initiated on HD on 02/24/18, , repeat HD on  02/26/2018 , had bilateral upper extremity vein mapping on 02/27/2018, awaiting permanent hemodialysis access placement by vascular surgery, c/n  bicarb 1300 mg 3 times daily for non-anion gap metabolic acidosis, creatinine remains above 10, urine output adequate, renal ultrasound with bil atrophic kidneys, lab work-up for possible etiology of renal failure noted,hemoglobin A1c is 5.7, HIV negative, phosphorus is elevated at 9.4, protein electrophoresis is unremarkable, hepatitis B is negative, hepatitis C is pending, complement testing reveals no complement deficiency, ANA and ANCA titers are pending.... Per nephrologist kidney biopsy may not change management or outcome.... UA with glucosuria, hematuria and  proteinuria  2)HTN--- history of hypertensive retinopathy, stable at this time, PTA was noncompliant with antihypertensives, patient denies history of significant elevation of BP, was apparently told about 3 years ago that she has stage I hypertension with systolic blood pressure in the 140s and 150s only, nephrologist advises holding off on anti-hypertensives at this time  3)Chronic Anemia of CKD -----patient with iron deficiency anemia secondary to menorrhagia and also anem hemoglobin is up to 8.9  from 5.7 after transfusion of 2 units on 02/22/18  ,  - Aranesp and iron infusion by nephrologist  4)Secondary Hyperparathyroidism--- patient with PTH of 540 and phosphorous of 9.4, c/n Auryxia per nephrologist  5)Vitamin D deficiency--- vitamin D is low at 12, replace with 50,000 units weekly  Disposition/Need for in-Hospital Stay- patient unable to be discharged at this time due to lack of renal function recovery, pt was initiated on HD on 02/24/18, ,  will need permanent HD access placement by vascular surgery, patient will need to be clipped in order to get outpatient hemodialysis prior to discharge  Code Status : Full   Family Communication:   Husband and son at bedside  Disposition Plan  : home after she has been "clipped" for outpatient hemodialysis  Consults  :  Nephrology  DVT Prophylaxis  :   - SCDs   Lab Results  Component Value Date   PLT 266 02/26/2018    Inpatient Medications  Scheduled Meds: . calcitRIOL  0.25 mcg Oral Daily  . Chlorhexidine Gluconate Cloth  6  each Topical [id number]  . darbepoetin (ARANESP) injection - NON-DIALYSIS  60 mcg Subcutaneous Q Thu-1800  . ferric citrate  630 mg Oral TID WC  . Vitamin D (Ergocalciferol)  50,000 Units Oral Q7 days   Continuous Infusions:  PRN Meds:.    Anti-infectives (From admission, onward)   Start     Dose/Rate Route Frequency Ordered Stop   02/24/18 1534  ceFAZolin (ANCEF) powder       As needed 02/24/18 1534 02/24/18 1534    02/24/18 1518  ceFAZolin (ANCEF) 2-4 GM/100ML-% IVPB    Note to Pharmacy:  [place]   : cabinet override      02/24/18 1518 02/25/18 0329   02/23/18 1300  ceFAZolin (ANCEF) IVPB 2g/100 mL premix     2 g 200 mL/hr over 30 Minutes Intravenous  Once 02/23/18 1157 02/24/18 0038        Objective:   Vitals:   02/26/18 1030 02/26/18 1100 02/26/18 2131 02/27/18 0637  BP: 124/82 128/76 115/73 114/85  Pulse: 81 79 85 77  Resp: '16 17 18   '$ Temp:  98 F (36.7 C) 98.6 F (37 C) 98.4 F (36.9 C)  TempSrc:  Oral Oral Oral  SpO2:  100% 98% 99%  Weight:  70.5 kg  70.3 kg  Height:        Wt Readings from Last 3 Encounters:  02/27/18 70.3 kg  02/22/18 73 kg  02/21/18 72.1 kg     Intake/Output Summary (Last 24 hours) at 02/27/2018 1625 Last data filed at 02/27/2018 0945 Gross per 24 hour  Intake 840 ml  Output -  Net 840 ml     Physical Exam Patient is examined daily including today on 02/27/18 , exams remain the same as of yesterday except that has changed   Gen:- Awake Alert,  no apparent distress HEENT:- [place].AT, No sclera icterus Neck-Supple Neck,No JVD,. Rt IJ HD catheter site is clean dry and intact Lungs-  CTAB , fair symmetrical air movement CV- S1, S2 normal, regular  Abd-  +ve B.Sounds, Abd Soft, No tenderness,    Extremity/Skin:- No  edema, pedal pulses present Psych-affect is appropriate, oriented x3 Neuro-no new focal deficits, no tremors   Data Review:   Micro Results Recent Results (from the past 240 hour(s))  Surgical pcr screen     Status: None   Collection Time: 02/23/18 10:09 PM  Result Value Ref Range Status   MRSA, PCR NEGATIVE NEGATIVE Final   Staphylococcus aureus NEGATIVE NEGATIVE Final    Comment: (NOTE) The Xpert SA Assay (FDA approved for NASAL specimens in patients 51 years of age and older), is one component of a comprehensive surveillance program. It is not intended to diagnose infection nor to guide or monitor treatment. Performed at  [facility], [place] [address] [place], [place] [address]     Radiology Reports US Renal  Result Date: 02/22/2018 CLINICAL DATA:  Acute renal failure EXAM: RENAL / URINARY TRACT ULTRASOUND COMPLETE COMPARISON:  None. FINDINGS: Right Kidney: Renal measurements: 7.35 x 3.06 x 2.94 cm = volume: 34.62 mL. There is a tiny amount of perinephric fluid adjacent to the superior pole. Diffuse increased cortical echogenicity. Left Kidney: Renal measurements: 8.68 x 3.32 x 3.26 cm = volume: 49.19 mL. Minimal fullness of the collecting system. No gross hydronephrosis. Bladder: Appears normal for degree of bladder distention. IMPRESSION: 1. A small echogenic kidneys consistent with medical renal disease. 2. A tiny amount of fluid adjacent to the superior pole the right  kidney is age indeterminate nonspecific. 3. Minimal fullness in the left collecting system of doubtful acute significance. Electronically Signed   By: Dorise Bullion III M.D   On: 02/22/2018 11:57   Ir Fluoro Guide Cv Line Right  Result Date: 02/24/2018 INDICATION: 43 year old female with a history of renal failure EXAM: TUNNELED CENTRAL VENOUS HEMODIALYSIS CATHETER PLACEMENT WITH ULTRASOUND AND FLUOROSCOPIC GUIDANCE MEDICATIONS: 2.0 g Ancef. The antibiotic was given in an appropriate time interval prior to skin puncture. ANESTHESIA/SEDATION: Moderate (conscious) sedation was employed during this procedure. A total of Versed 1.0 mg and Fentanyl 75 mcg was administered intravenously. Moderate Sedation Time: 17 minutes. The patient's level of consciousness and vital signs were monitored continuously by radiology nursing throughout the procedure under my direct supervision. FLUOROSCOPY TIME:  Fluoroscopy Time: 0 minutes 12 seconds (0.5 mGy). COMPLICATIONS: None PROCEDURE: Informed written consent was obtained from the patient after a discussion of the risks, benefits, and alternatives to treatment. Questions regarding the procedure were encouraged and  answered. The right neck and chest were prepped with chlorhexidine in a sterile fashion, and a sterile drape was applied covering the operative field. Maximum barrier sterile technique with sterile gowns and gloves were used for the procedure. A timeout was performed prior to the initiation of the procedure. After creating a small venotomy incision, a micropuncture kit was utilized to access the right internal jugular vein under direct, real-time ultrasound guidance after the overlying soft tissues were anesthetized with 1% lidocaine with epinephrine. Ultrasound image documentation was performed. The microwire was marked to measure appropriate internal catheter length. External tunneled length was estimated. A total tip to cuff length of 19 cm was selected. Skin and subcutaneous tissues of chest wall below the clavicle were generously infiltrated with 1% lidocaine for local anesthesia. A small stab incision was made with 11 blade scalpel. The selected hemodialysis catheter was tunneled in a retrograde fashion from the anterior chest wall to the venotomy incision. A guidewire was advanced to the level of the IVC and the micropuncture sheath was exchanged for a peel-away sheath. The catheter was then placed through the peel-away sheath with tips ultimately positioned within the superior aspect of the right atrium. Final catheter positioning was confirmed and documented with a spot radiographic image. The catheter aspirates and flushes normally. The catheter was flushed with appropriate volume heparin dwells. The catheter exit site was secured with a 0-Prolene retention suture. The venotomy incision was closed Derma bond and sterile dressing. Dressings were applied at the chest wall. Patient tolerated the procedure well and remained hemodynamically stable throughout. No complications were encountered and no significant blood loss encountered. IMPRESSION: Status post right IJ tunneled hemodialysis catheter placement.  Catheter ready for use. Signed, Dulcy Fanny. Dellia Nims, RPVI [facility] Radiology Electronically Signed   By: Corrie Mckusick D.O.   On: 02/24/2018 16:28   Ir US Guide Vasc Access Right  Result Date: 02/24/2018 INDICATION: 43 year old female with a history of renal failure EXAM: TUNNELED CENTRAL VENOUS HEMODIALYSIS CATHETER PLACEMENT WITH ULTRASOUND AND FLUOROSCOPIC GUIDANCE MEDICATIONS: 2.0 g Ancef. The antibiotic was given in an appropriate time interval prior to skin puncture. ANESTHESIA/SEDATION: Moderate (conscious) sedation was employed during this procedure. A total of Versed 1.0 mg and Fentanyl 75 mcg was administered intravenously. Moderate Sedation Time: 17 minutes. The patient's level of consciousness and vital signs were monitored continuously by radiology nursing throughout the procedure under my direct supervision. FLUOROSCOPY TIME:  Fluoroscopy Time: 0 minutes 12 seconds (0.5 mGy).  COMPLICATIONS: None PROCEDURE: Informed written consent was obtained from the patient after a discussion of the risks, benefits, and alternatives to treatment. Questions regarding the procedure were encouraged and answered. The right neck and chest were prepped with chlorhexidine in a sterile fashion, and a sterile drape was applied covering the operative field. Maximum barrier sterile technique with sterile gowns and gloves were used for the procedure. A timeout was performed prior to the initiation of the procedure. After creating a small venotomy incision, a micropuncture kit was utilized to access the right internal jugular vein under direct, real-time ultrasound guidance after the overlying soft tissues were anesthetized with 1% lidocaine with epinephrine. Ultrasound image documentation was performed. The microwire was marked to measure appropriate internal catheter length. External tunneled length was estimated. A total tip to cuff length of 19 cm was selected. Skin and  subcutaneous tissues of chest wall below the clavicle were generously infiltrated with 1% lidocaine for local anesthesia. A small stab incision was made with 11 blade scalpel. The selected hemodialysis catheter was tunneled in a retrograde fashion from the anterior chest wall to the venotomy incision. A guidewire was advanced to the level of the IVC and the micropuncture sheath was exchanged for a peel-away sheath. The catheter was then placed through the peel-away sheath with tips ultimately positioned within the superior aspect of the right atrium. Final catheter positioning was confirmed and documented with a spot radiographic image. The catheter aspirates and flushes normally. The catheter was flushed with appropriate volume heparin dwells. The catheter exit site was secured with a 0-Prolene retention suture. The venotomy incision was closed Derma bond and sterile dressing. Dressings were applied at the chest wall. Patient tolerated the procedure well and remained hemodynamically stable throughout. No complications were encountered and no significant blood loss encountered. IMPRESSION: Status post right IJ tunneled hemodialysis catheter placement. Catheter ready for use. Signed, Dulcy Fanny. Dellia Nims, RPVI [facility] Radiology Electronically Signed   By: Corrie Mckusick D.O.   On: 02/24/2018 16:28   Vas [place] Lower Extremity Venous (dvt)  Result Date: 02/23/2018  Lower Venous Study Indications: Swelling.  Performing Technologist: Oliver Hum RVT  Examination Guidelines: A complete evaluation includes B-mode imaging, spectral Doppler, color Doppler, and power Doppler as needed of all accessible portions of each vessel. Bilateral testing is considered an integral part of a complete examination. Limited examinations for reoccurring indications may be performed as noted.  Right Venous Findings: +---------+---------------+---------+-----------+----------+-------+           CompressibilityPhasicitySpontaneityPropertiesSummary +---------+---------------+---------+-----------+----------+-------+ CFV      Full           Yes      Yes                          +---------+---------------+---------+-----------+----------+-------+ SFJ      Full                                                 +---------+---------------+---------+-----------+----------+-------+ FV Prox  Full                                                 +---------+---------------+---------+-----------+----------+-------+ FV Mid   Full                                                 +---------+---------------+---------+-----------+----------+-------+  FV DistalFull                                                 +---------+---------------+---------+-----------+----------+-------+ PFV      Full                                                 +---------+---------------+---------+-----------+----------+-------+ POP      Full           Yes      Yes                          +---------+---------------+---------+-----------+----------+-------+ PTV      Full                                                 +---------+---------------+---------+-----------+----------+-------+ PERO     Full                                                 +---------+---------------+---------+-----------+----------+-------+  Left Venous Findings: +---------+---------------+---------+-----------+----------+-------+          CompressibilityPhasicitySpontaneityPropertiesSummary +---------+---------------+---------+-----------+----------+-------+ CFV      Full           Yes      Yes                          +---------+---------------+---------+-----------+----------+-------+ SFJ      Full                                                 +---------+---------------+---------+-----------+----------+-------+ FV Prox  Full                                                  +---------+---------------+---------+-----------+----------+-------+ FV Mid   Full                                                 +---------+---------------+---------+-----------+----------+-------+ FV DistalFull                                                 +---------+---------------+---------+-----------+----------+-------+ PFV      Full                                                 +---------+---------------+---------+-----------+----------+-------+   POP      Full           Yes      Yes                          +---------+---------------+---------+-----------+----------+-------+ PTV      Full                                                 +---------+---------------+---------+-----------+----------+-------+ PERO     Full                                                 +---------+---------------+---------+-----------+----------+-------+    Summary: Right: There is no evidence of deep vein thrombosis in the lower extremity. No cystic structure found in the popliteal fossa. Left: There is no evidence of deep vein thrombosis in the lower extremity. No cystic structure found in the popliteal fossa.  *See table(s) above for measurements and observations. Electronically signed by Servando Snare MD on 02/23/2018 at 1:43:48 PM.    Final    Vas [place] Upper Ext Vein Mapping (pre-op Avf)  Result Date: 02/27/2018 UPPER EXTREMITY VEIN MAPPING  Indications: Pre-op dialysis access. Performing Technologist: Sharion Dove RVS  Examination Guidelines: A complete evaluation includes B-mode imaging, spectral Doppler, color Doppler, and power Doppler as needed of all accessible portions of each vessel. Bilateral testing is considered an integral part of a complete examination. Limited examinations for reoccurring indications may be performed as noted. +-----------------+-------------+----------+--------------------------------+ Right Cephalic   Diameter (cm)Depth (cm)            Findings              +-----------------+-------------+----------+--------------------------------+ Prox upper arm       0.43        0.69                                    +-----------------+-------------+----------+--------------------------------+ Mid upper arm        0.37        0.57              branching             +-----------------+-------------+----------+--------------------------------+ Dist upper arm       0.43        0.32                                    +-----------------+-------------+----------+--------------------------------+ Antecubital fossa    0.50        0.31              branching             +-----------------+-------------+----------+--------------------------------+ Prox forearm         0.41        0.57              branching             +-----------------+-------------+----------+--------------------------------+ Mid forearm          0.42        0.65                                    +-----------------+-------------+----------+--------------------------------+  Wrist                0.36        0.50   branching and thrombus in branch +-----------------+-------------+----------+--------------------------------+ +-----------------+-------------+----------+---------+ Left Cephalic    Diameter (cm)Depth (cm)Findings  +-----------------+-------------+----------+---------+ Prox upper arm       0.52        0.88             +-----------------+-------------+----------+---------+ Mid upper arm        0.50        0.79             +-----------------+-------------+----------+---------+ Dist upper arm       0.29        0.35   branching +-----------------+-------------+----------+---------+ Antecubital fossa    0.62        0.34             +-----------------+-------------+----------+---------+ Prox forearm         0.50        0.79   branching +-----------------+-------------+----------+---------+ Mid forearm          0.40        0.63              +-----------------+-------------+----------+---------+ Wrist                0.41        0.86   branching +-----------------+-------------+----------+---------+ *See table(s) above for measurements and observations.  Diagnosing physician:    Preliminary      CBC Recent Labs  Lab 02/21/18 1544 02/22/18 1006 02/22/18 2023 02/23/18 0539 02/24/18 0451 02/26/18 0450  WBC 8.6 7.9  --  7.1 7.3 8.2  HGB 5.7* 5.7* 7.6* 7.9* 8.0* 8.9*  HCT 17.3* 18.7* 24.3* 25.2* 25.6* 28.1*  PLT 396 337  --  306 285 266  MCV 80.5 88.6  --  88.4 85.6 84.9  MCH 26.5* 27.0  --  27.7 26.8 26.9  MCHC 32.9 30.5  --  31.3 31.3 31.7  RDW 15.9* 16.0*  --  15.8* 15.2 15.2  LYMPHSABS 1,273 1.2  --   --   --   --   MONOABS  --  0.3  --   --   --   --   EOSABS 301 0.3  --   --   --   --   BASOSABS 26 0.0  --   --   --   --     Chemistries  Recent Labs  Lab 02/21/18 1541 02/22/18 1006 02/22/18 1423 02/23/18 0539 02/24/18 0451 02/26/18 0450  NA 140 139  --  144 140 140  K 4.6 4.1  --  4.4 4.2 3.9  CL 112* 114*  --  115* 113* 107  CO2 12* 13*  --  11* 14* 17*  GLUCOSE 77 92  --  89 89 92  BUN 85* 92*  --  88* 91* 66*  CREATININE 10.65* 10.76*  --  10.57* 10.60* 8.92*  CALCIUM 8.9 8.9 8.9 8.8* 8.6* 9.6  AST 6* 7*  --   --   --   --   ALT 8 11  --   --   --   --   ALKPHOS  --  72  --   --   --   --   BILITOT 0.3 0.2*  --   --   --   --    ------------------------------------------------------------------------------------------------------------------ No results for input(s): CHOL, HDL, LDLCALC, TRIG, CHOLHDL, LDLDIRECT in the  last 72 hours.  Lab Results  Component Value Date   HGBA1C 5.7 (H) 02/24/2018   Coagulation profile Recent Labs  Lab 02/23/18 1232  INR 1.11    Roxan Hockey M.D on 02/27/2018 at 4:25 PM  Go to [email or web address] - for contact info  [facility] - Office  [phone]

## 2018-02-27 NOTE — Plan of Care (Signed)
  Problem: Education: Goal: Knowledge of General Education information will improve Description: Including pain rating scale, medication(s)/side effects and non-pharmacologic comfort measures Outcome: Progressing   Problem: Clinical Measurements: Goal: Ability to maintain clinical measurements within normal limits will improve Outcome: Progressing   Problem: Clinical Measurements: Goal: Diagnostic test results will improve Outcome: Progressing   Problem: Nutrition: Goal: Adequate nutrition will be maintained Outcome: Progressing   Problem: Coping: Goal: Level of anxiety will decrease Outcome: Progressing   Problem: Pain Managment: Goal: General experience of comfort will improve Outcome: Progressing   

## 2018-02-27 NOTE — Progress Notes (Signed)
VASCULAR LAB PRELIMINARY  PRELIMINARY  PRELIMINARY  PRELIMINARY  Bilateral upper extremity vein mapping completed.    Preliminary report:  See CV Proc  Myisha Pickerel, RVT 02/27/2018, 2:09 PM

## 2018-02-27 NOTE — Progress Notes (Signed)
[facility] NEPHR[facility] PROGRESS NOTE  Assessment/ Plan: Pt is a 43 y.o. yo female with past medical history of hypertension for about 3 years, supposed to be on HCTZ but not taking, menorrhagia, recently diagnosed hypertensive retinopathy by her ophthalmologist, referred by PCP to the ER for the management of severe anemia and elevated serum creatinine level found on routine lab.   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  BP readings are however in acceptable range without any antihypertensive medication. Ultrasound kidneys with bilateral atrophic kidney and increased cortical echogenicity. She has hypertensive retinopathy. Urinalysis with no RBC, no WBC, PCR 3.2.  Patient is nonoliguric. -complements, HepBsAg, HIV negative. -Follow up protein electrophoresis, hepatitis C antibody,  ANA, ANCA,  -PTH elevated and atrophic kidneys I doubt there is any significant renal recovery.  Patient  has nausea, insomnia, dysgeusia and generalized weakness symptoms consistent of uremia.  I do not think biopsy will be helpful given atrophic kidneys.  Patient received first dialysis on 2/7 via right IJ TDC placed by IR, tolerated well.  Plan for third treatment today.  No ultrafiltration. Had vein mapping today.  -will need CLIP  #Anemia due to acute blood loss and CKD: Received 2 units of red blood cell transfusion.  Given history of menorrhagia I recommend GYN consult/pelvic ultrasound.   -Continue IV iron and Aranesp.  #Secondary hyperparathyroidism: PTH 540, high phosphorus.  Started Lorin Picket.  Calcitriol.  #History of hypertension: Monitor blood pressure.  Subjective: Seen and examined.  Overall feeling quite well.  Denies chest pain, shortness of breath, nausea, vomiting, diarrhea or dysuria.  All other review of systems are negative.    Objective Vital signs in last 24 hours: Vitals:   02/26/18 1030 02/26/18 1100 02/26/18 2131 02/27/18 0637  BP: 124/82 128/76 115/73 114/85  Pulse: 81 79 85 77  Resp: 16 17 18    Temp:  98 F (36.7 C) 98.6 F (37 C) 98.4 F (36.9 C)  TempSrc:  Oral Oral Oral  SpO2:  100% 98% 99%  Weight:  70.5 kg  70.3 kg  Height:       Weight change: 0.056 kg  Intake/Output Summary (Last 24 hours) at 02/27/2018 1634 Last data filed at 02/27/2018 0945 Gross per 24 hour  Intake 840 ml  Output -  Net 840 ml       Labs: Basic Metabolic Panel: Recent Labs  Lab 02/23/18 0539 02/24/18 0451 02/26/18 0450  NA 144 140 140  K 4.4 4.2 3.9  CL 115* 113* 107  CO2 11* 14* 17*  GLUCOSE 89 89 92  BUN 88* 91* 66*  CREATININE 10.57* 10.60* 8.92*  CALCIUM 8.8* 8.6* 9.6  PHOS 9.4* 7.9* 6.8*   Liver Function Tests: Recent Labs  Lab 02/21/18 1541 02/22/18 1006 02/24/18 0451 02/26/18 0450  AST 6* 7*  --   --   ALT 8 11  --   --   ALKPHOS  --  72  --   --   BILITOT 0.3 0.2*  --   --   PROT 7.5 8.2*  --   --   ALBUMIN  --  3.8 3.1* 3.1*   No results for input(s): LIPASE, AMYLASE in the last 168 hours. No results for input(s): AMMONIA in the last 168 hours. CBC: Recent Labs  Lab 02/21/18 1544 02/22/18 1006  02/23/18 0539 02/24/18 0451 02/26/18 0450  WBC 8.6 7.9  --  7.1 7.3 8.2  NEUTROABS 6,631 6.2  --   --   --   --   HGB 5.7* 5.7*   < > 7.9* 8.0* 8.9*  HCT 17.3* 18.7*   < > 25.2* 25.6* 28.1*  MCV 80.5 88.6  --  88.4 85.6 84.9  PLT 396 337  --  306 285 266   < > = values in this interval not displayed.   Cardiac Enzymes: No results for input(s): CKTOTAL, CKMB, CKMBINDEX, TROPONINI in the last 168 hours. CBG: No results for input(s): GLUCAP in the last 168 hours.  Iron Studies:  No results for input(s): IRON, TIBC, TRANSFERRIN, FERRITIN in the last 72 hours. Studies/Results: Vas [place] Upper Ext Vein Mapping (pre-op Avf)  Result Date: 02/27/2018 UPPER EXTREMITY VEIN MAPPING  Indications: Pre-op dialysis access. Performing  Technologist: Sharion Dove RVS  Examination Guidelines: A complete evaluation includes B-mode imaging, spectral Doppler, color Doppler, and power Doppler as needed of all accessible portions of each vessel. Bilateral testing is considered an integral part of a complete examination. Limited examinations for reoccurring indications may be performed as noted. +-----------------+-------------+----------+--------------------------------+ Right Cephalic   Diameter (cm)Depth (cm)            Findings             +-----------------+-------------+----------+--------------------------------+ Prox upper arm       0.43        0.69                                    +-----------------+-------------+----------+--------------------------------+ Mid upper arm        0.37        0.57              branching             +-----------------+-------------+----------+--------------------------------+ Dist upper arm       0.43        0.32                                    +-----------------+-------------+----------+--------------------------------+ Antecubital fossa    0.50        0.31              branching             +-----------------+-------------+----------+--------------------------------+ Prox forearm         0.41        0.57              branching             +-----------------+-------------+----------+--------------------------------+ Mid forearm          0.42        0.65                                    +-----------------+-------------+----------+--------------------------------+ Wrist                0.36        0.50   branching and thrombus in branch +-----------------+-------------+----------+--------------------------------+ +-----------------+-------------+----------+---------+ Left Cephalic    Diameter (cm)Depth (cm)Findings  +-----------------+-------------+----------+---------+ Prox upper arm       0.52        0.88              +-----------------+-------------+----------+---------+ Mid upper arm  0.50        0.79             +-----------------+-------------+----------+---------+ Dist upper arm       0.29        0.35   branching +-----------------+-------------+----------+---------+ Antecubital fossa    0.62        0.34             +-----------------+-------------+----------+---------+ Prox forearm         0.50        0.79   branching +-----------------+-------------+----------+---------+ Mid forearm          0.40        0.63             +-----------------+-------------+----------+---------+ Wrist                0.41        0.86   branching +-----------------+-------------+----------+---------+ *See table(s) above for measurements and observations.  Diagnosing physician:    Preliminary     Medications: Infusions:   Scheduled Medications: . calcitRIOL  0.25 mcg Oral Daily  . Chlorhexidine Gluconate Cloth  6 each Topical Q0600  . darbepoetin (ARANESP) injection - NON-DIALYSIS  60 mcg Subcutaneous Q Thu-1800  . ferric citrate  630 mg Oral TID WC  . Vitamin D (Ergocalciferol)  50,000 Units Oral Q7 days    have reviewed scheduled and prn medications.  Physical Exam: General: Not in distress, comfortable Heart:RRR, s1s2 nl no rubs Lungs: Clear bilateral, no wheezing or crackles Abdomen:soft, Non-tender, non-distended Extremities: No edema Neurology: Alert, awake, no asterixis. Skin: No rashes Psych: Good affect  Markeise Mathews A Travonta Gill 02/27/2018,4:34 PM  LOS: 5 days

## 2018-02-28 DIAGNOSIS — N178 Other acute kidney failure: Secondary | ICD-10-CM

## 2018-02-28 MED ORDER — HEPARIN SODIUM (PORCINE) 1000 UNIT/ML IJ SOLN
INTRAMUSCULAR | Status: AC
  Administered 2018-02-28: 1000 [IU] via INTRAVENOUS_CENTRAL
  Filled 2018-02-28: qty 4

## 2018-02-28 MED ORDER — ACETAMINOPHEN 325 MG PO TABS
650.0000 mg | ORAL_TABLET | Freq: Four times a day (QID) | ORAL | Status: DC | PRN
  Filled 2018-02-28: qty 2

## 2018-02-28 MED ORDER — HYDROCODONE-ACETAMINOPHEN 5-325 MG PO TABS: 1.0000 | ORAL_TABLET | Freq: Four times a day (QID) | ORAL | Status: DC | PRN

## 2018-02-28 NOTE — Progress Notes (Signed)
HD tx initiated via HD cath w/o problem Bilat ports: pull/push/flush equally w/o problem VSS Will continue to monitor while on HD tx 

## 2018-02-28 NOTE — Clinical Social Work Note (Addendum)
Received call from [place] on HD unit asking for patient's pending Medicaid number so they can set her up with an outpatient HD center. According to chart patient does not have a pending Medicaid application. Emailed Development worker, community to check status of Medicaid screening and asked her to call Freda Munro with information.  Dayton Scrape, [place] [phone]  3:30 pm Received email from financial counselor stating that she was faxing the Medicaid application and had called Freda Munro to notify.  CSW signing off.  Dayton Scrape, [place]

## 2018-02-28 NOTE — Progress Notes (Signed)
HD tx completed @ 0155 w/bp issues throughout tx, pt did not tolerate attempt at UF removal. bp dropped and pt was symptomatic, it took 2 - 271m NS boluses to get bp back up and pt not feeling so bad anymore. Pt c/o abdominal pain stating that while we were pulling fluid it made her feel like she couldn't breath and caused her cramping in her abdomen. Also had cath issues that the AP would spike and alarm suddenly and this was a constant thing until I reversed lines. I feel like perhaps the line is up against the wall and needs to be pulled back some so that pt doesn't need to be ran reversed. UF goal not met  Blood rinsed back VSS w/ soft bp Report called to EHermina Barters RN

## 2018-02-28 NOTE — Consult Note (Addendum)
REASON FOR CONSULT:    To evaluate for hemodialysis access.  The consult is requested by the nephrology service.  HPI:   Sara Meza is a pleasant 43 y.o. female, who was admitted with some shortness of breath and fatigue.  She had abnormal labs and was found to have renal failure.  She is had a tunneled dialysis catheter placed by interventional radiology and we have been consulted for permanent access.  Of note she is right-handed.  Her shortness of breath has resolved.  She has no history of previous central lines or a pacemaker.  She currently denies any other uremic symptoms such as nausea vomiting or anorexia.  Past Medical History:  Diagnosis Date  . History of gestational diabetes 06/29/2012  . Hypertension     Family History  Problem Relation Age of Onset  . Diabetes Father   . Hypertension Father   . Diabetes Mother   . Hypertension Mother   . Colon cancer Sister   . Wilm's tumor Sister     SOCIAL HISTORY: Social History   Socioeconomic History  . Marital status: Married    Spouse name: Not on file  . Number of children: Not on file  . Years of education: Not on file  . Highest education level: Not on file  Occupational History  . Not on file  Social Needs  . Financial resource strain: Not on file  . Food insecurity:    Worry: Not on file    Inability: Not on file  . Transportation needs:    Medical: Not on file    Non-medical: Not on file  Tobacco Use  . Smoking status: Never Smoker  . Smokeless tobacco: Never Used  Substance and Sexual Activity  . Alcohol use: Not Currently  . Drug use: Never  . Sexual activity: Yes    Birth control/protection: Surgical, None  Lifestyle  . Physical activity:    Days per week: Not on file    Minutes per session: Not on file  . Stress: Not on file  Relationships  . Social connections:    Talks on phone: Not on file    Gets together: Not on file    Attends religious service: Not on file    Active member  of club or organization: Not on file    Attends meetings of clubs or organizations: Not on file    Relationship status: Not on file  . Intimate partner violence:    Fear of current or ex partner: Not on file    Emotionally abused: Not on file    Physically abused: Not on file    Forced sexual activity: Not on file  Other Topics Concern  . Not on file  Social History Narrative  . Not on file    Allergies  Allergen Reactions  . Lisinopril Other (See Comments)    irregular menses    Current Facility-Administered Medications  Medication Dose Route Frequency Provider Last Rate Last Dose  . 0.9 %  sodium chloride infusion  100 mL Intravenous PRN Rosita Fire, MD      . 0.9 %  sodium chloride infusion  100 mL Intravenous PRN Rosita Fire, MD      . acetaminophen (TYLENOL) tablet 650 mg  650 mg Oral Q6H PRN Bodenheimer, Charles A, NP      . alteplase (CATHFLO ACTIVASE) injection 2 mg  2 mg Intracatheter Once PRN Rosita Fire, MD      . calcitRIOL (ROCALTROL) capsule  0.25 mcg  0.25 mcg Oral Daily Rosita Fire, MD   0.25 mcg at 02/28/18 0846  . Chlorhexidine Gluconate Cloth 2 % PADS 6 each  6 each Topical Q0600 Rosita Fire, MD   6 each at 02/27/18 0149  . Darbepoetin Alfa (ARANESP) injection 60 mcg  60 mcg Subcutaneous Q Thu-1800 Rosita Fire, MD   60 mcg at 02/23/18 1725  . ferric citrate (AURYXIA) tablet 630 mg  630 mg Oral TID WC Rosita Fire, MD   630 mg at 02/28/18 1259  . heparin injection 1,000 Units  1,000 Units Dialysis PRN Rosita Fire, MD   1,000 Units at 02/28/18 0200  . HYDROcodone-acetaminophen (NORCO/VICODIN) 5-325 MG per tablet 1-2 tablet  1-2 tablet Oral Q6H PRN Bodenheimer, Charles A, NP      . lidocaine (PF) (XYLOCAINE) 1 % injection 5 mL  5 mL Intradermal PRN Rosita Fire, MD      . lidocaine-prilocaine (EMLA) cream 1 application  1 application Topical PRN Rosita Fire, MD      .  pentafluoroprop-tetrafluoroeth Landry Dyke) aerosol 1 application  1 application Topical PRN Rosita Fire, MD      . Vitamin D (Ergocalciferol) (DRISDOL) capsule 50,000 Units  50,000 Units Oral Q7 days Roxan Hockey, MD        REVIEW OF SYSTEMS:  [X]  denotes positive finding, [ ]  denotes negative finding Cardiac  Comments:  Chest pain or chest pressure:    Shortness of breath upon exertion: x   Short of breath when lying flat:    Irregular heart rhythm:        Vascular    Pain in calf, thigh, or hip brought on by ambulation:    Pain in feet at night that wakes you up from your sleep:     Blood clot in your veins:    Leg swelling:         Pulmonary    Oxygen at home:    Productive cough:     Wheezing:         Neurologic    Sudden weakness in arms or legs:     Sudden numbness in arms or legs:     Sudden onset of difficulty speaking or slurred speech:    Temporary loss of vision in one eye:     Problems with dizziness:         Gastrointestinal    Blood in stool:     Vomited blood:         Genitourinary    Burning when urinating:     Blood in urine:        Psychiatric    Major depression:         Hematologic    Bleeding problems:    Problems with blood clotting too easily:        Skin    Rashes or ulcers:        Constitutional    Fever or chills:     PHYSICAL EXAM:   Vitals:   02/28/18 0130 02/28/18 0155 02/28/18 0202 02/28/18 0235  BP: (!) 94/56 (!) 97/57 102/60 123/81  Pulse: 78 80 83 82  Resp: (!) 22 (!) 27 (!) 21 18  Temp:   98.5 F (36.9 C) 98.4 F (36.9 C)  TempSrc:   Oral Oral  SpO2: 99% 99% 98% 100%  Weight:   69.9 kg 71.8 kg  Height:       GENERAL: The patient is a  well-nourished female, in no acute distress. The vital signs are documented above. CARDIAC: There is a regular rate and rhythm.  VASCULAR: I do not detect carotid bruits. She has palpable brachial and radial pulses bilaterally. She has palpable posterior tibial pulses  bilaterally. She does have a IV in the left forearm and the cephalic vein. She has a right IJ tunneled dialysis catheter. PULMONARY: There is good air exchange bilaterally without wheezing or rales. ABDOMEN: Soft and non-tender with normal pitched bowel sounds.  MUSCULOSKELETAL: There are no major deformities or cyanosis. NEUROLOGIC: No focal weakness or paresthesias are detected. SKIN: There are no ulcers or rashes noted. PSYCHIATRIC: The patient has a normal affect.  DATA:    VEIN MAP: I have independently interpreted her vein map.  Her forearm and upper arm cephalic vein on the left appear reasonable in size were evaluated.  There was some narrowing in the distal upper arm noted.  On the left side the forearms and upper arm cephalic vein are good caliber and appear to be usable for a fistula.  LABS: GFR is 6.  White blood cell count is 10.3.  Hemoglobin is 8.2.  Platelet count 234,000.  Potassium is 3.4.   ASSESSMENT & PLAN:   ACUTE RENAL FAILURE: This patient was admitted with acute renal failure.  Her renal insufficiency is likely secondary to hypertension.  She currently has a tunneled dialysis catheter and is using this for dialysis.  We have been asked to provide hemodialysis access.  Although she is right-handed she has an IV in the forearm cephalic vein on the left in the upper arm cephalic vein narrows down in one area.  I have recommended placement of a right forearm or upper arm AV fistula. I have explained the indications for placement of an AV fistula or AV graft. I've explained that if at all possible we will place an AV fistula.  I have reviewed the risks of placement of an AV fistula including but not limited to: failure of the fistula to mature, need for subsequent interventions, and thrombosis. In addition I have reviewed the potential complications of placement of an AV graft. These risks include, but are not limited to, graft thrombosis, graft infection, wound healing  problems, bleeding, arm swelling, and steal syndrome. All the patient's questions were answered and they are agreeable to proceed with surgery.  Her surgery is scheduled for Thursday.  The patient is scheduled for surgery on Thursday.  Please do not do hemodialysis on Thursday.  Deitra Mayo [facility] [phone]

## 2018-02-28 NOTE — Progress Notes (Signed)
[facility] PRO[facility]ESS [facility]  Assessment/ Plan: Pt is a 43 y.o. yo female with past medical history of hypertension for about 3 years, supposed to be on HCTZ but not taking, menorrhagia, recently diagnosed hypertensive retinopathy by her ophthalmologist, referred by PCP to the ER for the management of severe anemia and elevated serum creatinine level found on routine lab.   #AKI versus progressive CKD:  Suspected CKD progressed to ESRD now, possibly due to HTN (though BP ok off meds now) versus undiagnosed glomerular (proteinuria).  Ultrasound kidneys with bilateral atrophic kidney and increased cortical echogenicity. She does have hypertensive retinopathy. Urinalysis with no RBC, no WBC, PCR 3.2.  Patient is nonoliguric. -complements, HepBsAg, HIV, SPEP negative. -Follow up hepatitis C antibody,  ANA, ANCA -PTH elevated and atrophic kidneys I doubt there is any significant renal recovery.  Patient  has nausea, insomnia, dysgeusia and generalized weakness symptoms consistent of uremia.  I do not think biopsy will be helpful given atrophic kidneys.  Patient received first dialysis on 2/7, 3rd 2/10.  Plan for third treatment today.  No ultrafiltration.  -Vein mapping complete - Appreciate [facility]VS consult re perm access.  Plan Thurs AVF. -CLIP underway  #Anemia due to acute blood loss and CKD: Received 2 units of red blood cell transfusion.  Given history of menorrhagia I recommend GYN consult/pelvic ultrasound.   -Continue IV iron and Aranesp.  #Secondary hyperparathyroidism: PTH 540, high phosphorus.  Started Lorin Picket.  Phos improved to 4.8. Calcitriol.  #History of hypertension: Monitor blood pressure.  Subjective: Seen and examined.  Overall feeling quite well.  Denies chest pain, shortness of breath, nausea, vomiting, diarrhea or dysuria.  Had fatigue post HD yesterday. All other review of systems are negative.    Objective Vital signs in last 24 hours: Vitals:    02/28/18 0130 02/28/18 0155 02/28/18 0202 02/28/18 0235  BP: (!) 94/56 (!) 97/57 102/60 123/81  Pulse: 78 80 83 82  Resp: (!) 22 (!) 27 (!) 21 18  Temp:   98.5 F (36.9 C) 98.4 F (36.9 C)  TempSrc:   Oral Oral  SpO2: 99% 99% 98% 100%  Weight:   69.9 kg 71.8 kg  Height:       Weight change: -0.6 kg  Intake/Output Summary (Last 24 hours) at 02/28/2018 1409 Last data filed at 02/28/2018 0956 Gross per 24 hour  Intake 240 ml  Output -30 ml  Net 270 ml       Labs: Basic Metabolic Panel: Recent Labs  Lab 02/24/18 0451 02/26/18 0450 02/27/18 2222  NA 140 140 136  K 4.2 3.9 3.4*  CL 113* 107 102  CO2 14* 17* 22  GLUCOSE 89 92 124*  BUN 91* 66* 53*  CREATININE 10.60* 8.92* 7.73*  CALCIUM 8.6* 9.6 9.1  PHOS 7.9* 6.8* 4.8*   Liver Function Tests: Recent Labs  Lab 02/21/18 1541  02/22/18 1006 02/24/18 0451 02/26/18 0450 02/27/18 2222  AST 6*  --  7*  --   --   --   ALT 8  --  11  --   --   --   ALKPHOS  --   --  72  --   --   --   BILITOT 0.3  --  0.2*  --   --   --   PROT 7.5  --  8.2*  --   --   --   ALBUMIN  --    < > 3.8 3.1* 3.1* 3.0*   < > =  values in this interval not displayed.   No results for input(s): LIPASE, AMYLASE in the last 168 hours. No results for input(s): AMMONIA in the last 168 hours. CBC: Recent Labs  Lab 02/21/18 1544 02/22/18 1006  02/23/18 0539 02/24/18 0451 02/26/18 0450 02/27/18 2222  WBC 8.6 7.9  --  7.1 7.3 8.2 10.3  NEUTROABS 6,631 6.2  --   --   --   --   --   HGB 5.7* 5.7*   < > 7.9* 8.0* 8.9* 8.2*  HCT 17.3* 18.7*   < > 25.2* 25.6* 28.1* 25.6*  MCV 80.5 88.6  --  88.4 85.6 84.9 86.5  PLT 396 337  --  306 285 266 234   < > = values in this interval not displayed.   Cardiac Enzymes: No results for input(s): CKTOTAL, CKMB, CKMBINDEX, TROPONINI in the last 168 hours. CBG: No results for input(s): GLUCAP in the last 168 hours.  Iron Studies:  No results for input(s): IRON, TIBC, TRANSFERRIN, FERRITIN in the last 72  hours. Studies/Results: Vas [place] Upper Ext Vein Mapping (pre-op Avf)  Result Date: 02/27/2018 UPPER EXTREMITY VEIN MAPPING  Indications: Pre-op dialysis access. Performing Technologist: Sharion Dove RVS  Examination Guidelines: A complete evaluation includes B-mode imaging, spectral Doppler, color Doppler, and power Doppler as needed of all accessible portions of each vessel. Bilateral testing is considered an integral part of a complete examination. Limited examinations for reoccurring indications may be performed as noted. +-----------------+-------------+----------+--------------------------------+ Right Cephalic   Diameter (cm)Depth (cm)            Findings             +-----------------+-------------+----------+--------------------------------+ Prox upper arm       0.43        0.69                                    +-----------------+-------------+----------+--------------------------------+ Mid upper arm        0.37        0.57              branching             +-----------------+-------------+----------+--------------------------------+ Dist upper arm       0.43        0.32                                    +-----------------+-------------+----------+--------------------------------+ Antecubital fossa    0.50        0.31              branching             +-----------------+-------------+----------+--------------------------------+ Prox forearm         0.41        0.57              branching             +-----------------+-------------+----------+--------------------------------+ Mid forearm          0.42        0.65                                    +-----------------+-------------+----------+--------------------------------+ Wrist                0.36  0.50   branching and thrombus in branch +-----------------+-------------+----------+--------------------------------+ +-----------------+-------------+----------+---------+ Left Cephalic    Diameter  (cm)Depth (cm)Findings  +-----------------+-------------+----------+---------+ Prox upper arm       0.52        0.88             +-----------------+-------------+----------+---------+ Mid upper arm        0.50        0.79             +-----------------+-------------+----------+---------+ Dist upper arm       0.29        0.35   branching +-----------------+-------------+----------+---------+ Antecubital fossa    0.62        0.34             +-----------------+-------------+----------+---------+ Prox forearm         0.50        0.79   branching +-----------------+-------------+----------+---------+ Mid forearm          0.40        0.63             +-----------------+-------------+----------+---------+ Wrist                0.41        0.86   branching +-----------------+-------------+----------+---------+ *See table(s) above for measurements and observations.  Diagnosing physician:    Preliminary     Medications: Infusions: . sodium chloride    . sodium chloride      Scheduled Medications: . calcitRIOL  0.25 mcg Oral Daily  . Chlorhexidine Gluconate Cloth  6 each Topical Q0600  . darbepoetin (ARANESP) injection - NON-DIALYSIS  60 mcg Subcutaneous Q Thu-1800  . ferric citrate  630 mg Oral TID WC  . Vitamin D (Ergocalciferol)  50,000 Units Oral Q7 days    have reviewed scheduled and prn medications.  Physical Exam: General: Not in distress, comfortable Heart:RRR, s1s2 nl no rubs Lungs: Clear bilateral, no wheezing or crackles Abdomen:soft, Non-tender, non-distended Extremities: No edema Neurology: Alert, awake Skin: No rashes Psych: pleasant affect  Justin Mend 02/28/2018,2:09 PM  LOS: 6 days

## 2018-02-28 NOTE — Progress Notes (Signed)
Patient Demographics:    Sara Meza, is a 43 y.o. female, DOB - July 01, 1975, NKN:[id number]  Admit date - 02/22/2018   Admitting Physician Costin Karlyne Greenspan, MD  Outpatient Primary MD for the patient is [name], PA-C  LOS - 6   Chief Complaint  Patient presents with  . Abnormal Lab        Subjective:    Sara Meza today has no fevers, no emesis,  No chest pain, difficulties with hemodialysis yesterday--uremic symptoms including nausea, dyspepsia, insomnia, weakness and fatigue persist   Assessment  & Plan :    Principal Problem:   CKD (chronic kidney disease), stage V (HCC) Active Problems:   Essential hypertension   Iron deficiency   Retinopathy   Renal failure  Brief Summary 43 year old with past medical history relevant for hypertension hypertensive retinopathy admitted on 02/22/2018 with worsening renal function, and non-anion gap metabolic acidosis-likely will be requiring hemodialysis, Bil LE Dopplers negative for DVT, patient was started on HD on 02/24/2018, had bilateral upper extremity vein mapping on 02/27/2018, awaiting permanent hemodialysis access placement on 03/02/18, CLIP underway  Plan:- 1)AOCKD Vs Progressive CKD-----Nephrology input appreciated, was initiated on HD on 02/24/18, ,  , had bilateral upper extremity vein mapping on 02/27/2018, awaiting permanent hemodialysis access placement by vascular surgery on 03/02/18, treated with  bicarb for non-anion gap metabolic acidosis, creatinine remains above 10, urine output adequate, renal ultrasound with bil atrophic kidneys, lab work-up for possible etiology of renal failure noted,hemoglobin A1c is 5.7, HIV negative, phosphorus was elevated at 9.4, trending down, c/n Calcitriol  And Auryxia, protein electrophoresis is unremarkable, hepatitis B is negative, hepatitis C is pending, complement testing reveals no complement  deficiency, ANA and ANCA titers are pending.... Per nephrologist kidney biopsy may not change management or outcome.... UA with glucosuria, hematuria and proteinuria--- uremic symptoms including nausea, dyspepsia, insomnia, weakness and fatigue persist  2)HTN--- history of hypertensive retinopathy, stable at this time, PTA was noncompliant with antihypertensives, patient denies history of significant elevation of BP, was apparently told about 3 years ago that she has stage I hypertension with systolic blood pressure in the 140s and 150s only, nephrologist advises holding off on anti-hypertensives at this time  3)Chronic Anemia of CKD -----patient with iron deficiency anemia secondary to menorrhagia and also anem hemoglobin is up to 8.1  from 5.7 after transfusion of 2 units on 02/22/18  ,  -  Aranesp and iron infusion by nephrologist  4)Secondary Hyperparathyroidism--- patient with PTH of 540 and phosphorous was 9.4, trending down, c/n calcitriol and  c/n Auryxia per nephrologist  5)Vitamin D deficiency--- vitamin D is low at 12, replace with 50,000 units weekly  Disposition/Need for in-Hospital Stay- patient unable to be discharged at this time due to lack of renal function recovery, pt was initiated on HD on 02/24/18, ,  will need permanent HD access placement by vascular surgery on 03/02/18, patient will need to be clipped in order to get outpatient hemodialysis prior to discharge  Code Status : Full   Family Communication:   Husband and son at bedside  Disposition Plan  : home after she has been "clipped" for outpatient hemodialysis  Consults  :  Nephrology  DVT Prophylaxis  :   -  SCDs   Lab Results  Component Value Date   PLT 234 02/27/2018    Inpatient Medications  Scheduled Meds: . calcitRIOL  0.25 mcg Oral Daily  . Chlorhexidine Gluconate Cloth  6 each Topical Q0600  . darbepoetin (ARANESP) injection - NON-DIALYSIS  60 mcg Subcutaneous Q Thu-1800  . ferric citrate  630 mg Oral TID  WC  . Vitamin D (Ergocalciferol)  50,000 Units Oral Q7 days   Continuous Infusions: . sodium chloride    . sodium chloride     PRN Meds:.   Anti-infectives (From admission, onward)   Start     Dose/Rate Route Frequency Ordered Stop   02/24/18 1534  ceFAZolin (ANCEF) powder       As needed 02/24/18 1534 02/24/18 1534   02/24/18 1518  ceFAZolin (ANCEF) 2-4 GM/100ML-% IVPB    Note to Pharmacy:  [place]   : cabinet override      02/24/18 1518 02/25/18 0329   02/23/18 1300  ceFAZolin (ANCEF) IVPB 2g/100 mL premix     2 g 200 mL/hr over 30 Minutes Intravenous  Once 02/23/18 1157 02/24/18 0038        Objective:   Vitals:   02/28/18 0130 02/28/18 0155 02/28/18 0202 02/28/18 0235  BP: (!) 94/56 (!) 97/57 102/60 123/81  Pulse: 78 80 83 82  Resp: (!) 22 (!) 27 (!) 21 18  Temp:   98.5 F (36.9 C) 98.4 F (36.9 C)  TempSrc:   Oral Oral  SpO2: 99% 99% 98% 100%  Weight:   69.9 kg 71.8 kg  Height:        Wt Readings from Last 3 Encounters:  02/28/18 71.8 kg  02/22/18 73 kg  02/21/18 72.1 kg    Intake/Output Summary (Last 24 hours) at 02/28/2018 1526 Last data filed at 02/28/2018 7121 Gross per 24 hour  Intake 240 ml  Output -30 ml  Net 270 ml    Physical Exam Patient is examined daily including today on 02/28/18 , exams remain the same as of yesterday except that has changed   Gen:- Awake Alert,  no apparent distress HEENT:- [place].AT, No sclera icterus Neck-Supple Neck,No JVD,. Rt IJ HD catheter site is clean dry and intact Lungs-  CTAB , fair symmetrical air movement CV- S1, S2 normal, regular  Abd-  +ve B.Sounds, Abd Soft, No tenderness,    Extremity/Skin:- No  edema, pedal pulses present Psych-affect is appropriate, oriented x3 Neuro-no new focal deficits, no tremors   Data Review:   Micro Results Recent Results (from the past 240 hour(s))  Surgical pcr screen     Status: None   Collection Time: 02/23/18 10:09 PM  Result Value Ref Range Status   MRSA, PCR  NEGATIVE NEGATIVE Final   Staphylococcus aureus NEGATIVE NEGATIVE Final    Comment: (NOTE) The Xpert SA Assay (FDA approved for NASAL specimens in patients 11 years of age and older), is one component of a comprehensive surveillance program. It is not intended to diagnose infection nor to guide or monitor treatment. Performed at [facility], [place] [address] [place], [place] [address]     Radiology Reports US Renal  Result Date: 02/22/2018 CLINICAL DATA:  Acute renal failure EXAM: RENAL / URINARY TRACT ULTRASOUND COMPLETE COMPARISON:  None. FINDINGS: Right Kidney: Renal measurements: 7.35 x 3.06 x 2.94 cm = volume: 34.62 mL. There is a tiny amount of perinephric fluid adjacent to the superior pole. Diffuse increased cortical echogenicity. Left Kidney: Renal measurements: 8.68 x 3.32  x 3.26 cm = volume: 49.19 mL. Minimal fullness of the collecting system. No gross hydronephrosis. Bladder: Appears normal for degree of bladder distention. IMPRESSION: 1. A small echogenic kidneys consistent with medical renal disease. 2. A tiny amount of fluid adjacent to the superior pole the right kidney is age indeterminate nonspecific. 3. Minimal fullness in the left collecting system of doubtful acute significance. Electronically Signed   By: Dorise Bullion III M.D   On: 02/22/2018 11:57   Ir Fluoro Guide Cv Line Right  Result Date: 02/24/2018 INDICATION: 43 year old female with a history of renal failure EXAM: TUNNELED CENTRAL VENOUS HEMODIALYSIS CATHETER PLACEMENT WITH ULTRASOUND AND FLUOROSCOPIC GUIDANCE MEDICATIONS: 2.0 g Ancef. The antibiotic was given in an appropriate time interval prior to skin puncture. ANESTHESIA/SEDATION: Moderate (conscious) sedation was employed during this procedure. A total of Versed 1.0 mg and Fentanyl 75 mcg was administered intravenously. Moderate Sedation Time: 17 minutes. The patient's level of consciousness and vital signs were monitored continuously by radiology nursing  throughout the procedure under my direct supervision. FLUOROSCOPY TIME:  Fluoroscopy Time: 0 minutes 12 seconds (0.5 mGy). COMPLICATIONS: None PROCEDURE: Informed written consent was obtained from the patient after a discussion of the risks, benefits, and alternatives to treatment. Questions regarding the procedure were encouraged and answered. The right neck and chest were prepped with chlorhexidine in a sterile fashion, and a sterile drape was applied covering the operative field. Maximum barrier sterile technique with sterile gowns and gloves were used for the procedure. A timeout was performed prior to the initiation of the procedure. After creating a small venotomy incision, a micropuncture kit was utilized to access the right internal jugular vein under direct, real-time ultrasound guidance after the overlying soft tissues were anesthetized with 1% lidocaine with epinephrine. Ultrasound image documentation was performed. The microwire was marked to measure appropriate internal catheter length. External tunneled length was estimated. A total tip to cuff length of 19 cm was selected. Skin and subcutaneous tissues of chest wall below the clavicle were generously infiltrated with 1% lidocaine for local anesthesia. A small stab incision was made with 11 blade scalpel. The selected hemodialysis catheter was tunneled in a retrograde fashion from the anterior chest wall to the venotomy incision. A guidewire was advanced to the level of the IVC and the micropuncture sheath was exchanged for a peel-away sheath. The catheter was then placed through the peel-away sheath with tips ultimately positioned within the superior aspect of the right atrium. Final catheter positioning was confirmed and documented with a spot radiographic image. The catheter aspirates and flushes normally. The catheter was flushed with appropriate volume heparin dwells. The catheter exit site was secured with a 0-Prolene retention suture. The  venotomy incision was closed Derma bond and sterile dressing. Dressings were applied at the chest wall. Patient tolerated the procedure well and remained hemodynamically stable throughout. No complications were encountered and no significant blood loss encountered. IMPRESSION: Status post right IJ tunneled hemodialysis catheter placement. Catheter ready for use. Signed, Dulcy Fanny. Dellia Nims, RPVI [facility] Electronically Signed   By: Corrie Mckusick D.O.   On: 02/24/2018 16:28   Ir US Guide Vasc Access Right  Result Date: 02/24/2018 INDICATION: 43 year old female with a history of renal failure EXAM: TUNNELED CENTRAL VENOUS HEMODIALYSIS CATHETER PLACEMENT WITH ULTRASOUND AND FLUOROSCOPIC GUIDANCE MEDICATIONS: 2.0 g Ancef. The antibiotic was given in an appropriate time interval prior to skin puncture. ANESTHESIA/SEDATION: Moderate (conscious) sedation was employed during this procedure. A total  of Versed 1.0 mg and Fentanyl 75 mcg was administered intravenously. Moderate Sedation Time: 17 minutes. The patient's level of consciousness and vital signs were monitored continuously by radiology nursing throughout the procedure under my direct supervision. FLUOROSCOPY TIME:  Fluoroscopy Time: 0 minutes 12 seconds (0.5 mGy). COMPLICATIONS: None PROCEDURE: Informed written consent was obtained from the patient after a discussion of the risks, benefits, and alternatives to treatment. Questions regarding the procedure were encouraged and answered. The right neck and chest were prepped with chlorhexidine in a sterile fashion, and a sterile drape was applied covering the operative field. Maximum barrier sterile technique with sterile gowns and gloves were used for the procedure. A timeout was performed prior to the initiation of the procedure. After creating a small venotomy incision, a micropuncture kit was utilized to access the right internal jugular vein under  direct, real-time ultrasound guidance after the overlying soft tissues were anesthetized with 1% lidocaine with epinephrine. Ultrasound image documentation was performed. The microwire was marked to measure appropriate internal catheter length. External tunneled length was estimated. A total tip to cuff length of 19 cm was selected. Skin and subcutaneous tissues of chest wall below the clavicle were generously infiltrated with 1% lidocaine for local anesthesia. A small stab incision was made with 11 blade scalpel. The selected hemodialysis catheter was tunneled in a retrograde fashion from the anterior chest wall to the venotomy incision. A guidewire was advanced to the level of the IVC and the micropuncture sheath was exchanged for a peel-away sheath. The catheter was then placed through the peel-away sheath with tips ultimately positioned within the superior aspect of the right atrium. Final catheter positioning was confirmed and documented with a spot radiographic image. The catheter aspirates and flushes normally. The catheter was flushed with appropriate volume heparin dwells. The catheter exit site was secured with a 0-Prolene retention suture. The venotomy incision was closed Derma bond and sterile dressing. Dressings were applied at the chest wall. Patient tolerated the procedure well and remained hemodynamically stable throughout. No complications were encountered and no significant blood loss encountered. IMPRESSION: Status post right IJ tunneled hemodialysis catheter placement. Catheter ready for use. Signed, Dulcy Fanny. Dellia Nims, RPVI [facility] Electronically Signed   By: Corrie Mckusick D.O.   On: 02/24/2018 16:28   Vas [place] Lower Extremity Venous (dvt)  Result Date: 02/23/2018  Lower Venous Study Indications: Swelling.  Performing Technologist: Oliver Hum RVT  Examination Guidelines: A complete evaluation includes B-mode imaging, spectral  Doppler, color Doppler, and power Doppler as needed of all accessible portions of each vessel. Bilateral testing is considered an integral part of a complete examination. Limited examinations for reoccurring indications may be performed as noted.  Right Venous Findings: +---------+---------------+---------+-----------+----------+-------+          CompressibilityPhasicitySpontaneityPropertiesSummary +---------+---------------+---------+-----------+----------+-------+ CFV      Full           Yes      Yes                          +---------+---------------+---------+-----------+----------+-------+ SFJ      Full                                                 +---------+---------------+---------+-----------+----------+-------+ FV Prox  Full                                                 +---------+---------------+---------+-----------+----------+-------+  FV Mid   Full                                                 +---------+---------------+---------+-----------+----------+-------+ FV DistalFull                                                 +---------+---------------+---------+-----------+----------+-------+ PFV      Full                                                 +---------+---------------+---------+-----------+----------+-------+ POP      Full           Yes      Yes                          +---------+---------------+---------+-----------+----------+-------+ PTV      Full                                                 +---------+---------------+---------+-----------+----------+-------+ PERO     Full                                                 +---------+---------------+---------+-----------+----------+-------+  Left Venous Findings: +---------+---------------+---------+-----------+----------+-------+          CompressibilityPhasicitySpontaneityPropertiesSummary +---------+---------------+---------+-----------+----------+-------+  CFV      Full           Yes      Yes                          +---------+---------------+---------+-----------+----------+-------+ SFJ      Full                                                 +---------+---------------+---------+-----------+----------+-------+ FV Prox  Full                                                 +---------+---------------+---------+-----------+----------+-------+ FV Mid   Full                                                 +---------+---------------+---------+-----------+----------+-------+ FV DistalFull                                                 +---------+---------------+---------+-----------+----------+-------+  PFV      Full                                                 +---------+---------------+---------+-----------+----------+-------+ POP      Full           Yes      Yes                          +---------+---------------+---------+-----------+----------+-------+ PTV      Full                                                 +---------+---------------+---------+-----------+----------+-------+ PERO     Full                                                 +---------+---------------+---------+-----------+----------+-------+    Summary: Right: There is no evidence of deep vein thrombosis in the lower extremity. No cystic structure found in the popliteal fossa. Left: There is no evidence of deep vein thrombosis in the lower extremity. No cystic structure found in the popliteal fossa.  *See table(s) above for measurements and observations. Electronically signed by Servando Snare MD on 02/23/2018 at 1:43:48 PM.    Final    Vas [place] Upper Ext Vein Mapping (pre-op Avf)  Result Date: 02/27/2018 UPPER EXTREMITY VEIN MAPPING  Indications: Pre-op dialysis access. Performing Technologist: Sharion Dove RVS  Examination Guidelines: A complete evaluation includes B-mode imaging, spectral Doppler, color Doppler, and power Doppler as  needed of all accessible portions of each vessel. Bilateral testing is considered an integral part of a complete examination. Limited examinations for reoccurring indications may be performed as noted. +-----------------+-------------+----------+--------------------------------+ Right Cephalic   Diameter (cm)Depth (cm)            Findings             +-----------------+-------------+----------+--------------------------------+ Prox upper arm       0.43        0.69                                    +-----------------+-------------+----------+--------------------------------+ Mid upper arm        0.37        0.57              branching             +-----------------+-------------+----------+--------------------------------+ Dist upper arm       0.43        0.32                                    +-----------------+-------------+----------+--------------------------------+ Antecubital fossa    0.50        0.31              branching             +-----------------+-------------+----------+--------------------------------+ Prox forearm  0.41        0.57              branching             +-----------------+-------------+----------+--------------------------------+ Mid forearm          0.42        0.65                                    +-----------------+-------------+----------+--------------------------------+ Wrist                0.36        0.50   branching and thrombus in branch +-----------------+-------------+----------+--------------------------------+ +-----------------+-------------+----------+---------+ Left Cephalic    Diameter (cm)Depth (cm)Findings  +-----------------+-------------+----------+---------+ Prox upper arm       0.52        0.88             +-----------------+-------------+----------+---------+ Mid upper arm        0.50        0.79             +-----------------+-------------+----------+---------+ Dist upper arm       0.29         0.35   branching +-----------------+-------------+----------+---------+ Antecubital fossa    0.62        0.34             +-----------------+-------------+----------+---------+ Prox forearm         0.50        0.79   branching +-----------------+-------------+----------+---------+ Mid forearm          0.40        0.63             +-----------------+-------------+----------+---------+ Wrist                0.41        0.86   branching +-----------------+-------------+----------+---------+ *See table(s) above for measurements and observations.  Diagnosing physician:    Preliminary      CBC Recent Labs  Lab 02/21/18 1544 02/22/18 1006 02/22/18 2023 02/23/18 0539 02/24/18 0451 02/26/18 0450 02/27/18 2222  WBC 8.6 7.9  --  7.1 7.3 8.2 10.3  HGB 5.7* 5.7* 7.6* 7.9* 8.0* 8.9* 8.2*  HCT 17.3* 18.7* 24.3* 25.2* 25.6* 28.1* 25.6*  PLT 396 337  --  306 285 266 234  MCV 80.5 88.6  --  88.4 85.6 84.9 86.5  MCH 26.5* 27.0  --  27.7 26.8 26.9 27.7  MCHC 32.9 30.5  --  31.3 31.3 31.7 32.0  RDW 15.9* 16.0*  --  15.8* 15.2 15.2 14.9  LYMPHSABS 1,273 1.2  --   --   --   --   --   MONOABS  --  0.3  --   --   --   --   --   EOSABS 301 0.3  --   --   --   --   --   BASOSABS 26 0.0  --   --   --   --   --     Chemistries  Recent Labs  Lab 02/21/18 1541 02/22/18 1006 02/22/18 1423 02/23/18 0539 02/24/18 0451 02/26/18 0450 02/27/18 2222  NA 140 139  --  144 140 140 136  K 4.6 4.1  --  4.4 4.2 3.9 3.4*  CL 112* 114*  --  115* 113* 107 102  CO2 12* 13*  --  11* 14* 17*  22  GLUCOSE 77 92  --  89 89 92 124*  BUN 85* 92*  --  88* 91* 66* 53*  CREATININE 10.65* 10.76*  --  10.57* 10.60* 8.92* 7.73*  CALCIUM 8.9 8.9 8.9 8.8* 8.6* 9.6 9.1  AST 6* 7*  --   --   --   --   --   ALT 8 11  --   --   --   --   --   ALKPHOS  --  72  --   --   --   --   --   BILITOT 0.3 0.2*  --   --   --   --   --     ------------------------------------------------------------------------------------------------------------------ No results for input(s): CHOL, HDL, LDLCALC, TRIG, CHOLHDL, LDLDIRECT in the last 72 hours.  Lab Results  Component Value Date   HGBA1C 5.7 (H) 02/24/2018   Coagulation profile Recent Labs  Lab 02/23/18 1232  INR 1.11    Roxan Hockey M.D on 02/28/2018 at 3:26 PM  Go to [email or web address] - for contact info  [facility] - Office  [phone]

## 2018-03-01 ENCOUNTER — Inpatient Hospital Stay (HOSPITAL_COMMUNITY): Payer: Medicaid Other

## 2018-03-01 LAB — HEPATITIS B CORE ANTIBODY, TOTAL: Hep B Core Total Ab: NEGATIVE

## 2018-03-01 LAB — CBC WITH DIFFERENTIAL/PLATELET
Abs Immature Granulocytes: 0.08 10*3/uL — ABNORMAL HIGH (ref 0.00–0.07)
Basophils Absolute: 0 10*3/uL (ref 0.0–0.1)
Basophils Relative: 0 %
Eosinophils Absolute: 0.2 10*3/uL (ref 0.0–0.5)
Eosinophils Relative: 2 %
HCT: 26.6 % — ABNORMAL LOW (ref 36.0–46.0)
Hemoglobin: 8.5 g/dL — ABNORMAL LOW (ref 12.0–15.0)
Immature Granulocytes: 1 %
Lymphocytes Relative: 10 %
Lymphs Abs: 1.2 10*3/uL (ref 0.7–4.0)
MCH: 27.9 pg (ref 26.0–34.0)
MCHC: 32 g/dL (ref 30.0–36.0)
MCV: 87.2 fL (ref 80.0–100.0)
MONO ABS: 0.5 10*3/uL (ref 0.1–1.0)
MONOS PCT: 4 %
Neutro Abs: 10.4 10*3/uL — ABNORMAL HIGH (ref 1.7–7.7)
Neutrophils Relative %: 83 %
Platelets: 208 10*3/uL (ref 150–400)
RBC: 3.05 MIL/uL — ABNORMAL LOW (ref 3.87–5.11)
RDW: 15.4 % (ref 11.5–15.5)
WBC: 12.4 10*3/uL — ABNORMAL HIGH (ref 4.0–10.5)
nRBC: 0 % (ref 0.0–0.2)

## 2018-03-01 LAB — CBC
HCT: 26.6 % — ABNORMAL LOW (ref 36.0–46.0)
Hemoglobin: 8.2 g/dL — ABNORMAL LOW (ref 12.0–15.0)
MCH: 27.1 pg (ref 26.0–34.0)
MCHC: 30.8 g/dL (ref 30.0–36.0)
MCV: 87.8 fL (ref 80.0–100.0)
Platelets: 206 10*3/uL (ref 150–400)
RBC: 3.03 MIL/uL — ABNORMAL LOW (ref 3.87–5.11)
RDW: 15.1 % (ref 11.5–15.5)
WBC: 8.6 10*3/uL (ref 4.0–10.5)
nRBC: 0 % (ref 0.0–0.2)

## 2018-03-01 LAB — COMPREHENSIVE METABOLIC PANEL
ALT: 10 U/L (ref 0–44)
ANION GAP: 12 (ref 5–15)
AST: 27 U/L (ref 15–41)
Albumin: 3.4 g/dL — ABNORMAL LOW (ref 3.5–5.0)
Alkaline Phosphatase: 59 U/L (ref 38–126)
BUN: 11 mg/dL (ref 6–20)
CO2: 28 mmol/L (ref 22–32)
Calcium: 9.3 mg/dL (ref 8.9–10.3)
Chloride: 98 mmol/L (ref 98–111)
Creatinine, Ser: 3.62 mg/dL — ABNORMAL HIGH (ref 0.44–1.00)
GFR calc non Af Amer: 14 mL/min — ABNORMAL LOW (ref >60)
GFR, EST AFRICAN AMERICAN: 17 mL/min — AB (ref >60)
Glucose, Bld: 186 mg/dL — ABNORMAL HIGH (ref 70–99)
Potassium: 3.7 mmol/L (ref 3.5–5.1)
Sodium: 138 mmol/L (ref 135–145)
TOTAL PROTEIN: 7 g/dL (ref 6.5–8.1)
Total Bilirubin: 0.6 mg/dL (ref 0.3–1.2)

## 2018-03-01 LAB — BASIC METABOLIC PANEL
Anion gap: 13 (ref 5–15)
BUN: 37 mg/dL — AB (ref 6–20)
CO2: 22 mmol/L (ref 22–32)
Calcium: 10.1 mg/dL (ref 8.9–10.3)
Chloride: 102 mmol/L (ref 98–111)
Creatinine, Ser: 6.32 mg/dL — ABNORMAL HIGH (ref 0.44–1.00)
GFR calc Af Amer: 9 mL/min — ABNORMAL LOW (ref >60)
GFR calc non Af Amer: 7 mL/min — ABNORMAL LOW (ref >60)
Glucose, Bld: 94 mg/dL (ref 70–99)
Potassium: 4.4 mmol/L (ref 3.5–5.1)
Sodium: 137 mmol/L (ref 135–145)

## 2018-03-01 LAB — LIPASE, BLOOD: Lipase: 63 U/L — ABNORMAL HIGH (ref 11–51)

## 2018-03-01 MED ORDER — CEFAZOLIN SODIUM-DEXTROSE 1-4 GM/50ML-% IV SOLN
1.0000 g | INTRAVENOUS | Status: AC
  Administered 2018-03-02: 2 g via INTRAVENOUS

## 2018-03-01 MED ORDER — HEPARIN SODIUM (PORCINE) 1000 UNIT/ML IJ SOLN
INTRAMUSCULAR | Status: AC
  Administered 2018-03-01: 3200 [IU] via INTRAVENOUS_CENTRAL
  Filled 2018-03-01: qty 4

## 2018-03-01 MED ORDER — PANTOPRAZOLE SODIUM 40 MG PO TBEC
40.0000 mg | DELAYED_RELEASE_TABLET | Freq: Two times a day (BID) | ORAL | Status: DC
  Administered 2018-03-01 – 2018-03-03 (×4): 40 mg via ORAL
  Filled 2018-03-01 (×4): qty 1

## 2018-03-01 MED ORDER — SODIUM CHLORIDE 0.9 % IV SOLN
125.0000 mg | INTRAVENOUS | Status: DC
  Administered 2018-03-01 – 2018-03-03 (×2): 125 mg via INTRAVENOUS
  Filled 2018-03-01 (×3): qty 10

## 2018-03-01 MED ORDER — DARBEPOETIN ALFA 100 MCG/0.5ML IJ SOSY
100.0000 ug | PREFILLED_SYRINGE | INTRAMUSCULAR | Status: DC
  Administered 2018-03-02: 100 ug via SUBCUTANEOUS
  Filled 2018-03-01: qty 0.5

## 2018-03-01 NOTE — Progress Notes (Signed)
[facility] NEPHROLOGY PROGRESS NOTE  Assessment/ Plan: Pt is a 43 y.o. yo female with past medical history of hypertension for about 3 years, supposed to be on HCTZ but not taking, menorrhagia, recently diagnosed hypertensive retinopathy by her ophthalmologist, referred by PCP to the ER for the management of severe anemia and elevated serum creatinine level found on routine lab.   #AKI versus progressive CKD:  Suspected CKD progressed to ESRD now, possibly due to HTN (though BP ok off meds now) versus undiagnosed glomerular (proteinuria).  Ultrasound kidneys with bilateral atrophic kidney and increased cortical echogenicity. She does have hypertensive retinopathy. Urinalysis with no RBC, no WBC, PCR 3.2.  Patient is nonoliguric. -complements, HepBsAg, HIV, SPEP negative. -Follow up hepatitis C antibody,  ANA, ANCA -PTH elevated and atrophic kidneys I doubt there is any significant renal recovery.  Patient  has nausea, insomnia, dysgeusia and generalized weakness symptoms consistent of uremia.  I do not think biopsy will be helpful given atrophic kidneys.  Patient received first dialysis on 2/7, 3rd 2/10.  Plan for third treatment today.  No ultrafiltration.  -Vein mapping complete - Appreciate VVS consult re perm access.  Plan Thurs AVF. - Dialyzing on MWF schedule now - HD today -CLIP underway  #Anemia due to acute blood loss and CKD: Received 2 units of red blood cell transfusion.  Given history of menorrhagia I recommend GYN consult/pelvic ultrasound.   -IV iron and Aranesp. Was not receiving iron so ordered 2/12 and increased aranesp dose due to downtrending Hb in the 8s.   #Secondary hyperparathyroidism: PTH 540, high phosphorus.  Started Lorin Picket.  Phos improved to 4.8. Calcitriol.  #History of hypertension: Monitor blood pressure.  Subjective: Seen and examined.  Overall feeling quite well.  C/o crampy abd pain which she thinks may be menstrual related.  Having BMs per her  norm.   Objective Vital signs in last 24 hours: Vitals:   02/28/18 0202 02/28/18 0235 02/28/18 1955 03/01/18 0412  BP: 102/60 123/81 129/86 127/65  Pulse: 83 82 89 82  Resp: (!) 21 18 18 18   Temp: 98.5 F (36.9 C) 98.4 F (36.9 C) 98.4 F (36.9 C) 98.6 F (37 C)  TempSrc: Oral Oral Oral Oral  SpO2: 98% 100% 100% 97%  Weight: 69.9 kg 71.8 kg  70.9 kg  Height:       Weight change: 0.952 kg  Intake/Output Summary (Last 24 hours) at 03/01/2018 0823 Last data filed at 03/01/2018 0519 Gross per 24 hour  Intake 480 ml  Output 602 ml  Net -122 ml       Labs: Basic Metabolic Panel: Recent Labs  Lab 02/24/18 0451 02/26/18 0450 02/27/18 2222 03/01/18 0408  NA 140 140 136 137  K 4.2 3.9 3.4* 4.4  CL 113* 107 102 102  CO2 14* 17* 22 22  GLUCOSE 89 92 124* 94  BUN 91* 66* 53* 37*  CREATININE 10.60* 8.92* 7.73* 6.32*  CALCIUM 8.6* 9.6 9.1 10.1  PHOS 7.9* 6.8* 4.8*  --    Liver Function Tests: Recent Labs  Lab 02/22/18 1006 02/24/18 0451 02/26/18 0450 02/27/18 2222  AST 7*  --   --   --   ALT 11  --   --   --   ALKPHOS 72  --   --   --   BILITOT 0.2*  --   --   --   PROT 8.2*  --   --   --   ALBUMIN 3.8 3.1* 3.1* 3.0*  No results for input(s): LIPASE, AMYLASE in the last 168 hours. No results for input(s): AMMONIA in the last 168 hours. CBC: Recent Labs  Lab 02/22/18 1006  02/23/18 0539 02/24/18 0451 02/26/18 0450 02/27/18 2222 03/01/18 0408  WBC 7.9  --  7.1 7.3 8.2 10.3 8.6  NEUTROABS 6.2  --   --   --   --   --   --   HGB 5.7*   < > 7.9* 8.0* 8.9* 8.2* 8.2*  HCT 18.7*   < > 25.2* 25.6* 28.1* 25.6* 26.6*  MCV 88.6  --  88.4 85.6 84.9 86.5 87.8  PLT 337  --  306 285 266 234 206   < > = values in this interval not displayed.   Cardiac Enzymes: No results for input(s): CKTOTAL, CKMB, CKMBINDEX, TROPONINI in the last 168 hours. CBG: No results for input(s): GLUCAP in the last 168 hours.  Iron Studies:  No results for input(s): IRON, TIBC,  TRANSFERRIN, FERRITIN in the last 72 hours. Studies/Results: Vas [place] Upper Ext Vein Mapping (pre-op Avf)  Result Date: 02/27/2018 UPPER EXTREMITY VEIN MAPPING  Indications: Pre-op dialysis access. Performing Technologist: Sharion Dove RVS  Examination Guidelines: A complete evaluation includes B-mode imaging, spectral Doppler, color Doppler, and power Doppler as needed of all accessible portions of each vessel. Bilateral testing is considered an integral part of a complete examination. Limited examinations for reoccurring indications may be performed as noted. +-----------------+-------------+----------+--------------------------------+ Right Cephalic   Diameter (cm)Depth (cm)            Findings             +-----------------+-------------+----------+--------------------------------+ Prox upper arm       0.43        0.69                                    +-----------------+-------------+----------+--------------------------------+ Mid upper arm        0.37        0.57              branching             +-----------------+-------------+----------+--------------------------------+ Dist upper arm       0.43        0.32                                    +-----------------+-------------+----------+--------------------------------+ Antecubital fossa    0.50        0.31              branching             +-----------------+-------------+----------+--------------------------------+ Prox forearm         0.41        0.57              branching             +-----------------+-------------+----------+--------------------------------+ Mid forearm          0.42        0.65                                    +-----------------+-------------+----------+--------------------------------+ Wrist                0.36  0.50   branching and thrombus in branch +-----------------+-------------+----------+--------------------------------+  +-----------------+-------------+----------+---------+ Left Cephalic    Diameter (cm)Depth (cm)Findings  +-----------------+-------------+----------+---------+ Prox upper arm       0.52        0.88             +-----------------+-------------+----------+---------+ Mid upper arm        0.50        0.79             +-----------------+-------------+----------+---------+ Dist upper arm       0.29        0.35   branching +-----------------+-------------+----------+---------+ Antecubital fossa    0.62        0.34             +-----------------+-------------+----------+---------+ Prox forearm         0.50        0.79   branching +-----------------+-------------+----------+---------+ Mid forearm          0.40        0.63             +-----------------+-------------+----------+---------+ Wrist                0.41        0.86   branching +-----------------+-------------+----------+---------+ *See table(s) above for measurements and observations.  Diagnosing physician:    Preliminary     Medications: Infusions: . sodium chloride    . sodium chloride      Scheduled Medications: . calcitRIOL  0.25 mcg Oral Daily  . Chlorhexidine Gluconate Cloth  6 each Topical Q0600  . darbepoetin (ARANESP) injection - NON-DIALYSIS  60 mcg Subcutaneous Q Thu-1800  . ferric citrate  630 mg Oral TID WC  . Vitamin D (Ergocalciferol)  50,000 Units Oral Q7 days    have reviewed scheduled and prn medications.  Physical Exam: General: Not in distress, comfortable Heart:RRR, s1s2 nl no rubs Lungs: Clear bilateral, no wheezing or crackles Abdomen:soft, Non-tender, non-distended Extremities: No edema Neurology: Alert, awake Skin: No rashes Psych: pleasant affect  Justin Mend 03/01/2018,8:23 AM  LOS: 7 days

## 2018-03-01 NOTE — Progress Notes (Signed)
[facility] Progress Note  Patient: Sara Meza YBO:[id number]   PCP: [name], PA-C DOB: 1975-08-16   DOA: 02/22/2018   DOS: 03/01/2018   Date of Service: the patient was seen and examined on 03/01/2018  Brief hospital course: 43 year old with past medical history relevant for hypertension hypertensive retinopathy admitted on 02/22/2018 with worsening renal function, and non-anion gap metabolic acidosis-likely will be requiring hemodialysis, Bil LE Dopplers negative for DVT, patient was started on HD on 02/24/2018, had bilateral upper extremity vein mapping on 02/27/2018, awaiting permanent hemodialysis access placement on 03/02/18, CLIP underway  Subjective: Reported 4 loose black-colored bowel movement yesterday.  Also report abdominal cramp.  No nausea no vomiting.  Reports acid reflux.  No diarrhea this morning.  Underwent hemodialysis and tolerated well.  Assessment and Plan: 1. 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 Nephrology input appreciated, was initiated on HD on 02/24/18 had bilateral upper extremity vein mapping on 02/27/2018, awaiting permanent hemodialysis access placement by vascular surgery on 03/02/18,  treated with  bicarb for non-anion gap metabolic acidosis renal ultrasound with bil atrophic kidneys,  Calcitriol  And Auryxia,  protein electrophoresis is unremarkable, hepatitis B is negative,  hepatitis C is pending, complement testing reveals no complement deficiency, ANA and ANCA titers are pending Per nephrologist kidney biopsy may not change management or outcome UA with glucosuria, hematuria and proteinuria  2. HTN history of hypertensive retinopathy,  stable at this time,  PTA was noncompliant with antihypertensives,  nephrologist advises holding off on anti-hypertensives at this time  3.  Acute on chronic anemia of CKD patient with iron deficiency anemia secondary to menorrhagia and also anemia of chronic kidney disease. Hemoglobin is up to 8.1   from 5.7 after transfusion of 2 units on 02/22/18  Aranesp and iron infusion by nephrologist  4. Secondary Hyperparathyroidism patient with PTH of 540 and phosphorous was 9.4, trending down, c/n calcitriol and  c/n Auryxia per nephrologist  5. Vitamin D deficiency vitamin D is low at 12, replace with 50,000 units weekly  6.  Abdominal pain with cramping and diarrhea. We will get x-ray abdomen. Check repeat CBC and CMP. Also check lipase level. Check x-ray abdomen.  Diet: renal diet DVT Prophylaxis: subcutaneous Heparin  Advance goals of care discussion: full code  Family Communication: family was present at bedside, at the time of interview. The pt provided permission to discuss medical plan with the family. Opportunity was given to ask question and all questions were answered satisfactorily.   Disposition:  Discharge to home.  Consultants: nephrology  Vascular surgery   Procedures: HD TDC  Scheduled Meds: . calcitRIOL  0.25 mcg Oral Daily  . Chlorhexidine Gluconate Cloth  6 each Topical Q0600  . [START ON 03/02/2018] darbepoetin (ARANESP) injection - NON-DIALYSIS  100 mcg Subcutaneous Q Thu-1800  . ferric citrate  630 mg Oral TID WC  . pantoprazole  40 mg Oral BID AC  . Vitamin D (Ergocalciferol)  50,000 Units Oral Q7 days   Continuous Infusions: . [START ON 03/02/2018]  ceFAZolin (ANCEF) IV    . ferric gluconate (FERRLECIT/NULECIT) IV 125 mg (03/01/18 1438)   PRN Meds: acetaminophen, HYDROcodone-acetaminophen Antibiotics: Anti-infectives (From admission, onward)   Start     Dose/Rate Route Frequency Ordered Stop   03/02/18 0800  ceFAZolin (ANCEF) IVPB 1 g/50 mL premix    Note to Pharmacy:  Send with pt to OR   1 g 100 mL/hr over 30 Minutes Intravenous To [facility] 03/01/18 0828 03/03/18 0800  02/24/18 1534  ceFAZolin (ANCEF) powder       As needed 02/24/18 1534 02/24/18 1534   02/24/18 1518  ceFAZolin (ANCEF) 2-4 GM/100ML-% IVPB    Note to Pharmacy:   [place]   : cabinet override      02/24/18 1518 02/25/18 0329   02/23/18 1300  ceFAZolin (ANCEF) IVPB 2g/100 mL premix     2 g 200 mL/hr over 30 Minutes Intravenous  Once 02/23/18 1157 02/24/18 0038       Objective: Physical Exam: Vitals:   03/01/18 1500 03/01/18 1515 03/01/18 1528 03/01/18 1555  BP: 109/64 120/70 116/67 125/80  Pulse: 81 84 85 89  Resp:   (!) 22 20  Temp:   98.2 F (36.8 C) 98.7 F (37.1 C)  TempSrc:   Oral Oral  SpO2:   97% 100%  Weight:   70.7 kg   Height:        Intake/Output Summary (Last 24 hours) at 03/01/2018 1900 Last data filed at 03/01/2018 1700 Gross per 24 hour  Intake 480 ml  Output 602 ml  Net -122 ml   Filed Weights   03/01/18 0412 03/01/18 1150 03/01/18 1528  Weight: 70.9 kg 70.9 kg 70.7 kg   General: Alert, Awake and Oriented to Time, Place and Person. Appear in mild distress, affect appropriate Eyes: PERRL, Conjunctiva normal ENT: Oral Mucosa clear moist. Neck: no JVD, no Abnormal Mass Or lumps Cardiovascular: S1 and S2 Present, no Murmur, Peripheral Pulses Present Respiratory: normal respiratory effort, Bilateral Air entry equal and Decreased, no use of accessory muscle, Clear to Auscultation, no Crackles, no wheezes Abdomen: Bowel Sound present, Soft and mild right tenderness, no hernia Skin: no redness, no Rash, no induration Extremities: no Pedal edema, no calf tenderness Neurologic: Grossly no focal neuro deficit. Bilaterally Equal motor strength  Data Reviewed: CBC: Recent Labs  Lab 02/23/18 0539 02/24/18 0451 02/26/18 0450 02/27/18 2222 03/01/18 0408  WBC 7.1 7.3 8.2 10.3 8.6  HGB 7.9* 8.0* 8.9* 8.2* 8.2*  HCT 25.2* 25.6* 28.1* 25.6* 26.6*  MCV 88.4 85.6 84.9 86.5 87.8  PLT 306 285 266 234 671   Basic Metabolic Panel: Recent Labs  Lab 02/23/18 0539 02/24/18 0451 02/26/18 0450 02/27/18 2222 03/01/18 0408  NA 144 140 140 136 137  K 4.4 4.2 3.9 3.4* 4.4  CL 115* 113* 107 102 102  CO2 11* 14* 17* 22 22   GLUCOSE 89 89 92 124* 94  BUN 88* 91* 66* 53* 37*  CREATININE 10.57* 10.60* 8.92* 7.73* 6.32*  CALCIUM 8.8* 8.6* 9.6 9.1 10.1  PHOS 9.4* 7.9* 6.8* 4.8*  --     Liver Function Tests: Recent Labs  Lab 02/24/18 0451 02/26/18 0450 02/27/18 2222  ALBUMIN 3.1* 3.1* 3.0*   No results for input(s): LIPASE, AMYLASE in the last 168 hours. No results for input(s): AMMONIA in the last 168 hours. Coagulation Profile: Recent Labs  Lab 02/23/18 1232  INR 1.11   Cardiac Enzymes: No results for input(s): CKTOTAL, CKMB, CKMBINDEX, TROPONINI in the last 168 hours. BNP (last 3 results) No results for input(s): PROBNP in the last 8760 hours. CBG: No results for input(s): GLUCAP in the last 168 hours. Studies: No results found.   Time spent: 35 minutes  Author: Berle Mull, MD [facility] 03/01/2018 7:00 PM  To reach On-call, see care teams to locate the attending and reach out to them via [email or web address]. If 7PM-7AM, please contact night-coverage If you still have difficulty reaching the attending provider, please  page the [facility] (Director on Call) for [facility] on amion for assistance.

## 2018-03-01 NOTE — Progress Notes (Signed)
VASCULAR SURGERY:   The patient is scheduled for a right arteriovenous fistula tomorrow.  Please do not do her hemodialysis tomorrow as she is scheduled for Thursday.  Deitra Mayo, MD, [place] [phone] Office: [phone]

## 2018-03-02 ENCOUNTER — Inpatient Hospital Stay (HOSPITAL_COMMUNITY): Payer: Medicaid Other | Admitting: Certified Registered"

## 2018-03-02 ENCOUNTER — Telehealth: Payer: Self-pay | Admitting: Vascular Surgery

## 2018-03-02 ENCOUNTER — Encounter (HOSPITAL_COMMUNITY)
Admission: EM | Disposition: A | Discharge status: Home / Self Care | Payer: Self-pay | Source: Home / Self Care | Attending: Family Medicine

## 2018-03-02 ENCOUNTER — Encounter (HOSPITAL_COMMUNITY): Payer: Self-pay | Admitting: Certified Registered"

## 2018-03-02 HISTORY — PX: AV FISTULA PLACEMENT: SHX1204

## 2018-03-02 LAB — CBC WITH DIFFERENTIAL/PLATELET
Abs Immature Granulocytes: 0.08 10*3/uL — ABNORMAL HIGH (ref 0.00–0.07)
Basophils Absolute: 0.1 10*3/uL (ref 0.0–0.1)
Basophils Relative: 0 %
Eosinophils Absolute: 0.5 10*3/uL (ref 0.0–0.5)
Eosinophils Relative: 3 %
HEMATOCRIT: 30.8 % — AB (ref 36.0–46.0)
Hemoglobin: 9.4 g/dL — ABNORMAL LOW (ref 12.0–15.0)
Immature Granulocytes: 1 %
Lymphocytes Relative: 16 %
Lymphs Abs: 2.2 10*3/uL (ref 0.7–4.0)
MCH: 27 pg (ref 26.0–34.0)
MCHC: 30.5 g/dL (ref 30.0–36.0)
MCV: 88.5 fL (ref 80.0–100.0)
MONO ABS: 0.7 10*3/uL (ref 0.1–1.0)
Monocytes Relative: 5 %
Neutro Abs: 10.4 10*3/uL — ABNORMAL HIGH (ref 1.7–7.7)
Neutrophils Relative %: 75 %
Platelets: 268 10*3/uL (ref 150–400)
RBC: 3.48 MIL/uL — ABNORMAL LOW (ref 3.87–5.11)
RDW: 15.6 % — ABNORMAL HIGH (ref 11.5–15.5)
WBC: 13.9 10*3/uL — ABNORMAL HIGH (ref 4.0–10.5)
nRBC: 0 % (ref 0.0–0.2)

## 2018-03-02 SURGERY — ARTERIOVENOUS (AV) FISTULA CREATION
Anesthesia: Monitor Anesthesia Care | Site: Arm Upper | Laterality: Right

## 2018-03-02 MED ORDER — SODIUM CHLORIDE 0.9 % IV SOLN
INTRAVENOUS | Status: AC
  Filled 2018-03-02: qty 1.2

## 2018-03-02 MED ORDER — 0.9 % SODIUM CHLORIDE (POUR BTL) OPTIME
TOPICAL | Status: DC | PRN
  Administered 2018-03-02: 1000 mL

## 2018-03-02 MED ORDER — PROPOFOL 10 MG/ML IV BOLUS
INTRAVENOUS | Status: AC
  Filled 2018-03-02: qty 20

## 2018-03-02 MED ORDER — PHENYLEPHRINE 40 MCG/ML (10ML) SYRINGE FOR IV PUSH (FOR BLOOD PRESSURE SUPPORT)
PREFILLED_SYRINGE | INTRAVENOUS | Status: AC
  Filled 2018-03-02: qty 10

## 2018-03-02 MED ORDER — DEXAMETHASONE SODIUM PHOSPHATE 4 MG/ML IJ SOLN
INTRAMUSCULAR | Status: DC | PRN
  Administered 2018-03-02: 6 mg via INTRAVENOUS

## 2018-03-02 MED ORDER — SODIUM CHLORIDE 0.9 % IV SOLN
INTRAVENOUS | Status: DC
  Administered 2018-03-02: 09:00:00 via INTRAVENOUS

## 2018-03-02 MED ORDER — LIDOCAINE 2% (20 MG/ML) 5 ML SYRINGE
INTRAMUSCULAR | Status: AC
  Filled 2018-03-02: qty 5

## 2018-03-02 MED ORDER — MIDAZOLAM HCL 5 MG/5ML IJ SOLN
INTRAMUSCULAR | Status: DC | PRN
  Administered 2018-03-02: 2 mg via INTRAVENOUS

## 2018-03-02 MED ORDER — DIPHENHYDRAMINE HCL 50 MG/ML IJ SOLN
INTRAMUSCULAR | Status: AC
  Filled 2018-03-02: qty 1

## 2018-03-02 MED ORDER — ONDANSETRON HCL 4 MG/2ML IJ SOLN
INTRAMUSCULAR | Status: AC
  Filled 2018-03-02: qty 2

## 2018-03-02 MED ORDER — ONDANSETRON HCL 4 MG/2ML IJ SOLN
INTRAMUSCULAR | Status: DC | PRN
  Administered 2018-03-02: 4 mg via INTRAVENOUS

## 2018-03-02 MED ORDER — CEFAZOLIN SODIUM 1 G IJ SOLR
INTRAMUSCULAR | Status: AC
  Filled 2018-03-02: qty 20

## 2018-03-02 MED ORDER — LIDOCAINE-EPINEPHRINE (PF) 1 %-1:200000 IJ SOLN
INTRAMUSCULAR | Status: DC | PRN
  Administered 2018-03-02: 11 mL

## 2018-03-02 MED ORDER — PROPOFOL 500 MG/50ML IV EMUL
INTRAVENOUS | Status: DC | PRN
  Administered 2018-03-02: 60 ug/kg/min via INTRAVENOUS

## 2018-03-02 MED ORDER — FENTANYL CITRATE (PF) 250 MCG/5ML IJ SOLN
INTRAMUSCULAR | Status: AC
  Filled 2018-03-02: qty 5

## 2018-03-02 MED ORDER — FENTANYL CITRATE (PF) 100 MCG/2ML IJ SOLN
INTRAMUSCULAR | Status: DC | PRN
  Administered 2018-03-02 (×3): 50 ug via INTRAVENOUS

## 2018-03-02 MED ORDER — MIDAZOLAM HCL 2 MG/2ML IJ SOLN
INTRAMUSCULAR | Status: AC
  Filled 2018-03-02: qty 2

## 2018-03-02 MED ORDER — CHLORHEXIDINE GLUCONATE CLOTH 2 % EX PADS: 6.0000 | MEDICATED_PAD | Freq: Every day | CUTANEOUS | Status: DC

## 2018-03-02 MED ORDER — DIPHENHYDRAMINE HCL 50 MG/ML IJ SOLN
INTRAMUSCULAR | Status: DC | PRN
  Administered 2018-03-02: 12.5 mg via INTRAVENOUS

## 2018-03-02 MED ORDER — PROPOFOL 1000 MG/100ML IV EMUL
INTRAVENOUS | Status: AC
  Filled 2018-03-02: qty 100

## 2018-03-02 MED ORDER — HYDROCODONE-ACETAMINOPHEN 5-325 MG PO TABS: 1.0000 | ORAL_TABLET | ORAL | Status: DC | PRN

## 2018-03-02 MED ORDER — DEXAMETHASONE SODIUM PHOSPHATE 10 MG/ML IJ SOLN
INTRAMUSCULAR | Status: AC
  Filled 2018-03-02: qty 1

## 2018-03-02 MED ORDER — LIDOCAINE-EPINEPHRINE (PF) 1 %-1:200000 IJ SOLN
INTRAMUSCULAR | Status: AC
  Filled 2018-03-02: qty 30

## 2018-03-02 MED ORDER — PROPOFOL 10 MG/ML IV BOLUS
INTRAVENOUS | Status: DC | PRN
  Administered 2018-03-02 (×2): 20 mg via INTRAVENOUS
  Administered 2018-03-02: 30 mg via INTRAVENOUS

## 2018-03-02 MED ORDER — LIDOCAINE 2% (20 MG/ML) 5 ML SYRINGE
INTRAMUSCULAR | Status: DC | PRN
  Administered 2018-03-02: 40 mg via INTRAVENOUS

## 2018-03-02 MED ORDER — SODIUM CHLORIDE 0.9 % IV SOLN
INTRAVENOUS | Status: DC | PRN
  Administered 2018-03-02: 500 mL

## 2018-03-02 SURGICAL SUPPLY — 29 items
ARMBAND PINK RESTRICT EXTREMIT (MISCELLANEOUS) ×2 IMPLANT
CANISTER SUCT 3000ML PPV (MISCELLANEOUS) ×2 IMPLANT
CLIP VESOCCLUDE MED 6/CT (CLIP) ×2 IMPLANT
CLIP VESOCCLUDE SM WIDE 6/CT (CLIP) ×2 IMPLANT
COVER PROBE W GEL 5X96 (DRAPES) ×2 IMPLANT
COVER WAND RF STERILE (DRAPES) IMPLANT
DERMABOND ADVANCED (GAUZE/BANDAGES/DRESSINGS) ×1
DERMABOND ADVANCED .7 DNX12 (GAUZE/BANDAGES/DRESSINGS) ×1 IMPLANT
ELECT REM PT RETURN 9FT ADLT (ELECTROSURGICAL) ×2
ELECTRODE REM PT RTRN 9FT ADLT (ELECTROSURGICAL) ×1 IMPLANT
GAUZE SPONGE 4X4 12PLY STRL (GAUZE/BANDAGES/DRESSINGS) ×2 IMPLANT
GLOVE BIO SURGEON STRL SZ7.5 (GLOVE) ×4 IMPLANT
GOWN STRL REUS W/ TWL LRG LVL3 (GOWN DISPOSABLE) ×3 IMPLANT
GOWN STRL REUS W/ TWL XL LVL3 (GOWN DISPOSABLE) ×2 IMPLANT
GOWN STRL REUS W/TWL LRG LVL3 (GOWN DISPOSABLE) ×3
GOWN STRL REUS W/TWL XL LVL3 (GOWN DISPOSABLE) ×2
INSERT FOGARTY SM (MISCELLANEOUS) IMPLANT
KIT BASIN OR (CUSTOM PROCEDURE TRAY) ×2 IMPLANT
KIT TURNOVER KIT B (KITS) ×2 IMPLANT
NS IRRIG 1000ML POUR BTL (IV SOLUTION) ×2 IMPLANT
PACK CV ACCESS (CUSTOM PROCEDURE TRAY) ×2 IMPLANT
PAD ARMBOARD 7.5X6 YLW CONV (MISCELLANEOUS) ×4 IMPLANT
SUT MNCRL AB 4-0 PS2 18 (SUTURE) ×2 IMPLANT
SUT PROLENE 6 0 BV (SUTURE) ×4 IMPLANT
SUT VIC AB 3-0 SH 27 (SUTURE) ×1
SUT VIC AB 3-0 SH 27X BRD (SUTURE) ×1 IMPLANT
TOWEL GREEN STERILE (TOWEL DISPOSABLE) ×2 IMPLANT
UNDERPAD 30X30 (UNDERPADS AND DIAPERS) ×2 IMPLANT
WATER STERILE IRR 1000ML POUR (IV SOLUTION) ×2 IMPLANT

## 2018-03-02 NOTE — Progress Notes (Signed)
Patient is scheduled for hemodialysis at the [facility] outpatient clinic on MWF @ 12:30.  Schedule has been confirmed with patient.

## 2018-03-02 NOTE — Op Note (Signed)
    Patient name: Sara Meza MRN: [id number] DOB: 28-Jun-1975 Sex: female  03/02/2018 Pre-operative Diagnosis: End-stage renal disease Post-operative diagnosis:  Same Surgeon:  Erlene Quan C. Donzetta Matters, MD Assistant: Arlee Muslim PA Procedure Performed: Right upper arm brachiocephalic AV fistula creation  Indications: 43 year old female now with end-stage renal disease on dialysis via right IJ catheter.  She is in need of permanent access appears to have suitable vein in her right upper extremity for fistula creation.  Findings: By on table duplex the cephalic vein in the forearm is thrombosed.  It appears at least 5 mm in the antecubitum but does shrink down the approximately 2-1/2 mm in the mid upper arm but then again dilates to 4 mm.  It had a large branch which was thrombosed just above the antecubital this was ligated.  I was easily able to dilated to 3-1/2 mm in the antecubitum.  The artery was free of disease was 2-1/2 mm.  At completion there was a thrill that could be traced throughout the upper arm there is palpable radial pulse the wrist.   Procedure:  The patient was identified in the holding area and taken to the operating room where she is placed upon the operative table and MAC anesthesia was induced.  She was sterilely prepped draped in the right upper extremity usual fashion antibiotics were administered and timeout was called.  We began using ultrasound to evaluate the cephalic vein in the upper arm with the above findings.  I really could not find any basilic vein until further in the upper arm.  Cephalic vein in the forearm was not suitable and there was no suitable basilic vein either.  With this the area below the antecubitum was anesthetized with some 1% lidocaine with epinephrine.  I made a transverse incision identified the vein where there was a large branch coming off.  I marked this for orientation.  I dissected through the deep fascia identified the brachial artery placed a vessel  loop around this.  I then placed clamps on my vein cephalad and divided it distally after clipping it.  I spatulated it.  I passed dilators one branch was clearly thrombosed I ligated this between a clip and tie.  I was able to dilate approximately 3-1/2 mm.  I flushed with heparinized saline and reclamped it.  I then clamped the artery distally proximally opened longitudinally.  A flushed with heparinized saline both directions.  I saw the vein end-to-side with 6-0 Prolene suture.  Prior to completion anastomosis flushing by directions.  Upon completion there was a good thrill confirmed with Doppler the could be traced throughout the upper arm.  I used ultrasound again it demonstrated that the vein was enlarging throughout the upper arm.  There was also palpable radial artery pulse at the wrist.  We obtained hemostasis irrigated the wound closed in layers Vicryl Monocryl placed Dermabond to the level of skin.  All counts were correct at completion.  She tolerated procedure without immediate complication.  She was awakened anesthesia transferred to PACU in stable condition.  EBL: 20 cc   Phinehas Grounds C. Donzetta Matters, MD [facility] [place] Office: [phone] Pager: [phone]

## 2018-03-02 NOTE — Transfer of Care (Signed)
Immediate Anesthesia Transfer of Care Note  Patient: Sara Meza  Procedure(s) Performed: ARTERIOVENOUS (AV) FISTULA CREATION (Right Arm Upper)  Patient Location: PACU  Anesthesia Type:MAC  Level of Consciousness: awake, alert  and patient cooperative  Airway & Oxygen Therapy: Patient Spontanous Breathing and Patient connected to face mask oxygen  Post-op Assessment: Report given to RN and Post -op Vital signs reviewed and stable  Post vital signs: Reviewed and stable  Last Vitals:  Vitals Value Taken Time  BP    Temp    Pulse 82 03/02/2018 10:42 AM  Resp    SpO2 100 % 03/02/2018 10:42 AM  Vitals shown include unvalidated device data.  Last Pain:  Vitals:   03/02/18 0735  TempSrc: Oral  PainSc: 0-No pain      Patients Stated Pain Goal: 1 (23/76/28 3151)  Complications: No apparent anesthesia complications

## 2018-03-02 NOTE — Progress Notes (Signed)
[facility] Progress Note  Patient: Sara Meza CBJ:[id number]   PCP: [name], PA-C DOB: 04/24/1975   DOA: 02/22/2018   DOS: 03/02/2018   Date of Service: the patient was seen and examined on 03/02/2018  Brief hospital course: 43 year old with past medical history relevant for hypertension hypertensive retinopathy admitted on 02/22/2018 with worsening renal function, and non-anion gap metabolic acidosis-likely will be requiring hemodialysis, Bil LE Dopplers negative for DVT, patient was started on HD on 02/24/2018, had bilateral upper extremity vein mapping on 02/27/2018, awaiting permanent hemodialysis access placement on 03/02/18, CLIP underway  Subjective: No acute complaints abdominal cramps have improved.  No nausea no vomiting.  No further diarrhea.  Assessment and Plan: 1. 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 Nephrology input appreciated, was initiated on HD on 02/24/18 had bilateral upper extremity vein mapping on 02/27/2018, awaiting permanent hemodialysis access placement by vascular surgery on 03/02/18,  treated with  bicarb for non-anion gap metabolic acidosis renal ultrasound with bil atrophic kidneys,  Calcitriol  And Auryxia,  protein electrophoresis is unremarkable, hepatitis B is negative,  hepatitis C is pending, complement testing reveals no complement deficiency, ANA and ANCA titers are pending Per nephrologist kidney biopsy may not change management or outcome UA with glucosuria, hematuria and proteinuria  2. HTN history of hypertensive retinopathy,  stable at this time,  PTA was noncompliant with antihypertensives,  nephrologist advises holding off on anti-hypertensives at this time  3.  Acute on chronic anemia of CKD patient with iron deficiency anemia secondary to menorrhagia and also anemia of chronic kidney disease. Hemoglobin is up to 8.1  from 5.7 after transfusion of 2 units on 02/22/18  Aranesp and iron infusion by nephrologist  4. Secondary  Hyperparathyroidism patient with PTH of 540 and phosphorous was 9.4, trending down, c/n calcitriol and  c/n Auryxia per nephrologist  5. Vitamin D deficiency vitamin D is low at 12, replace with 50,000 units weekly  6.  Abdominal pain with cramping and diarrhea. X-ray abdomen unremarkable, CBC and CMP stable.  Lipase normal.  Symptoms resolved.  Mild increase and WBC monitor.  Diet: renal diet DVT Prophylaxis: subcutaneous Heparin  Advance goals of care discussion: full code  Family Communication: family was present at bedside, at the time of interview. The pt provided permission to discuss medical plan with the family. Opportunity was given to ask question and all questions were answered satisfactorily.   Disposition:  Discharge to home.  Consultants: nephrology  Vascular surgery   Procedures: HD TDC  Scheduled Meds: . calcitRIOL  0.25 mcg Oral Daily  . Chlorhexidine Gluconate Cloth  6 each Topical Q0600  . Chlorhexidine Gluconate Cloth  6 each Topical Q0600  . darbepoetin (ARANESP) injection - NON-DIALYSIS  100 mcg Subcutaneous Q Thu-1800  . ferric citrate  630 mg Oral TID WC  . pantoprazole  40 mg Oral BID AC  . Vitamin D (Ergocalciferol)  50,000 Units Oral Q7 days   Continuous Infusions: . sodium chloride 10 mL/hr at 03/02/18 0830  . ferric gluconate (FERRLECIT/NULECIT) IV 125 mg (03/01/18 1438)   PRN Meds: acetaminophen, HYDROcodone-acetaminophen Antibiotics: Anti-infectives (From admission, onward)   Start     Dose/Rate Route Frequency Ordered Stop   03/02/18 0800  ceFAZolin (ANCEF) IVPB 1 g/50 mL premix    Note to Pharmacy:  Send with pt to OR   1 g 100 mL/hr over 30 Minutes Intravenous To [facility] 03/01/18 0828 03/02/18 1009   02/24/18 1534  ceFAZolin (ANCEF) powder  As needed 02/24/18 1534 02/24/18 1534   02/24/18 1518  ceFAZolin (ANCEF) 2-4 GM/100ML-% IVPB    Note to Pharmacy:  [place]   : cabinet override      02/24/18 1518 02/25/18  0329   02/23/18 1300  ceFAZolin (ANCEF) IVPB 2g/100 mL premix     2 g 200 mL/hr over 30 Minutes Intravenous  Once 02/23/18 1157 02/24/18 0038       Objective: Physical Exam: Vitals:   03/02/18 1045 03/02/18 1100 03/02/18 1206 03/02/18 1348  BP: 106/69 110/67 121/76 114/70  Pulse: 75 71 81 91  Resp: 16 16    Temp: 98 F (36.7 C)   98.2 F (36.8 C)  TempSrc:    Oral  SpO2: 97% 96%  96%  Weight:      Height:        Intake/Output Summary (Last 24 hours) at 03/02/2018 1811 Last data filed at 03/02/2018 1040 Gross per 24 hour  Intake 530.11 ml  Output 201 ml  Net 329.11 ml   Filed Weights   03/01/18 1150 03/01/18 1528 03/02/18 0442  Weight: 70.9 kg 70.7 kg 70.3 kg   General: Alert, Awake and Oriented to Time, Place and Person. Appear in mild distress, affect appropriate Eyes: PERRL, Conjunctiva normal ENT: Oral Mucosa clear moist. Neck: no JVD, no Abnormal Mass Or lumps Cardiovascular: S1 and S2 Present, no Murmur, Peripheral Pulses Present Respiratory: normal respiratory effort, Bilateral Air entry equal and Decreased, no use of accessory muscle, Clear to Auscultation, no Crackles, no wheezes Abdomen: Bowel Sound present, Soft and mild right tenderness, no hernia Skin: no redness, no Rash, no induration Extremities: no Pedal edema, no calf tenderness Neurologic: Grossly no focal neuro deficit. Bilaterally Equal motor strength  Data Reviewed: CBC: Recent Labs  Lab 02/26/18 0450 02/27/18 2222 03/01/18 0408 03/01/18 1846 03/02/18 0505  WBC 8.2 10.3 8.6 12.4* 13.9*  NEUTROABS  --   --   --  10.4* 10.4*  HGB 8.9* 8.2* 8.2* 8.5* 9.4*  HCT 28.1* 25.6* 26.6* 26.6* 30.8*  MCV 84.9 86.5 87.8 87.2 88.5  PLT 266 234 206 208 287   Basic Metabolic Panel: Recent Labs  Lab 02/24/18 0451 02/26/18 0450 02/27/18 2222 03/01/18 0408 03/01/18 1846  NA 140 140 136 137 138  K 4.2 3.9 3.4* 4.4 3.7  CL 113* 107 102 102 98  CO2 14* 17* 22 22 28   GLUCOSE 89 92 124* 94 186*  BUN  91* 66* 53* 37* 11  CREATININE 10.60* 8.92* 7.73* 6.32* 3.62*  CALCIUM 8.6* 9.6 9.1 10.1 9.3  PHOS 7.9* 6.8* 4.8*  --   --     Liver Function Tests: Recent Labs  Lab 02/24/18 0451 02/26/18 0450 02/27/18 2222 03/01/18 1846  AST  --   --   --  27  ALT  --   --   --  10  ALKPHOS  --   --   --  59  BILITOT  --   --   --  0.6  PROT  --   --   --  7.0  ALBUMIN 3.1* 3.1* 3.0* 3.4*   Recent Labs  Lab 03/01/18 1846  LIPASE 63*   No results for input(s): AMMONIA in the last 168 hours. Coagulation Profile: No results for input(s): INR, PROTIME in the last 168 hours. Cardiac Enzymes: No results for input(s): CKTOTAL, CKMB, CKMBINDEX, TROPONINI in the last 168 hours. BNP (last 3 results) No results for input(s): PROBNP in the last 8760 hours.  CBG: No results for input(s): GLUCAP in the last 168 hours. Studies: Dg Abd Acute 2+v W 1v Chest  Result Date: 03/01/2018 CLINICAL DATA:  43 year old female with constipation and right upper quadrant pain EXAM: DG ABDOMEN ACUTE W/ 1V CHEST COMPARISON:  None. FINDINGS: Chest: Cardiomediastinal silhouette within normal limits. No evidence of central vascular congestion. No pneumothorax or pleural effusion. No confluent airspace disease. Right IJ tunneled HD catheter. Abdominal: Surgical changes of cholecystectomy. Gas within small bowel and colon. No abnormal distention. No large stool burden. No unexpected radiopaque foreign body. Pelvic phleboliths. No displaced fracture. IMPRESSION: Chest: No radiographic evidence of acute cardiopulmonary disease. Right IJ tunneled hemodialysis catheter. Abdomen: Nonobstructive bowel gas pattern with no significant stool burden. Electronically Signed   By: Corrie Mckusick D.O.   On: 03/01/2018 19:50     Time spent: 35 minutes  Author: Berle Mull, MD [facility] 03/02/2018 6:11 PM  To reach On-call, see care teams to locate the attending and reach out to them via [email or web address]. If 7PM-7AM, please contact  night-coverage If you still have difficulty reaching the attending provider, please page the [facility] (Director on Call) for [facility] on amion for assistance.

## 2018-03-02 NOTE — Telephone Encounter (Signed)
sch appt spk to pt mld ltr 04/13/2018 1pm Dialysis Duplex 2pm p/o PA

## 2018-03-02 NOTE — Anesthesia Procedure Notes (Signed)
Procedure Name: MAC Date/Time: 03/02/2018 10:01 AM Performed by: Orlie Dakin, CRNA Pre-anesthesia Checklist: Patient identified, Emergency Drugs available, Suction available and Patient being monitored Patient Re-evaluated:Patient Re-evaluated prior to induction Oxygen Delivery Method: Simple face mask Preoxygenation: Pre-oxygenation with 100% oxygen

## 2018-03-02 NOTE — Progress Notes (Signed)
   Patient evaluated postop.  She is strong thrill in the right upper arm her right radial pulse is palpable and her hand is well-perfused.  She will follow-up in 4 to 6 weeks with dialysis duplex for evaluation.  If there are issues prior I will certainly see her sooner.  Makynna Manocchio C. Donzetta Matters, MD [facility] Office: [phone] Pager: [phone]

## 2018-03-02 NOTE — Telephone Encounter (Signed)
-----   Message from Dagoberto Ligas, PA-C sent at 03/02/2018 10:36 AM EST -----  Can you schedule an appt for this pt in 4-6 weeks with fistula duplex on PA or [facility].  PO R arm brachiocephalic fistula. Thanks, [facility]

## 2018-03-02 NOTE — Anesthesia Preprocedure Evaluation (Signed)
Anesthesia Evaluation  Patient identified by MRN, date of birth, ID band Patient awake    Reviewed: Allergy & Precautions, NPO status , Patient's Chart, lab work & pertinent test results  Airway Mallampati: I       Dental no notable dental hx. (+) Teeth Intact   Pulmonary neg pulmonary ROS,    Pulmonary exam normal breath sounds clear to auscultation       Cardiovascular hypertension, Pt. on medications Normal cardiovascular exam Rhythm:Regular Rate:Normal     Neuro/Psych negative neurological ROS  negative psych ROS   GI/Hepatic negative GI ROS, Neg liver ROS,   Endo/Other    Renal/GU Renal InsufficiencyRenal disease  negative genitourinary   Musculoskeletal negative musculoskeletal ROS (+)   Abdominal Normal abdominal exam  (+)   Peds  Hematology  (+) anemia ,   Anesthesia Other Findings   Reproductive/Obstetrics                             Anesthesia Physical Anesthesia Plan  ASA: II  Anesthesia Plan:    Post-op Pain Management:    Induction:   PONV Risk Score and Plan: 3 and Ondansetron and Midazolam  Airway Management Planned: Natural Airway, Nasal Cannula and Simple Face Mask  Additional Equipment:   Intra-op Plan:   Post-operative Plan:   Informed Consent: I have reviewed the patients History and Physical, chart, labs and discussed the procedure including the risks, benefits and alternatives for the proposed anesthesia with the patient or authorized representative who has indicated his/her understanding and acceptance.     Dental advisory given  Plan Discussed with: CRNA  Anesthesia Plan Comments:         Anesthesia Quick Evaluation

## 2018-03-02 NOTE — Plan of Care (Signed)
  Problem: Clinical Measurements: Goal: Will remain free from infection Outcome: Progressing Goal: Respiratory complications will improve Outcome: Progressing   Problem: Activity: Goal: Risk for activity intolerance will decrease Outcome: Completed/Met   Problem: Safety: Goal: Ability to remain free from injury will improve Outcome: Completed/Met

## 2018-03-02 NOTE — Progress Notes (Signed)
  Progress Note    03/02/2018 9:11 AM Day of Surgery  Subjective:  No overnight issues  Vitals:   03/02/18 0439 03/02/18 0735  BP: 120/68 109/77  Pulse: 91 79  Resp: 18 20  Temp: 98.9 F (37.2 C) 99 F (37.2 C)  SpO2: 99% 96%    Physical Exam: aaox3 Non labored respirations Palpable right radial pulse  CBC    Component Value Date/Time   WBC 13.9 (H) 03/02/2018 0505   RBC 3.48 (L) 03/02/2018 0505   HGB 9.4 (L) 03/02/2018 0505   HCT 30.8 (L) 03/02/2018 0505   PLT 268 03/02/2018 0505   MCV 88.5 03/02/2018 0505   MCH 27.0 03/02/2018 0505   MCHC 30.5 03/02/2018 0505   RDW 15.6 (H) 03/02/2018 0505   LYMPHSABS 2.2 03/02/2018 0505   MONOABS 0.7 03/02/2018 0505   EOSABS 0.5 03/02/2018 0505   BASOSABS 0.1 03/02/2018 0505    BMET    Component Value Date/Time   NA 138 03/01/2018 1846   K 3.7 03/01/2018 1846   CL 98 03/01/2018 1846   CO2 28 03/01/2018 1846   GLUCOSE 186 (H) 03/01/2018 1846   BUN 11 03/01/2018 1846   CREATININE 3.62 (H) 03/01/2018 1846   CREATININE 10.65 (H) 02/21/2018 1541   CALCIUM 9.3 03/01/2018 1846   CALCIUM 8.9 02/22/2018 1423   GFRNONAA 14 (L) 03/01/2018 1846   GFRNONAA 4 (L) 02/21/2018 1541   GFRAA 17 (L) 03/01/2018 1846   GFRAA 5 (L) 02/21/2018 1541    INR    Component Value Date/Time   INR 1.11 02/23/2018 1232     Intake/Output Summary (Last 24 hours) at 03/02/2018 0911 Last data filed at 03/02/2018 0441 Gross per 24 hour  Intake 470.11 ml  Output 201 ml  Net 269.11 ml     Assessment:  43 y.o. female is here with need for hd access and appears to have suitable vein on the right.  Plan: OR today for right arm avf vs graft.   Pelagia Iacobucci C. Donzetta Matters, MD [facility] [place] Office: [phone] Pager: [phone]  03/02/2018 9:11 AM

## 2018-03-02 NOTE — Progress Notes (Addendum)
[facility] NEPHROLOGY PROGRESS NOTE  Assessment/ Plan: Pt is a 43 y.o. yo female with past medical history of hypertension for about 3 years, supposed to be on HCTZ but not taking, menorrhagia, recently diagnosed hypertensive retinopathy by her ophthalmologist, referred by PCP to the ER for the management of severe anemia and elevated serum creatinine level found on routine lab.   #AKI versus progressive CKD:  Suspected CKD progressed to ESRD now, possibly due to HTN (though BP ok off meds now) versus undiagnosed glomerular process(proteinuria).  Ultrasound kidneys with bilateral atrophic kidney and increased cortical echogenicity. She does have hypertensive retinopathy. Urinalysis with no RBC, no WBC, PCR 3.2.  Patient is nonoliguric. -complements, HepBsAg, HIV, SPEP negative. -Follow up hepatitis C antibody,  ANA, ANCA-PTH elevated and atrophic kidneys I doubt there is any significant renal recovery.  Patient  has nausea, insomnia, dysgeusia and generalized weakness symptoms consistent of uremia.  I do not think biopsy will be helpful given atrophic kidneys.  Patient received first dialysis on 2/7, 3rd 2/10.  Plan for third treatment today.  No ultrafiltration.  -Vein mapping complete - Appreciate VVS consult re perm access.  Plan today AVF. - Dialyzing on MWF schedule now - HD tomorrow -CLIP underway  #Anemia due to acute blood loss and CKD: Received 2 units of red blood cell transfusion.  Given history of menorrhagia I recommend GYN consult/pelvic ultrasound.   -IV iron and Aranesp. Was not receiving iron so ordered 2/12 and increased aranesp dose due to downtrending Hb in the 8s.   #Secondary hyperparathyroidism: PTH 540, high phosphorus.  Started Lorin Picket.  Phos improved to 4.8. Calcitriol.  #History of hypertension: Monitor blood pressure.  #dispo: as soon as outpt dialysis arranged she should be ready for d/c.  ADDENDUM: Confirmed HD chair MWF High pt on Monday - 12:30.   Plan HD tomorrow AM then D/C home.   Subjective: Seen and examined.  Overall feeling quite well.  Abd pain resolved, said she had a lot of gas.  For AV access today.    Objective Vital signs in last 24 hours: Vitals:   03/01/18 1945 03/02/18 0439 03/02/18 0442 03/02/18 0735  BP: 127/83 120/68  109/77  Pulse: 96 91  79  Resp: 18 18  20   Temp: 98.8 F (37.1 C) 98.9 F (37.2 C)  99 F (37.2 C)  TempSrc: Oral Oral  Oral  SpO2: 100% 99%  96%  Weight:   70.3 kg   Height:       Weight change: 0.048 kg  Intake/Output Summary (Last 24 hours) at 03/02/2018 0953 Last data filed at 03/02/2018 0441 Gross per 24 hour  Intake 470.11 ml  Output 201 ml  Net 269.11 ml       Labs: Basic Metabolic Panel: Recent Labs  Lab 02/24/18 0451 02/26/18 0450 02/27/18 2222 03/01/18 0408 03/01/18 1846  NA 140 140 136 137 138  K 4.2 3.9 3.4* 4.4 3.7  CL 113* 107 102 102 98  CO2 14* 17* 22 22 28   GLUCOSE 89 92 124* 94 186*  BUN 91* 66* 53* 37* 11  CREATININE 10.60* 8.92* 7.73* 6.32* 3.62*  CALCIUM 8.6* 9.6 9.1 10.1 9.3  PHOS 7.9* 6.8* 4.8*  --   --    Liver Function Tests: Recent Labs  Lab 02/26/18 0450 02/27/18 2222 03/01/18 1846  AST  --   --  27  ALT  --   --  10  ALKPHOS  --   --  59  BILITOT  --   --  0.6  PROT  --   --  7.0  ALBUMIN 3.1* 3.0* 3.4*   Recent Labs  Lab 03/01/18 1846  LIPASE 63*   No results for input(s): AMMONIA in the last 168 hours. CBC: Recent Labs  Lab 02/26/18 0450 02/27/18 2222 03/01/18 0408 03/01/18 1846 03/02/18 0505  WBC 8.2 10.3 8.6 12.4* 13.9*  NEUTROABS  --   --   --  10.4* 10.4*  HGB 8.9* 8.2* 8.2* 8.5* 9.4*  HCT 28.1* 25.6* 26.6* 26.6* 30.8*  MCV 84.9 86.5 87.8 87.2 88.5  PLT 266 234 206 208 268   Cardiac Enzymes: No results for input(s): CKTOTAL, CKMB, CKMBINDEX, TROPONINI in the last 168 hours. CBG: No results for input(s): GLUCAP in the last 168 hours.  Iron Studies:  No results for input(s): IRON, TIBC, TRANSFERRIN,  FERRITIN in the last 72 hours. Studies/Results: Dg Abd Acute 2+v W 1v Chest  Result Date: 03/01/2018 CLINICAL DATA:  43 year old female with constipation and right upper quadrant pain EXAM: DG ABDOMEN ACUTE W/ 1V CHEST COMPARISON:  None. FINDINGS: Chest: Cardiomediastinal silhouette within normal limits. No evidence of central vascular congestion. No pneumothorax or pleural effusion. No confluent airspace disease. Right IJ tunneled HD catheter. Abdominal: Surgical changes of cholecystectomy. Gas within small bowel and colon. No abnormal distention. No large stool burden. No unexpected radiopaque foreign body. Pelvic phleboliths. No displaced fracture. IMPRESSION: Chest: No radiographic evidence of acute cardiopulmonary disease. Right IJ tunneled hemodialysis catheter. Abdomen: Nonobstructive bowel gas pattern with no significant stool burden. Electronically Signed   By: Corrie Mckusick D.O.   On: 03/01/2018 19:50    Medications: Infusions: . sodium chloride 10 mL/hr at 03/02/18 0830  . [MAR Hold]  ceFAZolin (ANCEF) IV    . [MAR Hold] ferric gluconate (FERRLECIT/NULECIT) IV 125 mg (03/01/18 1438)    Scheduled Medications: . [MAR Hold] calcitRIOL  0.25 mcg Oral Daily  . [MAR Hold] Chlorhexidine Gluconate Cloth  6 each Topical Q0600  . [MAR Hold] darbepoetin (ARANESP) injection - NON-DIALYSIS  100 mcg Subcutaneous Q Thu-1800  . [MAR Hold] ferric citrate  630 mg Oral TID WC  . [MAR Hold] pantoprazole  40 mg Oral BID AC  . [MAR Hold] Vitamin D (Ergocalciferol)  50,000 Units Oral Q7 days    have reviewed scheduled and prn medications.  Physical Exam: General: Not in distress, comfortable Heart:RRR, s1s2 nl no rubs Lungs: Clear bilateral, no wheezing or crackles Abdomen:soft, Non-tender, non-distended Extremities: No edema Neurology: Alert, awake Skin: No rashes Psych: pleasant affect  Justin Mend 03/02/2018,9:53 AM  LOS: 8 days

## 2018-03-02 NOTE — Discharge Instructions (Signed)
° °  [facility] [place] ° °Discharge Instructions ° °AV Fistula or Graft Surgery for Dialysis Access ° °Please refer to the following instructions for your post-procedure care. Your surgeon or physician assistant will discuss any changes with you. ° °Activity ° °You may drive the day following your surgery, if you are comfortable and no longer taking prescription pain medication. Resume full activity as the soreness in your incision resolves. ° °Bathing/Showering ° °You may shower after you go home. Keep your incision dry for 48 hours. Do not soak in a bathtub, hot tub, or swim until the incision heals completely. You may not shower if you have a hemodialysis catheter. ° °Incision Care ° °Clean your incision with mild soap and water after 48 hours. Pat the area dry with a clean towel. You do not need a bandage unless otherwise instructed. Do not apply any ointments or creams to your incision. You may have skin glue on your incision. Do not peel it off. It will come off on its own in about one week. Your arm may swell a bit after surgery. To reduce swelling use pillows to elevate your arm so it is above your heart. Your doctor will tell you if you need to lightly wrap your arm with an ACE bandage. ° °Diet ° °Resume your normal diet. There are not special food restrictions following this procedure. In order to heal from your surgery, it is CRITICAL to get adequate nutrition. Your body requires vitamins, minerals, and protein. Vegetables are the best source of vitamins and minerals. Vegetables also provide the perfect balance of protein. Processed food has little nutritional value, so try to avoid this. ° °Medications ° °Resume taking all of your medications. If your incision is causing pain, you may take over-the counter pain relievers such as acetaminophen (Tylenol). If you were prescribed a stronger pain medication, please be aware these medications can cause nausea and constipation. Prevent  nausea by taking the medication with a snack or meal. Avoid constipation by drinking plenty of fluids and eating foods with high amount of fiber, such as fruits, vegetables, and grains. Do not take Tylenol if you are taking prescription pain medications. ° ° ° ° °Follow up °Your surgeon may want to see you in the office following your access surgery. If so, this will be arranged at the time of your surgery. ° °Please call us immediately for any of the following conditions: ° °Increased pain, redness, drainage (pus) from your incision site °Fever of 101 degrees or higher °Severe or worsening pain at your incision site °Hand pain or numbness. ° °Reduce your risk of vascular disease: ° °Stop smoking. If you would like help, call [facility] at 1-800-QUIT-NOW (1-800-784-8669) or [place] at 336-586-4000 ° °Manage your cholesterol °Maintain a desired weight °Control your diabetes °Keep your blood pressure down ° °Dialysis ° °It will take several weeks to several months for your new dialysis access to be ready for use. Your surgeon will determine when it is OK to use it. Your nephrologist will continue to direct your dialysis. You can continue to use your Permcath until your new access is ready for use. ° °If you have any questions, please call the office at [phone]. ° °

## 2018-03-02 NOTE — Anesthesia Postprocedure Evaluation (Signed)
Anesthesia Post Note  Patient: Sara Meza  Procedure(s) Performed: ARTERIOVENOUS (AV) FISTULA CREATION (Right Arm Upper)     Patient location during evaluation: PACU Anesthesia Type: MAC Level of consciousness: awake Pain management: pain level controlled Vital Signs Assessment: post-procedure vital signs reviewed and stable Respiratory status: spontaneous breathing Cardiovascular status: stable Postop Assessment: no apparent nausea or vomiting Anesthetic complications: no    Last Vitals:  Vitals:   03/02/18 1206 03/02/18 1348  BP: 121/76 114/70  Pulse: 81 91  Resp:    Temp:  36.8 C  SpO2:  96%    Last Pain:  Vitals:   03/02/18 1348  TempSrc: Oral  PainSc: 0-No pain   Pain Goal: Patients Stated Pain Goal: 1 (03/02/18 0155)                 Huston Foley

## 2018-03-03 ENCOUNTER — Encounter (HOSPITAL_COMMUNITY): Payer: Self-pay | Admitting: Vascular Surgery

## 2018-03-03 DIAGNOSIS — R52 Pain, unspecified: Secondary | ICD-10-CM | POA: Insufficient documentation

## 2018-03-03 DIAGNOSIS — R0600 Dyspnea, unspecified: Secondary | ICD-10-CM | POA: Insufficient documentation

## 2018-03-03 DIAGNOSIS — R509 Fever, unspecified: Secondary | ICD-10-CM | POA: Insufficient documentation

## 2018-03-03 LAB — RENAL FUNCTION PANEL
Albumin: 3.1 g/dL — ABNORMAL LOW (ref 3.5–5.0)
Anion gap: 11 (ref 5–15)
BUN: 35 mg/dL — ABNORMAL HIGH (ref 6–20)
CHLORIDE: 107 mmol/L (ref 98–111)
CO2: 21 mmol/L — ABNORMAL LOW (ref 22–32)
Calcium: 9.7 mg/dL (ref 8.9–10.3)
Creatinine, Ser: 7.65 mg/dL — ABNORMAL HIGH (ref 0.44–1.00)
GFR calc Af Amer: 7 mL/min — ABNORMAL LOW (ref >60)
GFR calc non Af Amer: 6 mL/min — ABNORMAL LOW (ref >60)
Glucose, Bld: 127 mg/dL — ABNORMAL HIGH (ref 70–99)
POTASSIUM: 4.1 mmol/L (ref 3.5–5.1)
Phosphorus: 4.1 mg/dL (ref 2.5–4.6)
Sodium: 139 mmol/L (ref 135–145)

## 2018-03-03 LAB — CBC
HEMATOCRIT: 26.6 % — AB (ref 36.0–46.0)
HEMOGLOBIN: 7.9 g/dL — AB (ref 12.0–15.0)
MCH: 27 pg (ref 26.0–34.0)
MCHC: 29.7 g/dL — ABNORMAL LOW (ref 30.0–36.0)
MCV: 90.8 fL (ref 80.0–100.0)
Platelets: 207 10*3/uL (ref 150–400)
RBC: 2.93 MIL/uL — ABNORMAL LOW (ref 3.87–5.11)
RDW: 15.7 % — ABNORMAL HIGH (ref 11.5–15.5)
WBC: 11.1 10*3/uL — ABNORMAL HIGH (ref 4.0–10.5)
nRBC: 0 % (ref 0.0–0.2)

## 2018-03-03 LAB — ANA: Anti Nuclear Antibody(ANA): NEGATIVE

## 2018-03-03 MED ORDER — HEPARIN SODIUM (PORCINE) 1000 UNIT/ML IJ SOLN
INTRAMUSCULAR | Status: AC
  Filled 2018-03-03: qty 4

## 2018-03-03 MED ORDER — SODIUM CHLORIDE 0.9 % IV SOLN: 100.0000 mL | INTRAVENOUS | Status: DC | PRN

## 2018-03-03 MED ORDER — LIDOCAINE HCL (PF) 1 % IJ SOLN: 5.0000 mL | INTRAMUSCULAR | Status: DC | PRN

## 2018-03-03 MED ORDER — ALTEPLASE 2 MG IJ SOLR: 2.0000 mg | Freq: Once | INTRAMUSCULAR | Status: DC | PRN

## 2018-03-03 MED ORDER — HEPARIN SODIUM (PORCINE) 1000 UNIT/ML DIALYSIS
1000.0000 [IU] | INTRAMUSCULAR | Status: DC | PRN
  Administered 2018-03-03: 3200 [IU] via INTRAVENOUS_CENTRAL
  Filled 2018-03-03: qty 1

## 2018-03-03 MED ORDER — VITAMIN D (ERGOCALCIFEROL) 1.25 MG (50000 UNIT) PO CAPS: 50000.0000 [IU] | ORAL_CAPSULE | ORAL | 0 refills | Status: DC

## 2018-03-03 MED ORDER — PANTOPRAZOLE SODIUM 40 MG PO TBEC: 40.0000 mg | DELAYED_RELEASE_TABLET | Freq: Every day | ORAL | 0 refills | Status: DC

## 2018-03-03 MED ORDER — FERRIC CITRATE 1 GM 210 MG(FE) PO TABS: 630.0000 mg | ORAL_TABLET | Freq: Three times a day (TID) | ORAL | 0 refills | Status: DC

## 2018-03-03 MED ORDER — PENTAFLUOROPROP-TETRAFLUOROETH EX AERO: 1.0000 "application " | INHALATION_SPRAY | CUTANEOUS | Status: DC | PRN

## 2018-03-03 MED ORDER — LIDOCAINE-PRILOCAINE 2.5-2.5 % EX CREA: 1.0000 "application " | TOPICAL_CREAM | CUTANEOUS | Status: DC | PRN

## 2018-03-03 NOTE — Progress Notes (Signed)
[facility] NEPHROLOGY PROGRESS NOTE  Assessment/ Plan: Pt is a 43 y.o. yo female with past medical history of hypertension for about 3 years, supposed to be on HCTZ but not taking, menorrhagia, recently diagnosed hypertensive retinopathy by her ophthalmologist, referred by PCP to the ER for the management of severe anemia and elevated serum creatinine level found on routine lab.   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  Ultrasound kidneys with bilateral atrophic kidney and increased cortical echogenicity, labs consistent with Advanced CKD. She does have hypertensive retinopathy. Urinalysis with no RBC, no WBC, PCR 3.2.  Patient is nonoliguric.    -complements, HepBsAg, HIV, SPEP negative.  -Follow up hepatitis C antibody,  ANA, ANCA (were sent from clinic) - TDC + s/p RUE AVF yesterday - good thrill and bruit with good radial pulse, doubt steal - HD today then D/C -CLIP confirmed outpt tx. MWF High pt on Monday - 12:30.    #Anemia due to acute blood loss and CKD: Received 2 units of red blood cell transfusion.  Given history of menorrhagia I recommend GYN consult/pelvic ultrasound.   -IV iron and Aranesp. Was not receiving iron so ordered 2/12 and increased aranesp dose due to downtrending Hb in the 8s.   #Secondary hyperparathyroidism: PTH 540, high phosphorus.  Started Lorin Picket.  Phos improved to 4.8. Calcitriol.  #History of hypertension: Monitor blood pressure.  #dispo: d/c today  ADDENDUM: D/C home.   Subjective: Seen and examined on HD.  Overall feeling quite well.  Ready for d/c today.  C/o some 'tingling' in right 2nd and 3rd digits post op AVF surgery yesterday.   Objective Vital signs in last 24 hours: Vitals:   03/03/18 0429 03/03/18 0640 03/03/18 0656 03/03/18 0716  BP: 122/77 (!) 143/83 136/82 133/81  Pulse: 83 84 77 74  Resp: 18 (!) 25 (!) 23 (!) 22  Temp: 98.3 F  (36.8 C) (!) 97 F (36.1 C)    TempSrc: Oral Oral    SpO2: 98% 97%    Weight: 70.9 kg 71.1 kg    Height:       Weight change: -0.003 kg  Intake/Output Summary (Last 24 hours) at 03/03/2018 0803 Last data filed at 03/03/2018 0433 Gross per 24 hour  Intake 540 ml  Output 1300 ml  Net -760 ml       Labs: Basic Metabolic Panel: Recent Labs  Lab 02/26/18 0450 02/27/18 2222 03/01/18 0408 03/01/18 1846 03/03/18 0703  NA 140 136 137 138 139  K 3.9 3.4* 4.4 3.7 4.1  CL 107 102 102 98 107  CO2 17* 22 22 28  21*  GLUCOSE 92 124* 94 186* 127*  BUN 66* 53* 37* 11 35*  CREATININE 8.92* 7.73* 6.32* 3.62* 7.65*  CALCIUM 9.6 9.1 10.1 9.3 9.7  PHOS 6.8* 4.8*  --   --  4.1   Liver Function Tests: Recent Labs  Lab 02/27/18 2222 03/01/18 1846 03/03/18 0703  AST  --  27  --   ALT  --  10  --   ALKPHOS  --  59  --   BILITOT  --  0.6  --   PROT  --  7.0  --   ALBUMIN 3.0* 3.4* 3.1*   Recent Labs  Lab 03/01/18 1846  LIPASE 63*   No results for input(s): AMMONIA in the last 168 hours. CBC: Recent Labs  Lab 02/27/18 2222 03/01/18 0408 03/01/18 1846 03/02/18  0505 03/03/18 0703  WBC 10.3 8.6 12.4* 13.9* 11.1*  NEUTROABS  --   --  10.4* 10.4*  --   HGB 8.2* 8.2* 8.5* 9.4* 7.9*  HCT 25.6* 26.6* 26.6* 30.8* 26.6*  MCV 86.5 87.8 87.2 88.5 90.8  PLT 234 206 208 268 207   Cardiac Enzymes: No results for input(s): CKTOTAL, CKMB, CKMBINDEX, TROPONINI in the last 168 hours. CBG: No results for input(s): GLUCAP in the last 168 hours.  Iron Studies:  No results for input(s): IRON, TIBC, TRANSFERRIN, FERRITIN in the last 72 hours. Studies/Results: Dg Abd Acute 2+v W 1v Chest  Result Date: 03/01/2018 CLINICAL DATA:  43 year old female with constipation and right upper quadrant pain EXAM: DG ABDOMEN ACUTE W/ 1V CHEST COMPARISON:  None. FINDINGS: Chest: Cardiomediastinal silhouette within normal limits. No evidence of central vascular congestion. No pneumothorax or pleural  effusion. No confluent airspace disease. Right IJ tunneled HD catheter. Abdominal: Surgical changes of cholecystectomy. Gas within small bowel and colon. No abnormal distention. No large stool burden. No unexpected radiopaque foreign body. Pelvic phleboliths. No displaced fracture. IMPRESSION: Chest: No radiographic evidence of acute cardiopulmonary disease. Right IJ tunneled hemodialysis catheter. Abdomen: Nonobstructive bowel gas pattern with no significant stool burden. Electronically Signed   By: Corrie Mckusick D.O.   On: 03/01/2018 19:50    Medications: Infusions: . sodium chloride 10 mL/hr at 03/02/18 0830  . sodium chloride    . sodium chloride    . ferric gluconate (FERRLECIT/NULECIT) IV 125 mg (03/01/18 1438)    Scheduled Medications: . calcitRIOL  0.25 mcg Oral Daily  . Chlorhexidine Gluconate Cloth  6 each Topical Q0600  . Chlorhexidine Gluconate Cloth  6 each Topical Q0600  . darbepoetin (ARANESP) injection - NON-DIALYSIS  100 mcg Subcutaneous Q Thu-1800  . ferric citrate  630 mg Oral TID WC  . pantoprazole  40 mg Oral BID AC  . Vitamin D (Ergocalciferol)  50,000 Units Oral Q7 days    have reviewed scheduled and prn medications.  Physical Exam: General: Not in distress, comfortable Heart:RRR, s1s2 nl no rubs Lungs: Clear bilateral, no wheezing or crackles Abdomen:soft, Non-tender, non-distended Extremities: No edema Neurology: Alert, awake Skin: No rashes Psych: pleasant affect  Justin Mend 03/03/2018,8:03 AM  LOS: 9 days

## 2018-03-06 ENCOUNTER — Encounter (HOSPITAL_COMMUNITY): Payer: Self-pay | Admitting: Vascular Surgery

## 2018-03-06 DIAGNOSIS — D689 Coagulation defect, unspecified: Secondary | ICD-10-CM | POA: Insufficient documentation

## 2018-03-06 NOTE — Discharge Summary (Signed)
[facility] Discharge Summary   Patient: Sara Meza YTK:[id number]   PCP: [name], [place] DOB: 11/08/1975   Date of admission: 02/22/2018   Date of discharge: 03/03/2018    Discharge Diagnoses:   Principal Problem:   CKD (chronic kidney disease), stage V ([place]) Active Problems:   Essential hypertension   Iron deficiency   Retinopathy   Renal failure   Admitted From: home Disposition:  home  Recommendations for Outpatient Follow-up:  1. Please follow-up with PCP in 1 week  Follow-up Information    [name], PA-C. Schedule an appointment as soon as possible for a visit in 1 week(s).   Specialty:  Physician Assistant Contact information: [address] [place] [address] [phone]        Waynetta Sandy, MD Follow up in 5 week(s).   Specialties:  Vascular Surgery, Cardiology Contact information: [address] [place] [address] [phone]          Diet recommendation: Renal diet  Activity: The patient is advised to gradually reintroduce usual activities.  Discharge Condition: good  Code Status: Full code  History of present illness: As per the H and P dictated on admission, "Sara Meza is a 43 y.o. female with medical history significant of hypertension with otherwise healthy who presents to the hospital with chief complaint of abnormal labs as well as weakness over the last month. Patient tells me that she has had an upper respiratory infection about a month ago and generalized weakness then, it appears to have resolved in a few days but she has had persistent weakness, fatigue and increased sleepiness over the last month.  She also reports intermittent nausea and several episodes of emesis.  Also reports loss of appetite.  She denies any fever or chills, denies any chest pain, denies any shortness of breath.  No abdominal pain, no diarrhea.  She was evaluated by PCP with routine labs and  was found to have a hemoglobin of 5.7 as well as a creatinine in the 10 range and was sent to the emergency room.  Patient states that she usually has heavy menses but denies any blood in the stool or dark tarry stools.  She reports bilateral lower extremity swelling more so in the last week, and feels like her skin overlying the swelling has turned a little bit red.  Also reports that the right lower extremity seems to be more swollen than the left.  She reports taking ibuprofen rarely, denies any other NSAIDs."  Hospital Course:  Summary of her active problems in the hospital is as following. 1. 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 Nephrology input appreciated, was initiated on HD on 02/24/18 had bilateral upper extremity vein mapping on 02/27/2018, permanent hemodialysis access placement by vascular surgery on 03/02/18, treated withbicarb for non-anion gap metabolic acidosis renal ultrasound with bil atrophic kidneys,  Calcitriol And Auryxia,  protein electrophoresis is unremarkable, hepatitis B is negative,  hepatitis C is pending, complement testing reveals no complement deficiency, ANA negative and ANCA titers are pending Per nephrologist kidney biopsy may not change management or outcome UA with glucosuria, hematuria and proteinuria Outpatient hemodialysis arranged, clip completed  2. HTN history of hypertensiveretinopathy, stable at this time,  PTA was noncompliant with antihypertensives,  nephrologist advises holding off on anti-hypertensives at this time  3.  Acute on chronic anemia of CKD patient with iron deficiency anemia secondary to menorrhagia and also anemia of chronic kidney disease. Hemoglobin is up to  8.57fom 5.7 after transfusion of 2 units on 02/22/18  Aranesp and iron infusion by nephrologist  4. Secondary Hyperparathyroidism patient with PTH of 540 and phosphorouswas9.4, trending down, c/n calcitriol  5. Vitamin D deficiency vitamin D is low at 12, replace  with 50,000 units weekly  6.  Abdominal pain with cramping and diarrhea. X-ray abdomen unremarkable, CBC and CMP stable.  Lipase normal.  Symptoms resolved.   Patient was ambulatory without any assistance. On the day of the discharge the patient's vitals were stable , and no other acute medical condition were reported by patient. the patient was felt safe to be discharge at home with family.  Consultants: Nephrology, vascular surgery Procedures: Hemodialysis AV fistula placement by vascular surgery IR guided TDC placement   DISCHARGE MEDICATION: Allergies as of 03/03/2018      Reactions   Lisinopril Other (See Comments)   irregular menses      Medication List    STOP taking these medications   doxycycline 100 MG tablet Commonly known as:  VIBRA-TABS   hydrochlorothiazide 12.5 MG tablet Commonly known as:  HYDRODIURIL   ibuprofen 200 MG tablet Commonly known as:  ADVIL,MOTRIN   medroxyPROGESTERone 10 MG tablet Commonly known as:  PROVERA     TAKE these medications   BIOTIN PO Take 2 tablets by mouth daily.   Chlorphen-Phenyleph-APAP 2-5-325 MG Tabs Take 2 tablets by mouth daily as needed (cough, headache, congestion).   ferric citrate 1 GM 210 MG(Fe) tablet Commonly known as:  AURYXIA Take 3 tablets (630 mg total) by mouth 3 (three) times daily with meals.   pantoprazole 40 MG tablet Commonly known as:  PROTONIX Take 1 tablet (40 mg total) by mouth daily before breakfast for 14 days.   VICKS VAPOR INHALER IN Inhale 2 puffs into the lungs daily as needed (stuffy nose).   Vitamin D (Ergocalciferol) 1.25 MG (50000 UT) Caps capsule Commonly known as:  DRISDOL Take 1 capsule (50,000 Units total) by mouth every 7 (seven) days. Start taking on:  March 10, 2018      Allergies  Allergen Reactions  . Lisinopril Other (See Comments)    irregular menses   Discharge Instructions    Diet - low sodium heart healthy   Complete by:  As directed    Discharge  instructions   Complete by:  As directed    It is important that you read the given instructions as well as go over your medication list with RN to help you understand your care after this hospitalization.  Discharge Instructions: Please follow-up with PCP in 1-2 weeks  Please request your primary care physician to go over all Hospital Tests and Procedure/Radiological results at the follow up. Please get all Hospital records sent to your PCP by signing hospital release before you go home.   Do not take more than prescribed Pain, Sleep and Anxiety Medications. You were cared for by a hospitalist during your hospital stay. If you have any questions about your discharge medications or the care you received while you were in the hospital after you are discharged, you can call the unit '@UNIT' @ you were admitted to and ask to speak with the hospitalist on call if the hospitalist that took care of you is not available.  Once you are discharged, your primary care physician will handle any further medical issues. Please note that NO REFILLS for any discharge medications will be authorized once you are discharged, as it is imperative that you return to your primary  care physician (or establish a relationship with a primary care physician if you do not have one) for your aftercare needs so that they can reassess your need for medications and monitor your lab values. You Must read complete instructions/literature along with all the possible adverse reactions/side effects for all the Medicines you take and that have been prescribed to you. Take any new Medicines after you have completely understood and accept all the possible adverse reactions/side effects. Wear Seat belts while driving. If you have smoked or chewed Tobacco in the last 2 yrs please stop smoking and/or stop any Recreational drug use.  If you drink alcohol, please moderate the use and do not drive, operating heavy machinery, perform activities at  heights, swimming or participation in water activities or provide baby sitting services under influence.   Increase activity slowly   Complete by:  As directed      Discharge Exam: Filed Weights   03/03/18 0429 03/03/18 0640 03/03/18 1026  Weight: 70.9 kg 71.1 kg 71.3 kg   Vitals:   03/03/18 1026 03/03/18 1110  BP: 112/69 120/78  Pulse: 80 82  Resp: (!) 25 18  Temp: (!) 97 F (36.1 C)   SpO2: 100%    General: Appear in no distress, no Rash; Oral Mucosa moist. Cardiovascular: S1 and S2 Present, no Murmur, no JVD Respiratory: Bilateral Air entry present and Clear to Auscultation, no Crackles, no wheezes Abdomen: Bowel Sound present, Soft and no tenderness Extremities: no Pedal edema, no calf tenderness Neurology: Grossly no focal neuro deficit.  The results of significant diagnostics from this hospitalization (including imaging, microbiology, ancillary and laboratory) are listed below for reference.    Significant Diagnostic Studies: US Renal  Result Date: 02/22/2018 CLINICAL DATA:  Acute renal failure EXAM: RENAL / URINARY TRACT ULTRASOUND COMPLETE COMPARISON:  None. FINDINGS: Right Kidney: Renal measurements: 7.35 x 3.06 x 2.94 cm = volume: 34.62 mL. There is a tiny amount of perinephric fluid adjacent to the superior pole. Diffuse increased cortical echogenicity. Left Kidney: Renal measurements: 8.68 x 3.32 x 3.26 cm = volume: 49.19 mL. Minimal fullness of the collecting system. No gross hydronephrosis. Bladder: Appears normal for degree of bladder distention. IMPRESSION: 1. A small echogenic kidneys consistent with medical renal disease. 2. A tiny amount of fluid adjacent to the superior pole the right kidney is age indeterminate nonspecific. 3. Minimal fullness in the left collecting system of doubtful acute significance. Electronically Signed   By: Dorise Bullion III M.D   On: 02/22/2018 11:57   Ir Fluoro Guide Cv Line Right  Result Date: 02/24/2018 INDICATION: 43 year old  female with a history of renal failure EXAM: TUNNELED CENTRAL VENOUS HEMODIALYSIS CATHETER PLACEMENT WITH ULTRASOUND AND FLUOROSCOPIC GUIDANCE MEDICATIONS: 2.0 g Ancef. The antibiotic was given in an appropriate time interval prior to skin puncture. ANESTHESIA/SEDATION: Moderate (conscious) sedation was employed during this procedure. A total of Versed 1.0 mg and Fentanyl 75 mcg was administered intravenously. Moderate Sedation Time: 17 minutes. The patient's level of consciousness and vital signs were monitored continuously by radiology nursing throughout the procedure under my direct supervision. FLUOROSCOPY TIME:  Fluoroscopy Time: 0 minutes 12 seconds (0.5 mGy). COMPLICATIONS: None PROCEDURE: Informed written consent was obtained from the patient after a discussion of the risks, benefits, and alternatives to treatment. Questions regarding the procedure were encouraged and answered. The right neck and chest were prepped with chlorhexidine in a sterile fashion, and a sterile drape was applied covering the operative field. Maximum barrier sterile technique with sterile  gowns and gloves were used for the procedure. A timeout was performed prior to the initiation of the procedure. After creating a small venotomy incision, a micropuncture kit was utilized to access the right internal jugular vein under direct, real-time ultrasound guidance after the overlying soft tissues were anesthetized with 1% lidocaine with epinephrine. Ultrasound image documentation was performed. The microwire was marked to measure appropriate internal catheter length. External tunneled length was estimated. A total tip to cuff length of 19 cm was selected. Skin and subcutaneous tissues of chest wall below the clavicle were generously infiltrated with 1% lidocaine for local anesthesia. A small stab incision was made with 11 blade scalpel. The selected hemodialysis catheter was tunneled in a retrograde fashion from the anterior chest wall to the  venotomy incision. A guidewire was advanced to the level of the IVC and the micropuncture sheath was exchanged for a peel-away sheath. The catheter was then placed through the peel-away sheath with tips ultimately positioned within the superior aspect of the right atrium. Final catheter positioning was confirmed and documented with a spot radiographic image. The catheter aspirates and flushes normally. The catheter was flushed with appropriate volume heparin dwells. The catheter exit site was secured with a 0-Prolene retention suture. The venotomy incision was closed Derma bond and sterile dressing. Dressings were applied at the chest wall. Patient tolerated the procedure well and remained hemodynamically stable throughout. No complications were encountered and no significant blood loss encountered. IMPRESSION: Status post right IJ tunneled hemodialysis catheter placement. Catheter ready for use. Signed, Dulcy Fanny. Dellia Nims, RPVI [facility] Radiology Electronically Signed   By: Corrie Mckusick D.O.   On: 02/24/2018 16:28   Ir US Guide Vasc Access Right  Result Date: 02/24/2018 INDICATION: 43 year old female with a history of renal failure EXAM: TUNNELED CENTRAL VENOUS HEMODIALYSIS CATHETER PLACEMENT WITH ULTRASOUND AND FLUOROSCOPIC GUIDANCE MEDICATIONS: 2.0 g Ancef. The antibiotic was given in an appropriate time interval prior to skin puncture. ANESTHESIA/SEDATION: Moderate (conscious) sedation was employed during this procedure. A total of Versed 1.0 mg and Fentanyl 75 mcg was administered intravenously. Moderate Sedation Time: 17 minutes. The patient's level of consciousness and vital signs were monitored continuously by radiology nursing throughout the procedure under my direct supervision. FLUOROSCOPY TIME:  Fluoroscopy Time: 0 minutes 12 seconds (0.5 mGy). COMPLICATIONS: None PROCEDURE: Informed written consent was obtained from the patient after a discussion  of the risks, benefits, and alternatives to treatment. Questions regarding the procedure were encouraged and answered. The right neck and chest were prepped with chlorhexidine in a sterile fashion, and a sterile drape was applied covering the operative field. Maximum barrier sterile technique with sterile gowns and gloves were used for the procedure. A timeout was performed prior to the initiation of the procedure. After creating a small venotomy incision, a micropuncture kit was utilized to access the right internal jugular vein under direct, real-time ultrasound guidance after the overlying soft tissues were anesthetized with 1% lidocaine with epinephrine. Ultrasound image documentation was performed. The microwire was marked to measure appropriate internal catheter length. External tunneled length was estimated. A total tip to cuff length of 19 cm was selected. Skin and subcutaneous tissues of chest wall below the clavicle were generously infiltrated with 1% lidocaine for local anesthesia. A small stab incision was made with 11 blade scalpel. The selected hemodialysis catheter was tunneled in a retrograde fashion from the anterior chest wall to the venotomy incision. A guidewire was advanced to the level of  the IVC and the micropuncture sheath was exchanged for a peel-away sheath. The catheter was then placed through the peel-away sheath with tips ultimately positioned within the superior aspect of the right atrium. Final catheter positioning was confirmed and documented with a spot radiographic image. The catheter aspirates and flushes normally. The catheter was flushed with appropriate volume heparin dwells. The catheter exit site was secured with a 0-Prolene retention suture. The venotomy incision was closed Derma bond and sterile dressing. Dressings were applied at the chest wall. Patient tolerated the procedure well and remained hemodynamically stable throughout. No complications were encountered and no  significant blood loss encountered. IMPRESSION: Status post right IJ tunneled hemodialysis catheter placement. Catheter ready for use. Signed, Dulcy Fanny. Dellia Nims, RPVI [facility] Radiology Electronically Signed   By: Corrie Mckusick D.O.   On: 02/24/2018 16:28   Dg Abd Acute 2+v W 1v Chest  Result Date: 03/01/2018 CLINICAL DATA:  43 year old female with constipation and right upper quadrant pain EXAM: DG ABDOMEN ACUTE W/ 1V CHEST COMPARISON:  None. FINDINGS: Chest: Cardiomediastinal silhouette within normal limits. No evidence of central vascular congestion. No pneumothorax or pleural effusion. No confluent airspace disease. Right IJ tunneled HD catheter. Abdominal: Surgical changes of cholecystectomy. Gas within small bowel and colon. No abnormal distention. No large stool burden. No unexpected radiopaque foreign body. Pelvic phleboliths. No displaced fracture. IMPRESSION: Chest: No radiographic evidence of acute cardiopulmonary disease. Right IJ tunneled hemodialysis catheter. Abdomen: Nonobstructive bowel gas pattern with no significant stool burden. Electronically Signed   By: Corrie Mckusick D.O.   On: 03/01/2018 19:50   Vas [place] Lower Extremity Venous (dvt)  Result Date: 02/23/2018  Lower Venous Study Indications: Swelling.  Performing Technologist: Oliver Hum RVT  Examination Guidelines: A complete evaluation includes B-mode imaging, spectral Doppler, color Doppler, and power Doppler as needed of all accessible portions of each vessel. Bilateral testing is considered an integral part of a complete examination. Limited examinations for reoccurring indications may be performed as noted.  Right Venous Findings: +---------+---------------+---------+-----------+----------+-------+          CompressibilityPhasicitySpontaneityPropertiesSummary +---------+---------------+---------+-----------+----------+-------+ CFV      Full           Yes      Yes                           +---------+---------------+---------+-----------+----------+-------+ SFJ      Full                                                 +---------+---------------+---------+-----------+----------+-------+ FV Prox  Full                                                 +---------+---------------+---------+-----------+----------+-------+ FV Mid   Full                                                 +---------+---------------+---------+-----------+----------+-------+ FV DistalFull                                                 +---------+---------------+---------+-----------+----------+-------+  PFV      Full                                                 +---------+---------------+---------+-----------+----------+-------+ POP      Full           Yes      Yes                          +---------+---------------+---------+-----------+----------+-------+ PTV      Full                                                 +---------+---------------+---------+-----------+----------+-------+ PERO     Full                                                 +---------+---------------+---------+-----------+----------+-------+  Left Venous Findings: +---------+---------------+---------+-----------+----------+-------+          CompressibilityPhasicitySpontaneityPropertiesSummary +---------+---------------+---------+-----------+----------+-------+ CFV      Full           Yes      Yes                          +---------+---------------+---------+-----------+----------+-------+ SFJ      Full                                                 +---------+---------------+---------+-----------+----------+-------+ FV Prox  Full                                                 +---------+---------------+---------+-----------+----------+-------+ FV Mid   Full                                                  +---------+---------------+---------+-----------+----------+-------+ FV DistalFull                                                 +---------+---------------+---------+-----------+----------+-------+ PFV      Full                                                 +---------+---------------+---------+-----------+----------+-------+ POP      Full           Yes      Yes                          +---------+---------------+---------+-----------+----------+-------+  PTV      Full                                                 +---------+---------------+---------+-----------+----------+-------+ PERO     Full                                                 +---------+---------------+---------+-----------+----------+-------+    Summary: Right: There is no evidence of deep vein thrombosis in the lower extremity. No cystic structure found in the popliteal fossa. Left: There is no evidence of deep vein thrombosis in the lower extremity. No cystic structure found in the popliteal fossa.  *See table(s) above for measurements and observations. Electronically signed by Servando Snare MD on 02/23/2018 at 1:43:48 PM.    Final    Vas [place] Upper Ext Vein Mapping (pre-op Avf)  Result Date: 02/27/2018 UPPER EXTREMITY VEIN MAPPING  Indications: Pre-op dialysis access. Performing Technologist: Sharion Dove RVS  Examination Guidelines: A complete evaluation includes B-mode imaging, spectral Doppler, color Doppler, and power Doppler as needed of all accessible portions of each vessel. Bilateral testing is considered an integral part of a complete examination. Limited examinations for reoccurring indications may be performed as noted. +-----------------+-------------+----------+--------------------------------+ Right Cephalic   Diameter (cm)Depth (cm)            Findings             +-----------------+-------------+----------+--------------------------------+ Prox upper arm       0.43        0.69                                     +-----------------+-------------+----------+--------------------------------+ Mid upper arm        0.37        0.57              branching             +-----------------+-------------+----------+--------------------------------+ Dist upper arm       0.43        0.32                                    +-----------------+-------------+----------+--------------------------------+ Antecubital fossa    0.50        0.31              branching             +-----------------+-------------+----------+--------------------------------+ Prox forearm         0.41        0.57              branching             +-----------------+-------------+----------+--------------------------------+ Mid forearm          0.42        0.65                                    +-----------------+-------------+----------+--------------------------------+ Wrist  0.36        0.50   branching and thrombus in branch +-----------------+-------------+----------+--------------------------------+ +-----------------+-------------+----------+---------+ Left Cephalic    Diameter (cm)Depth (cm)Findings  +-----------------+-------------+----------+---------+ Prox upper arm       0.52        0.88             +-----------------+-------------+----------+---------+ Mid upper arm        0.50        0.79             +-----------------+-------------+----------+---------+ Dist upper arm       0.29        0.35   branching +-----------------+-------------+----------+---------+ Antecubital fossa    0.62        0.34             +-----------------+-------------+----------+---------+ Prox forearm         0.50        0.79   branching +-----------------+-------------+----------+---------+ Mid forearm          0.40        0.63             +-----------------+-------------+----------+---------+ Wrist                0.41        0.86   branching  +-----------------+-------------+----------+---------+ *See table(s) above for measurements and observations.  Diagnosing physician:    Preliminary     Microbiology: No results found for this or any previous visit (from the past 240 hour(s)).   Labs: CBC: Recent Labs  Lab 02/27/18 2222 03/01/18 0408 03/01/18 1846 03/02/18 0505 03/03/18 0703  WBC 10.3 8.6 12.4* 13.9* 11.1*  NEUTROABS  --   --  10.4* 10.4*  --   HGB 8.2* 8.2* 8.5* 9.4* 7.9*  HCT 25.6* 26.6* 26.6* 30.8* 26.6*  MCV 86.5 87.8 87.2 88.5 90.8  PLT 234 206 208 268 765   Basic Metabolic Panel: Recent Labs  Lab 02/27/18 2222 03/01/18 0408 03/01/18 1846 03/03/18 0703  NA 136 137 138 139  K 3.4* 4.4 3.7 4.1  CL 102 102 98 107  CO2 '22 22 28 ' 21*  GLUCOSE 124* 94 186* 127*  BUN 53* 37* 11 35*  CREATININE 7.73* 6.32* 3.62* 7.65*  CALCIUM 9.1 10.1 9.3 9.7  PHOS 4.8*  --   --  4.1   Liver Function Tests: Recent Labs  Lab 02/27/18 2222 03/01/18 1846 03/03/18 0703  AST  --  27  --   ALT  --  10  --   ALKPHOS  --  59  --   BILITOT  --  0.6  --   PROT  --  7.0  --   ALBUMIN 3.0* 3.4* 3.1*   Recent Labs  Lab 03/01/18 1846  LIPASE 63*   No results for input(s): AMMONIA in the last 168 hours. Cardiac Enzymes: No results for input(s): CKTOTAL, CKMB, CKMBINDEX, TROPONINI in the last 168 hours. BNP (last 3 results) No results for input(s): BNP in the last 8760 hours. CBG: No results for input(s): GLUCAP in the last 168 hours. Time spent: 35 minutes  Signed:  Berle Mull  [facility] 03/03/2018

## 2018-03-07 ENCOUNTER — Encounter: Payer: Self-pay | Admitting: Physician Assistant

## 2018-03-07 ENCOUNTER — Ambulatory Visit (INDEPENDENT_AMBULATORY_CARE_PROVIDER_SITE_OTHER): Payer: Medicaid Other | Admitting: Physician Assistant

## 2018-03-07 VITALS — BP 122/76 | HR 86 | Temp 98.3°F | Wt 161.0 lb

## 2018-03-07 DIAGNOSIS — D638 Anemia in other chronic diseases classified elsewhere: Secondary | ICD-10-CM

## 2018-03-07 DIAGNOSIS — E559 Vitamin D deficiency, unspecified: Secondary | ICD-10-CM | POA: Diagnosis not present

## 2018-03-07 DIAGNOSIS — Z992 Dependence on renal dialysis: Secondary | ICD-10-CM

## 2018-03-07 DIAGNOSIS — H35039 Hypertensive retinopathy, unspecified eye: Secondary | ICD-10-CM

## 2018-03-07 DIAGNOSIS — Z09 Encounter for follow-up examination after completed treatment for conditions other than malignant neoplasm: Secondary | ICD-10-CM | POA: Diagnosis not present

## 2018-03-07 DIAGNOSIS — N186 End stage renal disease: Secondary | ICD-10-CM

## 2018-03-07 DIAGNOSIS — N2581 Secondary hyperparathyroidism of renal origin: Secondary | ICD-10-CM

## 2018-03-07 NOTE — Progress Notes (Signed)
HPI:                                                                Sara Meza is a 43 y.o. female who presents to [place]: [facility] today for hospital discharge follow-up  Pleasant 43 yo F with new diagnosis of ESRD, hypertensive retinopathy, anemia, vitamin D deficiency presents for hospital follow-up. She spent 10 days in [facility]. She underwent multiple red blood cell transfusions and was started on dialysis. She had AVF surgery on 03/02/18. Doing well. Reports she is unable to fully extend her right arm and is still getting used to using her left arm for everything. Denies pain, warmth or redness of the right arm.  She was discharged on Auryxia and Vitamin D. Unfortunately she had to pay over $1000 for 92-month supply of Auryxia.   Hospital Course:  Summary of her active problems in the hospital is as following. 1. 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 Nephrology input appreciated, was initiated on HD on 02/24/18 had bilateral upper extremity vein mapping on 02/27/2018, permanent hemodialysis access placement by vascular surgery on 03/02/18, treated withbicarb for non-anion gap metabolic acidosis renal ultrasound with bil atrophic kidneys,  Calcitriol And Auryxia,  protein electrophoresis is unremarkable, hepatitis B is negative,  hepatitis C is pending, complement testing reveals no complement deficiency, ANA negative and ANCA titers are pending Per nephrologist kidney biopsy may not change management or outcome UA with glucosuria, hematuria and proteinuria Outpatient hemodialysis arranged, clip completed  2. HTN history of hypertensiveretinopathy, stable at this time,  PTA was noncompliant with antihypertensives,  nephrologist advises holding off on anti-hypertensives at this time  3. Acute on chronic anemia of CKD patient with iron deficiency anemia secondary to menorrhagia and also anemia of chronic kidney  disease. Hemoglobin is up to 8.37from 5.7 after transfusion of 2 units on 02/22/18  Aranesp and iron infusion by nephrologist  4. Secondary Hyperparathyroidism patient with PTH of 540 and phosphorouswas9.4, trending down, c/n calcitriol  5. Vitamin D deficiency vitamin D is low at 12, replace with 50,000 units weekly  Past Medical History:  Diagnosis Date  . History of gestational diabetes 06/29/2012  . Hypertension    Past Surgical History:  Procedure Laterality Date  . AV FISTULA PLACEMENT Right 03/02/2018   Procedure: ARTERIOVENOUS (AV) FISTULA CREATION;  Surgeon: Waynetta Sandy, MD;  Location: [place];  Service: Vascular;  Laterality: Right;  . ENDOVENOUS ABLATION SAPHENOUS VEIN W/ LASER    . IR FLUORO GUIDE CV LINE RIGHT  02/24/2018  . IR US GUIDE VASC ACCESS RIGHT  02/24/2018  . TUBAL LIGATION     Social History   Tobacco Use  . Smoking status: Never Smoker  . Smokeless tobacco: Never Used  Substance Use Topics  . Alcohol use: Not Currently   family history includes Colon cancer in her sister; Diabetes in her father and mother; Hypertension in her father and mother; Wilm's tumor in her sister.    ROS: negative except as noted in the HPI  Medications: Current Outpatient Medications  Medication Sig Dispense Refill  . Chlorphen-Phenyleph-APAP 2-5-325 MG TABS Take 2 tablets by mouth daily as needed (cough, headache, congestion).    . ferric citrate (AURYXIA) 1 GM 210  MG(Fe) tablet Take 3 tablets (630 mg total) by mouth 3 (three) times daily with meals. 270 tablet 0  . pantoprazole (PROTONIX) 40 MG tablet Take 1 tablet (40 mg total) by mouth daily before breakfast for 14 days. 14 tablet 0  . [START ON 03/10/2018] Vitamin D, Ergocalciferol, (DRISDOL) 1.25 MG (50000 UT) CAPS capsule Take 1 capsule (50,000 Units total) by mouth every 7 (seven) days. 5 capsule 0   No current facility-administered medications for this visit.    Allergies  Allergen Reactions  .  Lisinopril Other (See Comments)    irregular menses       Objective:  BP 122/76   Pulse 86   Temp 98.3 F (36.8 C) (Oral)   Wt 161 lb (73 kg)   BMI 25.99 kg/m  Gen:  alert, not ill-appearing, no distress, appropriate for age HEENT: head normocephalic without obvious abnormality, conjunctiva and cornea clear, trachea midline Pulm: Normal work of breathing, normal phonation, clear to auscultation bilaterally, no wheezes, rales or rhonchi CV: Normal rate, regular rhythm, s1 and s2 distinct, bruit from av fistula radiates across precordium  Neuro: alert and oriented x 3, no tremor MSK: extremities atraumatic, normal gait and station Skin: right forearm incision healing well  Psych: well-groomed, cooperative, good eye contact, euthymic mood, affect mood-congruent, speech is articulate, and thought processes clear and goal-directed  Lab Results  Component Value Date   CREATININE 7.65 (H) 03/03/2018   BUN 35 (H) 03/03/2018   NA 139 03/03/2018   K 4.1 03/03/2018   CL 107 03/03/2018   CO2 21 (L) 03/03/2018   Lab Results  Component Value Date   WBC 11.1 (H) 03/03/2018   HGB 7.9 (L) 03/03/2018   HCT 26.6 (L) 03/03/2018   MCV 90.8 03/03/2018   PLT 207 03/03/2018   Lab Results  Component Value Date   IRON 21 (L) 02/22/2018   TIBC 245 (L) 02/22/2018   FERRITIN 18 02/22/2018     No results found for this or any previous visit (from the past 72 hour(s)). No results found.    Assessment and Plan: 43 y.o. female with   .Anecia was seen today for hospitalization follow-up.  Diagnoses and all orders for this visit:  Hospital discharge follow-up  Vitamin D deficiency  Anemia of chronic disease  ESRD (end stage renal disease) on dialysis ([place])  Hypertensive retinopathy, unspecified laterality  Secondary hyperparathyroidism of renal origin ([place])   Completed forms in office today for patient assistance program for [place]. She will not be able to afford  $1000/month.  She will still need follow-up with GYN for menorrhagia to prevent worsening anemia.  I will reach out to her Nephrologist to coordinate her care.  Patient education and anticipatory guidance given Patient agrees with treatment plan Follow-up as needed if symptoms worsen or fail to improve  Darlyne Russian PA-C

## 2018-03-08 ENCOUNTER — Encounter: Payer: Self-pay | Admitting: Physician Assistant

## 2018-03-08 ENCOUNTER — Telehealth: Payer: Self-pay | Admitting: Physician Assistant

## 2018-03-08 DIAGNOSIS — N186 End stage renal disease: Secondary | ICD-10-CM | POA: Insufficient documentation

## 2018-03-08 DIAGNOSIS — N2581 Secondary hyperparathyroidism of renal origin: Secondary | ICD-10-CM | POA: Insufficient documentation

## 2018-03-08 DIAGNOSIS — Z992 Dependence on renal dialysis: Secondary | ICD-10-CM | POA: Insufficient documentation

## 2018-03-08 DIAGNOSIS — E559 Vitamin D deficiency, unspecified: Secondary | ICD-10-CM | POA: Insufficient documentation

## 2018-03-08 DIAGNOSIS — D638 Anemia in other chronic diseases classified elsewhere: Secondary | ICD-10-CM | POA: Insufficient documentation

## 2018-03-08 HISTORY — DX: End stage renal disease: N18.6

## 2018-03-08 NOTE — Telephone Encounter (Signed)
PCP handed me a form for patient assistance that had been filled out by the patient and it has been faxed. Waiting on a response from [facility].

## 2018-03-15 NOTE — Telephone Encounter (Signed)
I called to check on the patient assistance and spoke with Carloyn Manner at [facility] and he states that the medication was approved and it has been shipped to the patient. Patient assistance has let the patient know. Forms will be sent to scan. PCP is aware as well.

## 2018-03-23 ENCOUNTER — Encounter: Payer: Self-pay | Admitting: Physician Assistant

## 2018-04-06 ENCOUNTER — Encounter: Payer: Self-pay | Admitting: Obstetrics & Gynecology

## 2018-04-13 ENCOUNTER — Encounter (HOSPITAL_COMMUNITY): Payer: Self-pay

## 2018-04-18 ENCOUNTER — Encounter: Payer: Self-pay | Admitting: Family

## 2018-04-18 ENCOUNTER — Inpatient Hospital Stay (HOSPITAL_COMMUNITY): Admission: RE | Admit: 2018-04-18 | Payer: Self-pay | Source: Ambulatory Visit

## 2018-04-19 ENCOUNTER — Other Ambulatory Visit: Payer: Self-pay

## 2018-04-19 ENCOUNTER — Telehealth (HOSPITAL_COMMUNITY): Payer: Self-pay | Admitting: Rehabilitation

## 2018-04-19 DIAGNOSIS — N185 Chronic kidney disease, stage 5: Secondary | ICD-10-CM

## 2018-04-19 NOTE — Telephone Encounter (Signed)
The above patient or their representative was contacted and gave the following answers to these questions:         Do you have any of the following symptoms? No  Fever                    Cough                   Shortness of breath  Do  you have any of the following other symptoms? No   muscle pain         vomiting,        diarrhea        rash         weakness        red eye        abdominal pain         bruising          bruising or bleeding              joint pain           severe headache    Have you been in contact with someone who was or has been sick in the past 2 weeks? No  Yes                 Unsure                         Unable to assess   Does the person that you were in contact with have any of the following symptoms? No  Cough         shortness of breath           muscle pain         vomiting,            diarrhea            rash            weakness           fever            red eye           abdominal pain           bruising  or  bleeding                joint pain                severe headache               Have you  or someone you have been in contact with traveled internationally in the last month? No         If yes, which countries?   Have you  or someone you have been in contact with traveled outside [place] in the last month? No        If yes, which state and city?   COMMENTS OR ACTION PLAN FOR THIS PATIENT:

## 2018-04-20 ENCOUNTER — Ambulatory Visit (INDEPENDENT_AMBULATORY_CARE_PROVIDER_SITE_OTHER): Payer: Self-pay | Admitting: Family

## 2018-04-20 ENCOUNTER — Other Ambulatory Visit: Payer: Self-pay

## 2018-04-20 ENCOUNTER — Encounter: Payer: Self-pay | Admitting: Family

## 2018-04-20 ENCOUNTER — Ambulatory Visit (HOSPITAL_COMMUNITY)
Admission: RE | Admit: 2018-04-20 | Discharge: 2018-04-20 | Disposition: A | Discharge status: Home / Self Care | Payer: Medicaid Other | Source: Ambulatory Visit | Attending: Family | Admitting: Family

## 2018-04-20 VITALS — BP 117/73 | HR 83 | Temp 98.4°F | Resp 16 | Ht 66.0 in | Wt 156.0 lb

## 2018-04-20 DIAGNOSIS — N186 End stage renal disease: Secondary | ICD-10-CM

## 2018-04-20 DIAGNOSIS — I770 Arteriovenous fistula, acquired: Secondary | ICD-10-CM

## 2018-04-20 DIAGNOSIS — Z992 Dependence on renal dialysis: Secondary | ICD-10-CM

## 2018-04-20 DIAGNOSIS — N185 Chronic kidney disease, stage 5: Secondary | ICD-10-CM | POA: Diagnosis present

## 2018-04-20 NOTE — Progress Notes (Signed)
CC: Follow up AVF creation with duplex and exam   History of Present Illness  Sara Meza is a 43 y.o. ([birth year]) female who is s/p right upper arm brachiocephalic AV fistula creation on 03-02-18 by Dr. Donzetta Matters.   This is her first permanent HD access. She currently dialyzes via right IJ TDC on MWF at [facility], on [address].   She is right hand dominant, but the veins in her left arm were not as usable as the right arm.  She denies any steal type symptoms in her right hand.   Past Medical History:  Diagnosis Date  . History of gestational diabetes 06/29/2012  . Hypertension     Social History Social History   Tobacco Use  . Smoking status: Never Smoker  . Smokeless tobacco: Never Used  Substance Use Topics  . Alcohol use: Not Currently  . Drug use: Never    Family History Family History  Problem Relation Age of Onset  . Diabetes Father   . Hypertension Father   . Diabetes Mother   . Hypertension Mother   . Colon cancer Sister   . Wilm's tumor Sister     Surgical History Past Surgical History:  Procedure Laterality Date  . AV FISTULA PLACEMENT Right 03/02/2018   Procedure: ARTERIOVENOUS (AV) FISTULA CREATION;  Surgeon: Waynetta Sandy, MD;  Location: [place];  Service: Vascular;  Laterality: Right;  . ENDOVENOUS ABLATION SAPHENOUS VEIN W/ LASER    . IR FLUORO GUIDE CV LINE RIGHT  02/24/2018  . IR US GUIDE VASC ACCESS RIGHT  02/24/2018  . TUBAL LIGATION      Allergies  Allergen Reactions  . Lisinopril Other (See Comments)    irregular menses    Current Outpatient Medications  Medication Sig Dispense Refill  . ferric citrate (AURYXIA) 1 GM 210 MG(Fe) tablet Take 3 tablets (630 mg total) by mouth 3 (three) times daily with meals. 270 tablet 0  . Vitamin D, Ergocalciferol, (DRISDOL) 1.25 MG (50000 UT) CAPS capsule Take 1 capsule (50,000 Units total) by mouth every 7 (seven) days. 5 capsule 0  . pantoprazole (PROTONIX) 40 MG tablet Take 1 tablet  (40 mg total) by mouth daily before breakfast for 14 days. 14 tablet 0   No current facility-administered medications for this visit.      REVIEW OF SYSTEMS: see HPI for pertinent positives and negatives    PHYSICAL EXAMINATION:  Vitals:   04/20/18 1228  BP: 117/73  Pulse: 83  Resp: 16  Temp: 98.4 F (36.9 C)  TempSrc: Oral  SpO2: 100%  Weight: 156 lb (70.8 kg)  Height: 5\' 6"  (1.676 m)   Body mass index is 25.18 kg/m.  General: The patient appears her stated age.   HEENT:  No gross abnormalities Pulmonary: Respirations are non-labored, CTAB, good air movement in all fields.  Abdomen: Soft and non-tender Musculoskeletal: There are no major deformities. Bilateral hand grip is 5/5.  Neurologic: No focal weakness or paresthesias are detected Skin: There are no ulcer or rashes noted. Psychiatric: The patient has normal affect. Cardiovascular: There is a regular rate and rhythm without significant murmur appreciated.  Right TDC in place. Bilateral radial pulses are 2+ palpable. Right upper arm AVF with palpable thrill to proximal portion, and audible bruit throughout.   Non-Invasive Vascular Imaging  Right armAccess Duplex  (Date: 04/20/2018):  Findings: +--------------------+----------+-----------------+--------+ AVF                 PSV (cm/s)Flow Vol (mL/min)Comments +--------------------+----------+-----------------+--------+  Native artery inflow   167           911                +--------------------+----------+-----------------+--------+ AVF Anastomosis        372                              +--------------------+----------+-----------------+--------+   +------------+----------+-------------+----------+--------+ OUTFLOW VEINPSV (cm/s)Diameter (cm)Depth (cm)Describe +------------+----------+-------------+----------+--------+ Prox UA        108        0.70        0.90            +------------+----------+-------------+----------+--------+  Mid UA          76        0.93        0.49            +------------+----------+-------------+----------+--------+ Dist UA        105        0.93        0.66            +------------+----------+-------------+----------+--------+ AC Fossa       578        0.79        0.47            +------------+----------+-------------+----------+--------+   Summary: Patent right brachiocephalic AVF.    Medical Decision Making  Sara Meza is a 43 y.o. female who is s/p right upper arm brachiocephalic AV fistula creation on 03-02-18 by Dr. Donzetta Matters.    I discussed with Dr. Carlis Abbott pt pt HPI, pertinent physical exam results, and AVF duplex results. Depths are adequate except proximally, diameters are all adequate, PSV are adequate.   Right upper arm AVF may be accessed 12 weeks after the AVF creation, which would be about 05-31-18. Protocol to be followed for removal of TDC.  If AVF is not usable at that time, will schedule fistula-gram.   Follow up with [place] as needed.    Clemon Chambers, RN, MSN, FNP-C [facility] Office: [phone]  04/20/2018, 1:18 PM  Clinic MD: Zerita Boers

## 2018-04-24 ENCOUNTER — Ambulatory Visit (HOSPITAL_COMMUNITY)
Admission: RE | Admit: 2018-04-24 | Discharge: 2018-04-24 | Disposition: A | Discharge status: Home / Self Care | Payer: Medicaid Other | Source: Ambulatory Visit | Attending: Nephrology | Admitting: Nephrology

## 2018-04-24 ENCOUNTER — Other Ambulatory Visit: Payer: Self-pay

## 2018-04-24 DIAGNOSIS — D649 Anemia, unspecified: Secondary | ICD-10-CM | POA: Insufficient documentation

## 2018-04-24 DIAGNOSIS — N186 End stage renal disease: Secondary | ICD-10-CM | POA: Diagnosis not present

## 2018-04-24 LAB — PREPARE RBC (CROSSMATCH)

## 2018-04-24 MED ORDER — SODIUM CHLORIDE 0.9% IV SOLUTION: Freq: Once | INTRAVENOUS | Status: DC

## 2018-04-24 NOTE — Discharge Instructions (Signed)
Blood Transfusion, Adult, Care After This sheet gives you information about how to care for yourself after your procedure. Your doctor may also give you more specific instructions. If you have problems or questions, contact your doctor. Follow these instructions at home:   Take over-the-counter and prescription medicines only as told by your doctor.  Go back to your normal activities as told by your doctor.  Follow instructions from your doctor about how to take care of the area where an IV tube was put into your vein (insertion site). Make sure you: ? Wash your hands with soap and water before you change your bandage (dressing). If there is no soap and water, use hand sanitizer. ? Change your bandage as told by your doctor.  Check your IV insertion site every day for signs of infection. Check for: ? More redness, swelling, or pain. ? More fluid or blood. ? Warmth. ? Pus or a bad smell. Contact a doctor if:  You have more redness, swelling, or pain around the IV insertion site.  You have more fluid or blood coming from the IV insertion site.  Your IV insertion site feels warm to the touch.  You have pus or a bad smell coming from the IV insertion site.  Your pee (urine) turns pink, red, or brown.  You feel weak after doing your normal activities. Get help right away if:  You have signs of a serious allergic or body defense (immune) system reaction, including: ? Itchiness. ? Hives. ? Trouble breathing. ? Anxiety. ? Pain in your chest or lower back. ? Fever, flushing, and chills. ? Fast pulse. ? Rash. ? Watery poop (diarrhea). ? Throwing up (vomiting). ? Dark pee. ? Serious headache. ? Dizziness. ? Stiff neck. ? Yellow color in your face or the white parts of your eyes (jaundice). Summary  After a blood transfusion, return to your normal activities as told by your doctor.  Every day, check for signs of infection where the IV tube was put into your vein.  Some  signs of infection are warm skin, more redness and pain, more fluid or blood, and pus or a bad smell where the needle went in.  Contact your doctor if you feel weak or have any unusual symptoms. This information is not intended to replace advice given to you by your health care provider. Make sure you discuss any questions you have with your health care provider. Document Released: 01/25/2014 Document Revised: 08/29/2015 Document Reviewed: 08/29/2015 Elsevier Interactive Patient Education  2019 Elsevier Inc.  

## 2018-04-24 NOTE — Progress Notes (Signed)
PATIENT CARE CENTER NOTE  Diagnosis: ESRD, Anemia    Provider: Otelia Santee, MD   Procedure: 1 unit PRBC's    Note: Patient received 1 unit of blood via PIV. Tolerated well with no adverse reaction. Vital signs remained stable. Discharge instructions given. Patient alert, oriented and ambulatory at discharge.

## 2018-04-25 LAB — TYPE AND SCREEN
ABO/RH(D): O NEG
Antibody Screen: NEGATIVE
Unit division: 0

## 2018-04-25 LAB — BPAM RBC
Blood Product Expiration Date: 202004232359
ISSUE DATE / TIME: 202004060930
Unit Type and Rh: 9500

## 2018-05-15 ENCOUNTER — Encounter (HOSPITAL_COMMUNITY): Payer: Self-pay | Admitting: Emergency Medicine

## 2018-05-15 ENCOUNTER — Emergency Department (HOSPITAL_COMMUNITY)
Admission: EM | Admit: 2018-05-15 | Discharge: 2018-05-16 | Disposition: A | Discharge status: Home / Self Care | Payer: Medicaid Other | Attending: Emergency Medicine | Admitting: Emergency Medicine

## 2018-05-15 ENCOUNTER — Other Ambulatory Visit: Payer: Self-pay

## 2018-05-15 DIAGNOSIS — Z79899 Other long term (current) drug therapy: Secondary | ICD-10-CM | POA: Diagnosis not present

## 2018-05-15 DIAGNOSIS — N186 End stage renal disease: Secondary | ICD-10-CM | POA: Insufficient documentation

## 2018-05-15 DIAGNOSIS — N920 Excessive and frequent menstruation with regular cycle: Secondary | ICD-10-CM | POA: Diagnosis not present

## 2018-05-15 DIAGNOSIS — I120 Hypertensive chronic kidney disease with stage 5 chronic kidney disease or end stage renal disease: Secondary | ICD-10-CM | POA: Diagnosis not present

## 2018-05-15 DIAGNOSIS — Z992 Dependence on renal dialysis: Secondary | ICD-10-CM | POA: Insufficient documentation

## 2018-05-15 DIAGNOSIS — D649 Anemia, unspecified: Secondary | ICD-10-CM | POA: Diagnosis present

## 2018-05-15 LAB — CBC
HCT: 17.3 % — ABNORMAL LOW (ref 36.0–46.0)
Hemoglobin: 6 g/dL — CL (ref 12.0–15.0)
MCH: 33.5 pg (ref 26.0–34.0)
MCHC: 34.7 g/dL (ref 30.0–36.0)
MCV: 96.6 fL (ref 80.0–100.0)
Platelets: 178 10*3/uL (ref 150–400)
RBC: 1.79 MIL/uL — ABNORMAL LOW (ref 3.87–5.11)
RDW: 14 % (ref 11.5–15.5)
WBC: 5 10*3/uL (ref 4.0–10.5)
nRBC: 0 % (ref 0.0–0.2)

## 2018-05-15 LAB — COMPREHENSIVE METABOLIC PANEL
ALT: 19 U/L (ref 0–44)
AST: 18 U/L (ref 15–41)
Albumin: 4 g/dL (ref 3.5–5.0)
Alkaline Phosphatase: 123 U/L (ref 38–126)
Anion gap: 11 (ref 5–15)
BUN: 17 mg/dL (ref 6–20)
CO2: 29 mmol/L (ref 22–32)
Calcium: 8.2 mg/dL — ABNORMAL LOW (ref 8.9–10.3)
Chloride: 99 mmol/L (ref 98–111)
Creatinine, Ser: 4.23 mg/dL — ABNORMAL HIGH (ref 0.44–1.00)
GFR calc Af Amer: 14 mL/min — ABNORMAL LOW (ref >60)
GFR calc non Af Amer: 12 mL/min — ABNORMAL LOW (ref >60)
Glucose, Bld: 161 mg/dL — ABNORMAL HIGH (ref 70–99)
Potassium: 3.7 mmol/L (ref 3.5–5.1)
Sodium: 139 mmol/L (ref 135–145)
Total Bilirubin: 0.5 mg/dL (ref 0.3–1.2)
Total Protein: 7.4 g/dL (ref 6.5–8.1)

## 2018-05-15 LAB — I-STAT BETA HCG BLOOD, ED (MC, WL, AP ONLY): I-stat hCG, quantitative: <5 m[IU]/mL (ref <5)

## 2018-05-15 LAB — HEMOGLOBIN AND HEMATOCRIT, BLOOD
HCT: 18.8 % — ABNORMAL LOW (ref 36.0–46.0)
Hemoglobin: 6.6 g/dL — CL (ref 12.0–15.0)

## 2018-05-15 LAB — PREPARE RBC (CROSSMATCH)

## 2018-05-15 MED ORDER — FUROSEMIDE 10 MG/ML IJ SOLN
20.0000 mg | Freq: Once | INTRAMUSCULAR | Status: AC
  Administered 2018-05-15: 20 mg via INTRAVENOUS
  Filled 2018-05-15: qty 4

## 2018-05-15 MED ORDER — SODIUM CHLORIDE 0.9% IV SOLUTION
Freq: Once | INTRAVENOUS | Status: AC
  Administered 2018-05-15: 10 mL/h via INTRAVENOUS

## 2018-05-15 MED ORDER — ACETAMINOPHEN 325 MG PO TABS: 650.0000 mg | ORAL_TABLET | Freq: Once | ORAL | Status: DC

## 2018-05-15 MED ORDER — ACETAMINOPHEN 325 MG PO TABS
650.0000 mg | ORAL_TABLET | Freq: Once | ORAL | Status: AC
  Administered 2018-05-15: 19:00:00 650 mg via ORAL
  Filled 2018-05-15: qty 2

## 2018-05-15 NOTE — ED Provider Notes (Signed)
43 year old female presents with low hgb. She has ESRD and was dialyzed today. Hgb here is 6. Baseline is 8-9. She is having some vaginal bleeding. Hemoccult was negative per previous provider although the patient states that she is supposed to have a colonoscopy done. Plan is to give 1 unit of prbcs and recheck H&H. The hospitalist Dr. Justice Rocher was consulted and she may be able to be discharged after one unit and we speak with nephrology.   After 1 unit hgb is 6.6. Discussed with Dr. Posey Pronto with nephrology. He recommends giving another unit and then d/c. Updated Dr. Justice Rocher - he recommends giving 20mg  Lasix to counteract fluid since she still makes urine. Updated pt and she is comfortable with plan. 2nd unit ordered.   9:59 PM 2nd unit is still running. Pt care signed out to L Sanders. Plan is to d/c after 2nd unit.    Recardo Evangelist, PA-C 05/15/18 2211    Dorie Rank, MD 05/16/18 [phone]

## 2018-05-15 NOTE — ED Provider Notes (Signed)
[facility] DEPT Provider Note   CSN: [id number] Arrival date & time: 05/15/18  1140    History   Chief Complaint Chief Complaint  Patient presents with  . low hemoglobin    HPI Sara Meza is a 43 y.o. female with history of hypertension, menorrhagia, kidney disease recently started on HD February 2020 MWF presents to the ED for evaluation of low hemoglobin.  Patient went to her scheduled dialysis and the RN noticed her hemoglobin was around 5.  She got fully dialyzed today. States this year she has required at least 2 blood transfusions for low hemoglobin.  Reports associated intermittent lightheadedness with standing up yesterday but otherwise no symptoms.  She began her menses on 4/24, usually has heavy menstrual bleeding but the last 2 months since beginning HD these have been much lighter.  Has also noticed black looser diarrhea ever since starting dialysis.  Is taking iron.  Minimal EtOH or NSAID use prior to starting dialysis.  No history of GI bleed, ulcers.  No headaches, vision changes, chest pain, shortness of breath, hematemesis    HPI  Past Medical History:  Diagnosis Date  . History of gestational diabetes 06/29/2012  . Hypertension     Patient Active Problem List   Diagnosis Date Noted  . Vitamin D deficiency 03/08/2018  . ESRD (end stage renal disease) on dialysis ([place]) 03/08/2018  . Anemia of chronic disease 03/08/2018  . Secondary hyperparathyroidism of renal origin ([place]) 03/08/2018  . CKD (chronic kidney disease), stage V ([place]) 02/23/2018  . Acute renal failure ([place]) 02/22/2018  . Family history of colon cancer requiring screening colonoscopy 02/22/2018  . Rash and nonspecific skin eruption 02/22/2018  . History of varicose veins of lower extremity 02/22/2018  . Peripheral edema 02/22/2018  . Renal failure 02/22/2018  . Iron deficiency anemia due to chronic blood loss 02/22/2018  . Severe anemia 02/22/2018  . Menorrhagia  with regular cycle 02/22/2018  . Hypertension goal BP (blood pressure) < 130/80 02/21/2018  . Iron deficiency 02/21/2018  . Status post tubal ligation 02/21/2018  . Hypertensive retinopathy 02/21/2018  . Essential hypertension 12/20/2014  . Left ankle pain 08/25/2012  . History of gestational diabetes 06/29/2012  . 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 Bilateral plantar fasciitis 06/29/2012  . Restless legs 06/29/2012    Past Surgical History:  Procedure Laterality Date  . AV FISTULA PLACEMENT Right 03/02/2018   Procedure: ARTERIOVENOUS (AV) FISTULA CREATION;  Surgeon: Waynetta Sandy, MD;  Location: [facility];  Service: Vascular;  Laterality: Right;  . ENDOVENOUS ABLATION SAPHENOUS VEIN W/ LASER    . IR FLUORO GUIDE CV LINE RIGHT  02/24/2018  . IR US GUIDE VASC ACCESS RIGHT  02/24/2018  . TUBAL LIGATION       OB History    Gravida  3   Para  3   Term      Preterm      AB      Living        SAB      TAB      Ectopic      Multiple      Live Births               Home Medications    Prior to Admission medications   Medication Sig Start Date End Date Taking? Authorizing Provider  ferric citrate (AURYXIA) 1 GM 210 MG(Fe) tablet Take 3 tablets (630 mg total) by mouth 3 (three) times daily with meals. 03/03/18  Lavina Hamman, MD  multivitamin (RENA-VIT) TABS tablet Take 1 tablet by mouth daily. as directed 03/22/18   [provider]  pantoprazole (PROTONIX) 40 MG tablet Take 1 tablet (40 mg total) by mouth daily before breakfast for 14 days. 03/03/18 03/17/18  Lavina Hamman, MD  Vitamin D, Ergocalciferol, (DRISDOL) 1.25 MG (50000 UT) CAPS capsule Take 1 capsule (50,000 Units total) by mouth every 7 (seven) days. 03/10/18   Lavina Hamman, MD    Family History Family History  Problem Relation Age of Onset  . Diabetes Father   . Hypertension Father   . Diabetes Mother   . Hypertension Mother   . Colon cancer Sister   . Wilm's tumor Sister      Social History Social History   Tobacco Use  . Smoking status: Never Smoker  . Smokeless tobacco: Never Used  Substance Use Topics  . Alcohol use: Not Currently  . Drug use: Never     Allergies   Lisinopril   Review of Systems Review of Systems  Neurological: Positive for light-headedness.  All other systems reviewed and are negative.    Physical Exam Updated Vital Signs BP 126/80   Pulse 88   Temp 98.5 F (36.9 C) (Oral)   Resp (!) 39   LMP 05/12/2018   SpO2 100%   Physical Exam Vitals signs and nursing note reviewed.  Constitutional:      Appearance: She is well-developed.     Comments: Non toxic in NAD  HENT:     Head: Normocephalic and atraumatic.     Nose: Nose normal.  Eyes:     Conjunctiva/sclera: Conjunctivae normal.     Comments: Conjunctival pallor  Neck:     Musculoskeletal: Normal range of motion.  Cardiovascular:     Rate and Rhythm: Normal rate and regular rhythm.     Comments: Palpable AV fistula with thrill to the right upper extremity. Pulmonary:     Effort: Pulmonary effort is normal.     Breath sounds: Normal breath sounds.  Abdominal:     General: Bowel sounds are normal.     Palpations: Abdomen is soft.     Tenderness: There is no abdominal tenderness.     Comments: No G/R/R. No suprapubic or CVA tenderness. Negative Murphy's and McBurney's. Active BS to lower quadrants.   Genitourinary:    Rectum: Guaiac result negative.     Comments:  Black stool, negative hemoccult. Scant amount of blood in pad but no heavy bleeding. Musculoskeletal: Normal range of motion.  Skin:    General: Skin is warm and dry.     Capillary Refill: Capillary refill takes less than 2 seconds.  Neurological:     Mental Status: She is alert.  Psychiatric:        Behavior: Behavior normal.      ED Treatments / Results  Labs (all labs ordered are listed, but only abnormal results are displayed) Labs Reviewed  COMPREHENSIVE METABOLIC PANEL - Abnormal;  Notable for the following components:      Result Value   Glucose, Bld 161 (*)    Creatinine, Ser 4.23 (*)    Calcium 8.2 (*)    GFR calc non Af Amer 12 (*)    GFR calc Af Amer 14 (*)    All other components within normal limits  CBC - Abnormal; Notable for the following components:   RBC 1.79 (*)    Hemoglobin 6.0 (*)    HCT 17.3 (*)  All other components within normal limits  I-STAT BETA HCG BLOOD, ED ([facility], [facility], AP ONLY)  POC OCCULT BLOOD, ED  TYPE AND SCREEN  PREPARE RBC (CROSSMATCH)    EKG EKG Interpretation  Date/Time:  Monday May 15 2018 12:34:24 EDT Ventricular Rate:  94 PR Interval:    QRS Duration: 97 QT Interval:  403 QTC Calculation: 504 R Axis:   -64 Text Interpretation:  Sinus rhythm Left anterior fascicular block Borderline prolonged QT interval Baseline wander in lead(s) V6 Confirmed by Veryl Speak [phone]) on 05/15/2018 1:16:18 PM   Radiology No results found.  Procedures .Critical Care Performed by: Kinnie Feil, PA-C Authorized by: Kinnie Feil, PA-C   Critical care provider statement:    Critical care time (minutes):  45   Critical care was necessary to treat or prevent imminent or life-threatening deterioration of the following conditions: symptomatic anemia.   Critical care was time spent personally by me on the following activities:  Discussions with consultants, evaluation of patient's response to treatment, examination of patient, ordering and performing treatments and interventions, ordering and review of laboratory studies, ordering and review of radiographic studies, pulse oximetry, re-evaluation of patient's condition, obtaining history from patient or surrogate and review of old charts   I assumed direction of critical care for this patient from another provider in my specialty: no     (including critical care time)  Medications Ordered in ED Medications  0.9 %  sodium chloride infusion (Manually program via Guardrails IV  Fluids) (has no administration in time range)     Initial Impression / Assessment and Plan / ED Course  I have reviewed the triage vital signs and the nursing notes.  Pertinent labs & imaging results that were available during my care of the patient were reviewed by me and considered in my medical decision making (see chart for details).  Clinical Course as of May 15 1447  Mon May 15, 2018  1222 Dialysis at [facility]    [CG]  1254 Hemoglobin(!!): 6.0 [CG]  1254 Creatinine(!): 4.23 [CG]  1254 GFR, Est Non African American(!): 12 [CG]  1436 Qtc 505  EKG 12-Lead [CG]    Clinical Course User Index [CG] Kinnie Feil, PA-C      Labs as above suggesting symptomatic anemia in setting of menorrhagia. Black stools on DRE but negative hemoccult. Tachycardic otherwise HD stable. K and Creatinine elevated mostly at baseline in setting of scheduled dialysis today.   Plan to given 2 units of blood.  Given current menorrhagia, will benefit from observation to follow up on H/H, potentially get dialysis, megace.  Has not required changing pad in ED. Pending consult for admission. Discussed with EDMD.  1450: Discussed with Dr Pietro Cassis.  Pt may be able to get 1 unit of PRB, recheck H/H and be discharged from ED.  Low threshold for admission if signs of fluid overload, or CV decompensation in ED.  Will order H/H in 1 hr. Dr Pietro Cassis will follow patient to assist with disposition.  Final Clinical Impressions(s) / ED Diagnoses   1615: Patient handed off to oncoming EDPA. Will recheck H/H, consult nephrology to determine if they recommend second unit. If requires more blood Dr Pietro Cassis will admit.  Otherwise patient may be discharged and get transfusion at next dialysis session Wednesday. Dr Pietro Cassis has seen patient.  Final diagnoses:  Symptomatic anemia  Menorrhagia with regular cycle    ED Discharge Orders    None  Kinnie Feil, PA-C 05/15/18 1615    Veryl Speak, MD 05/18/18 [phone]

## 2018-05-15 NOTE — ED Notes (Signed)
Pt able to independently ambulate to bathroom and back to room.  No complaints. Will continue to monitor.

## 2018-05-15 NOTE — ED Notes (Signed)
Date and time results received: 05/15/18 1756 (use smartphrase ".now" to insert current time)  Test: hgb Critical Value: 6.6  Name of Provider Notified: Janetta Hora  Orders Received? Or Actions Taken?: reported to Janetta Hora pt has hgb of 6.6.

## 2018-05-15 NOTE — ED Notes (Signed)
Pt with c/o back pain, generalized pain to legs and arms.  Pt reports this is unlike her usual feeling after dialysis.  PA Claiborne Billings made aware.

## 2018-05-15 NOTE — ED Notes (Signed)
Date and time results received: 05/15/18 12:38 PM  (use smartphrase ".now" to insert current time)  Test: HGB Critical Value: 6.0  Name of Provider Notified: Cari RN   Orders Received? Or Actions Taken?:

## 2018-05-15 NOTE — ED Triage Notes (Signed)
Reports when she had her dialysis done today was told that her hgb was 5.6 and down from Friday when 6.6.  Has scheduled transfusion on May 8, but was told that is too far away.

## 2018-05-16 LAB — TYPE AND SCREEN
ABO/RH(D): O NEG
Antibody Screen: NEGATIVE
Unit division: 0
Unit division: 0

## 2018-05-16 LAB — BPAM RBC
Blood Product Expiration Date: 202005012359
Blood Product Expiration Date: 202005032359
ISSUE DATE / TIME: 202004271322
ISSUE DATE / TIME: 202004271950
Unit Type and Rh: 9500
Unit Type and Rh: 9500

## 2018-05-23 ENCOUNTER — Ambulatory Visit (HOSPITAL_COMMUNITY)
Admission: RE | Admit: 2018-05-23 | Discharge: 2018-05-23 | Disposition: A | Discharge status: Home / Self Care | Payer: Medicare Other | Source: Ambulatory Visit | Attending: Nephrology | Admitting: Nephrology

## 2018-05-23 ENCOUNTER — Other Ambulatory Visit: Payer: Self-pay

## 2018-05-23 DIAGNOSIS — D649 Anemia, unspecified: Secondary | ICD-10-CM | POA: Insufficient documentation

## 2018-05-23 LAB — PREPARE RBC (CROSSMATCH)

## 2018-05-23 MED ORDER — SODIUM CHLORIDE 0.9% IV SOLUTION
Freq: Once | INTRAVENOUS | Status: AC
  Administered 2018-05-23: 10:00:00 via INTRAVENOUS

## 2018-05-23 NOTE — Discharge Instructions (Signed)
Blood Transfusion, Adult, Care After This sheet gives you information about how to care for yourself after your procedure. Your doctor may also give you more specific instructions. If you have problems or questions, contact your doctor. Follow these instructions at home:   Take over-the-counter and prescription medicines only as told by your doctor.  Go back to your normal activities as told by your doctor.  Follow instructions from your doctor about how to take care of the area where an IV tube was put into your vein (insertion site). Make sure you: ? Wash your hands with soap and water before you change your bandage (dressing). If there is no soap and water, use hand sanitizer. ? Change your bandage as told by your doctor.  Check your IV insertion site every day for signs of infection. Check for: ? More redness, swelling, or pain. ? More fluid or blood. ? Warmth. ? Pus or a bad smell. Contact a doctor if:  You have more redness, swelling, or pain around the IV insertion site.  You have more fluid or blood coming from the IV insertion site.  Your IV insertion site feels warm to the touch.  You have pus or a bad smell coming from the IV insertion site.  Your pee (urine) turns pink, red, or brown.  You feel weak after doing your normal activities. Get help right away if:  You have signs of a serious allergic or body defense (immune) system reaction, including: ? Itchiness. ? Hives. ? Trouble breathing. ? Anxiety. ? Pain in your chest or lower back. ? Fever, flushing, and chills. ? Fast pulse. ? Rash. ? Watery poop (diarrhea). ? Throwing up (vomiting). ? Dark pee. ? Serious headache. ? Dizziness. ? Stiff neck. ? Yellow color in your face or the white parts of your eyes (jaundice). Summary  After a blood transfusion, return to your normal activities as told by your doctor.  Every day, check for signs of infection where the IV tube was put into your vein.  Some  signs of infection are warm skin, more redness and pain, more fluid or blood, and pus or a bad smell where the needle went in.  Contact your doctor if you feel weak or have any unusual symptoms. This information is not intended to replace advice given to you by your health care provider. Make sure you discuss any questions you have with your health care provider. Document Released: 01/25/2014 Document Revised: 08/29/2015 Document Reviewed: 08/29/2015 Elsevier Interactive Patient Education  2019 Elsevier Inc.  

## 2018-05-23 NOTE — Progress Notes (Signed)
Patient was transfused with 1 unit of packed red blood cells via a PIV. Type and screen lab was also drawn. Tolerated well, vitals stable, discharge instructions given, verbalized understanding. Patient alert, oriented and ambulatory at the time of discharge.

## 2018-05-24 LAB — TYPE AND SCREEN
ABO/RH(D): O NEG
Antibody Screen: NEGATIVE
Unit division: 0

## 2018-05-24 LAB — BPAM RBC
Blood Product Expiration Date: 202005292359
ISSUE DATE / TIME: 202005050950
Unit Type and Rh: 9500

## 2018-06-27 ENCOUNTER — Emergency Department (HOSPITAL_COMMUNITY)
Admission: EM | Admit: 2018-06-27 | Discharge: 2018-06-27 | Disposition: A | Discharge status: Home / Self Care | Payer: Medicare Other | Attending: Emergency Medicine | Admitting: Emergency Medicine

## 2018-06-27 ENCOUNTER — Encounter (HOSPITAL_COMMUNITY): Payer: Self-pay

## 2018-06-27 ENCOUNTER — Other Ambulatory Visit: Payer: Self-pay

## 2018-06-27 DIAGNOSIS — I120 Hypertensive chronic kidney disease with stage 5 chronic kidney disease or end stage renal disease: Secondary | ICD-10-CM | POA: Diagnosis not present

## 2018-06-27 DIAGNOSIS — N186 End stage renal disease: Secondary | ICD-10-CM | POA: Insufficient documentation

## 2018-06-27 DIAGNOSIS — Z79899 Other long term (current) drug therapy: Secondary | ICD-10-CM | POA: Insufficient documentation

## 2018-06-27 DIAGNOSIS — D631 Anemia in chronic kidney disease: Secondary | ICD-10-CM

## 2018-06-27 DIAGNOSIS — B349 Viral infection, unspecified: Secondary | ICD-10-CM | POA: Diagnosis not present

## 2018-06-27 DIAGNOSIS — Z992 Dependence on renal dialysis: Secondary | ICD-10-CM | POA: Diagnosis not present

## 2018-06-27 DIAGNOSIS — R509 Fever, unspecified: Secondary | ICD-10-CM | POA: Diagnosis present

## 2018-06-27 DIAGNOSIS — N189 Chronic kidney disease, unspecified: Secondary | ICD-10-CM

## 2018-06-27 DIAGNOSIS — Z20828 Contact with and (suspected) exposure to other viral communicable diseases: Secondary | ICD-10-CM | POA: Diagnosis not present

## 2018-06-27 HISTORY — DX: Dependence on renal dialysis: Z99.2

## 2018-06-27 HISTORY — DX: Other disorders of lacrimal system: H04.89

## 2018-06-27 LAB — CBC WITH DIFFERENTIAL/PLATELET
Abs Immature Granulocytes: 0.02 10*3/uL (ref 0.00–0.07)
Basophils Absolute: 0 10*3/uL (ref 0.0–0.1)
Basophils Relative: 1 %
Eosinophils Absolute: 0.1 10*3/uL (ref 0.0–0.5)
Eosinophils Relative: 2 %
HCT: 19.6 % — ABNORMAL LOW (ref 36.0–46.0)
Hemoglobin: 6.5 g/dL — CL (ref 12.0–15.0)
Immature Granulocytes: 0 %
Lymphocytes Relative: 20 %
Lymphs Abs: 1.1 10*3/uL (ref 0.7–4.0)
MCH: 32.7 pg (ref 26.0–34.0)
MCHC: 33.2 g/dL (ref 30.0–36.0)
MCV: 98.5 fL (ref 80.0–100.0)
Monocytes Absolute: 0.4 10*3/uL (ref 0.1–1.0)
Monocytes Relative: 7 %
Neutro Abs: 4.1 10*3/uL (ref 1.7–7.7)
Neutrophils Relative %: 70 %
Platelets: 212 10*3/uL (ref 150–400)
RBC: 1.99 MIL/uL — ABNORMAL LOW (ref 3.87–5.11)
RDW: 15.2 % (ref 11.5–15.5)
WBC: 5.8 10*3/uL (ref 4.0–10.5)
nRBC: 0 % (ref 0.0–0.2)

## 2018-06-27 LAB — BASIC METABOLIC PANEL
Anion gap: 11 (ref 5–15)
BUN: 37 mg/dL — ABNORMAL HIGH (ref 6–20)
CO2: 25 mmol/L (ref 22–32)
Calcium: 8.7 mg/dL — ABNORMAL LOW (ref 8.9–10.3)
Chloride: 101 mmol/L (ref 98–111)
Creatinine, Ser: 7.5 mg/dL — ABNORMAL HIGH (ref 0.44–1.00)
GFR calc Af Amer: 7 mL/min — ABNORMAL LOW (ref >60)
GFR calc non Af Amer: 6 mL/min — ABNORMAL LOW (ref >60)
Glucose, Bld: 125 mg/dL — ABNORMAL HIGH (ref 70–99)
Potassium: 5 mmol/L (ref 3.5–5.1)
Sodium: 137 mmol/L (ref 135–145)

## 2018-06-27 NOTE — ED Notes (Signed)
Merry Proud aware of 6.5 hgb

## 2018-06-27 NOTE — ED Notes (Signed)
Patient verbalizes understanding of discharge instructions. Opportunity for questioning and answering were provided.  patient discharged from ED.  

## 2018-06-27 NOTE — ED Triage Notes (Addendum)
Pt states she went to go get her HD today but was told needed to come here to get tested for covid since she is having symptoms and would not start her HD today; pt states she is having sore throat, body aches , fever ,nausea and coughing that began yesterday; pt also states that her hgb was 5.5 and is scheduled to get a blood transfusion tomorrow

## 2018-06-27 NOTE — ED Provider Notes (Signed)
[facility] EMERGENCY DEPARTMENT Provider Note   CSN: [id number] Arrival date & time: 06/27/18  1213    History   Chief Complaint Chief Complaint  Patient presents with  . Fever  . Sore Throat    HPI Sara Meza is a 43 y.o. female.     HPI   43 year old female with a history of end-stage renal disease on dialysis resents today with complaints of fever.  Patient notes that she has renal failure of unknown etiology no other acute medical concerns other than minor hypertension.  She reports last night she had sweats, went to dialysis morning and was told she had a temperature in the 101 range.  She notes she had one episode of vomiting this morning and several bouts of nonbloody diarrhea today.  She denies any abdominal pain.  She denies any cough, notes minor sore throat.  She denies any close sick contacts, or known covert exposures.  She did not receive her dialysis today.  She notes she is anemic in the 5 range and is scheduled for blood transfusion tomorrow.    Past Medical History:  Diagnosis Date  . Dialysis patient ([place])   . Hemolacria   . History of gestational diabetes 06/29/2012  . Hypertension     Patient Active Problem List   Diagnosis Date Noted  . Vitamin D deficiency 03/08/2018  . ESRD (end stage renal disease) on dialysis ([place]) 03/08/2018  . Anemia of chronic disease 03/08/2018  . Secondary hyperparathyroidism of renal origin ([place]) 03/08/2018  . CKD (chronic kidney disease), stage V ([place]) 02/23/2018  . Acute renal failure ([place]) 02/22/2018  . Family history of colon cancer requiring screening colonoscopy 02/22/2018  . Rash and nonspecific skin eruption 02/22/2018  . History of varicose veins of lower extremity 02/22/2018  . Peripheral edema 02/22/2018  . Renal failure 02/22/2018  . Iron deficiency anemia due to chronic blood loss 02/22/2018  . Severe anemia 02/22/2018  . Menorrhagia with regular cycle 02/22/2018  . Hypertension goal BP  (blood pressure) < 130/80 02/21/2018  . Iron deficiency 02/21/2018  . Status post tubal ligation 02/21/2018  . Hypertensive retinopathy 02/21/2018  . Essential hypertension 12/20/2014  . Left ankle pain 08/25/2012  . History of gestational diabetes 06/29/2012  . 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 Bilateral plantar fasciitis 06/29/2012  . Restless legs 06/29/2012    Past Surgical History:  Procedure Laterality Date  . AV FISTULA PLACEMENT Right 03/02/2018   Procedure: ARTERIOVENOUS (AV) FISTULA CREATION;  Surgeon: Waynetta Sandy, MD;  Location: [facility];  Service: Vascular;  Laterality: Right;  . ENDOVENOUS ABLATION SAPHENOUS VEIN W/ LASER    . IR FLUORO GUIDE CV LINE RIGHT  02/24/2018  . IR US GUIDE VASC ACCESS RIGHT  02/24/2018  . TUBAL LIGATION       OB History    Gravida  3   Para  3   Term      Preterm      AB      Living        SAB      TAB      Ectopic      Multiple      Live Births               Home Medications    Prior to Admission medications   Medication Sig Start Date End Date Taking? Authorizing Provider  ferric citrate (AURYXIA) 1 GM 210 MG(Fe) tablet Take 3 tablets (630 mg total) by  mouth 3 (three) times daily with meals. 03/03/18   Lavina Hamman, MD  multivitamin (RENA-VIT) TABS tablet Take 1 tablet by mouth daily. as directed 03/22/18   [provider]  pantoprazole (PROTONIX) 40 MG tablet Take 1 tablet (40 mg total) by mouth daily before breakfast for 14 days. 03/03/18 03/17/18  Lavina Hamman, MD  Vitamin D, Ergocalciferol, (DRISDOL) 1.25 MG (50000 UT) CAPS capsule Take 1 capsule (50,000 Units total) by mouth every 7 (seven) days. 03/10/18   Lavina Hamman, MD    Family History Family History  Problem Relation Age of Onset  . Diabetes Father   . Hypertension Father   . Diabetes Mother   . Hypertension Mother   . Colon cancer Sister   . Wilm's tumor Sister     Social History Social History   Tobacco Use  .  Smoking status: Never Smoker  . Smokeless tobacco: Never Used  Substance Use Topics  . Alcohol use: Not Currently  . Drug use: Never     Allergies   Lisinopril   Review of Systems Review of Systems  All other systems reviewed and are negative.   Physical Exam Updated Vital Signs BP (!) 136/98 (BP Location: Right Arm)   Pulse 93   Temp 99.2 F (37.3 C) (Oral)   Resp 18   Ht 5\' 7"  (1.702 m)   Wt 70.8 kg   SpO2 100%   BMI 24.43 kg/m   Physical Exam Vitals signs and nursing note reviewed.  Constitutional:      Appearance: She is well-developed.  HENT:     Head: Normocephalic and atraumatic.     Comments: Oropharynx clear no erythema edema or exudate Eyes:     General: No scleral icterus.       Right eye: No discharge.        Left eye: No discharge.     Conjunctiva/sclera: Conjunctivae normal.     Pupils: Pupils are equal, round, and reactive to light.  Neck:     Musculoskeletal: Normal range of motion.     Vascular: No JVD.     Trachea: No tracheal deviation.  Pulmonary:     Effort: Pulmonary effort is normal. No respiratory distress.     Breath sounds: Normal breath sounds. No stridor. No wheezing, rhonchi or rales.  Abdominal:     General: Abdomen is flat. There is no distension.     Palpations: Abdomen is soft.     Tenderness: There is no abdominal tenderness. There is no guarding.  Neurological:     Mental Status: She is alert and oriented to person, place, and time.     Coordination: Coordination normal.  Psychiatric:        Behavior: Behavior normal.        Thought Content: Thought content normal.        Judgment: Judgment normal.     ED Treatments / Results  Labs (all labs ordered are listed, but only abnormal results are displayed) Labs Reviewed  CBC WITH DIFFERENTIAL/PLATELET - Abnormal; Notable for the following components:      Result Value   RBC 1.99 (*)    Hemoglobin 6.5 (*)    HCT 19.6 (*)    All other components within normal limits   BASIC METABOLIC PANEL - Abnormal; Notable for the following components:   Glucose, Bld 125 (*)    BUN 37 (*)    Creatinine, Ser 7.50 (*)    Calcium 8.7 (*)  GFR calc non Af Amer 6 (*)    GFR calc Af Amer 7 (*)    All other components within normal limits  NOVEL CORONAVIRUS, NAA (HOSPITAL ORDER, SEND-OUT TO REF LAB)   EKG None  Radiology No results found.  Procedures Procedures (including critical care time)  Medications Ordered in ED Medications - No data to display   Initial Impression / Assessment and Plan / ED Course  I have reviewed the triage vital signs and the nursing notes.  Pertinent labs & imaging results that were available during my care of the patient were reviewed by me and considered in my medical decision making (see chart for details).          Labs: Coronavirus, CBC BMP  Assessment/Plan: 43 year old female presents today with febrile illness.  She is afebrile presently with no signs of illness.  Given her renal disease and need for [facility] testing for dialysis, test will be ordered.  As per email we are unable to obtain the 2-hour in-house testing given supplies as she does not need an emergent procedure.  Send out test ordered.  Patient is anemic she is 6.5 today which is improved from the reported 5 range.  This is likely secondary to renal disease as she denies any sources of bleeding and is not significantly changed over the last several months.  I stood her up in the room she had no pain shortness of breath or dizziness.  Patient's labs are reassuring.  She is stable for outpatient management.  She is given precautions for coronavirus.  She will return immediately if she develops any concerning signs or symptoms.  She had no further questions or concerns at time of discharge.   Final Clinical Impressions(s) / ED Diagnoses   Final diagnoses:  Anemia due to chronic kidney disease, unspecified CKD stage  Viral illness    ED Discharge Orders    None        Okey Regal, PA-C 06/27/18 1455    Sherwood Gambler, MD 06/29/18 [phone]

## 2018-06-27 NOTE — ED Notes (Signed)
Also reports n/v/diarrhea so bad she messed her self

## 2018-06-27 NOTE — Discharge Instructions (Addendum)
Please read the attached information.  Please continue outpatient follow-up for transfusion as previously scheduled.  If you develop any new or worsening signs or symptoms return immediately for further evaluation and management.

## 2018-06-28 ENCOUNTER — Ambulatory Visit (HOSPITAL_COMMUNITY)
Admission: RE | Admit: 2018-06-28 | Discharge: 2018-06-28 | Disposition: A | Discharge status: Home / Self Care | Payer: Medicare Other | Source: Ambulatory Visit | Attending: Nephrology | Admitting: Nephrology

## 2018-06-28 DIAGNOSIS — N186 End stage renal disease: Secondary | ICD-10-CM | POA: Diagnosis not present

## 2018-06-28 DIAGNOSIS — D649 Anemia, unspecified: Secondary | ICD-10-CM | POA: Diagnosis not present

## 2018-06-28 LAB — NOVEL CORONAVIRUS, NAA (HOSP ORDER, SEND-OUT TO REF LAB; TAT 18-24 HRS): SARS-CoV-2, NAA: NOT DETECTED

## 2018-06-28 LAB — PREPARE RBC (CROSSMATCH)

## 2018-06-28 MED ORDER — SODIUM CHLORIDE 0.9% IV SOLUTION
Freq: Once | INTRAVENOUS | Status: AC
  Administered 2018-06-28: 10:00:00 via INTRAVENOUS

## 2018-06-28 MED ORDER — ACETAMINOPHEN 325 MG PO TABS
325.0000 mg | ORAL_TABLET | Freq: Once | ORAL | Status: AC
  Administered 2018-06-28: 325 mg via ORAL
  Filled 2018-06-28: qty 1

## 2018-06-28 MED ORDER — DIPHENHYDRAMINE HCL 25 MG PO CAPS
25.0000 mg | ORAL_CAPSULE | Freq: Once | ORAL | Status: AC
  Administered 2018-06-28: 25 mg via ORAL
  Filled 2018-06-28: qty 1

## 2018-06-28 NOTE — Progress Notes (Signed)
PATIENT CARE CENTER NOTE    Diagnosis: ESRD, Anemia    Provider: Otelia Santee, MD   Procedure: 1 unit PRBC's    Note: Patient received premed tylenol, benadryl and  1 unit of blood via PIV. Tolerated well with no adverse reaction. Vital signs remained stable. Discharge instructions given. Patient alert, oriented and ambulatory at discharge.

## 2018-06-28 NOTE — Discharge Instructions (Signed)
Blood Transfusion, Adult, Care After This sheet gives you information about how to care for yourself after your procedure. Your doctor may also give you more specific instructions. If you have problems or questions, contact your doctor. Follow these instructions at home:   Take over-the-counter and prescription medicines only as told by your doctor.  Go back to your normal activities as told by your doctor.  Follow instructions from your doctor about how to take care of the area where an IV tube was put into your vein (insertion site). Make sure you: ? Wash your hands with soap and water before you change your bandage (dressing). If there is no soap and water, use hand sanitizer. ? Change your bandage as told by your doctor.  Check your IV insertion site every day for signs of infection. Check for: ? More redness, swelling, or pain. ? More fluid or blood. ? Warmth. ? Pus or a bad smell. Contact a doctor if:  You have more redness, swelling, or pain around the IV insertion site.  You have more fluid or blood coming from the IV insertion site.  Your IV insertion site feels warm to the touch.  You have pus or a bad smell coming from the IV insertion site.  Your pee (urine) turns pink, red, or brown.  You feel weak after doing your normal activities. Get help right away if:  You have signs of a serious allergic or body defense (immune) system reaction, including: ? Itchiness. ? Hives. ? Trouble breathing. ? Anxiety. ? Pain in your chest or lower back. ? Fever, flushing, and chills. ? Fast pulse. ? Rash. ? Watery poop (diarrhea). ? Throwing up (vomiting). ? Dark pee. ? Serious headache. ? Dizziness. ? Stiff neck. ? Yellow color in your face or the white parts of your eyes (jaundice). Summary  After a blood transfusion, return to your normal activities as told by your doctor.  Every day, check for signs of infection where the IV tube was put into your vein.  Some  signs of infection are warm skin, more redness and pain, more fluid or blood, and pus or a bad smell where the needle went in.  Contact your doctor if you feel weak or have any unusual symptoms. This information is not intended to replace advice given to you by your health care provider. Make sure you discuss any questions you have with your health care provider. Document Released: 01/25/2014 Document Revised: 08/29/2015 Document Reviewed: 08/29/2015 Elsevier Interactive Patient Education  2019 Elsevier Inc.  

## 2018-06-29 ENCOUNTER — Ambulatory Visit (INDEPENDENT_AMBULATORY_CARE_PROVIDER_SITE_OTHER): Payer: Medicare Other | Admitting: Obstetrics & Gynecology

## 2018-06-29 ENCOUNTER — Encounter: Payer: Self-pay | Admitting: Obstetrics & Gynecology

## 2018-06-29 ENCOUNTER — Other Ambulatory Visit (HOSPITAL_COMMUNITY)
Admission: RE | Admit: 2018-06-29 | Discharge: 2018-06-29 | Disposition: A | Discharge status: Home / Self Care | Payer: Medicare Other | Source: Ambulatory Visit | Attending: Obstetrics & Gynecology | Admitting: Obstetrics & Gynecology

## 2018-06-29 ENCOUNTER — Other Ambulatory Visit: Payer: Self-pay

## 2018-06-29 VITALS — BP 136/81 | HR 81 | Resp 16 | Ht 67.0 in | Wt 156.0 lb

## 2018-06-29 DIAGNOSIS — N920 Excessive and frequent menstruation with regular cycle: Secondary | ICD-10-CM

## 2018-06-29 DIAGNOSIS — Z1151 Encounter for screening for human papillomavirus (HPV): Secondary | ICD-10-CM | POA: Diagnosis not present

## 2018-06-29 DIAGNOSIS — D631 Anemia in chronic kidney disease: Secondary | ICD-10-CM

## 2018-06-29 DIAGNOSIS — Z124 Encounter for screening for malignant neoplasm of cervix: Secondary | ICD-10-CM

## 2018-06-29 DIAGNOSIS — Z0000 Encounter for general adult medical examination without abnormal findings: Secondary | ICD-10-CM | POA: Diagnosis present

## 2018-06-29 DIAGNOSIS — N186 End stage renal disease: Secondary | ICD-10-CM

## 2018-06-29 LAB — TYPE AND SCREEN
ABO/RH(D): O NEG
Antibody Screen: NEGATIVE
Unit division: 0

## 2018-06-29 LAB — BPAM RBC
Blood Product Expiration Date: 202007012359
ISSUE DATE / TIME: 202006100938
Unit Type and Rh: 9500

## 2018-06-29 MED ORDER — MEGESTROL ACETATE 40 MG PO TABS: 40.0000 mg | ORAL_TABLET | Freq: Two times a day (BID) | ORAL | 5 refills | Status: DC

## 2018-06-29 NOTE — Progress Notes (Signed)
Subjective:    Sara Meza is a 43 y.o. married P3 (26, 61, and 52 yo kids)  female who presents for an annual exam. She reports reports heavy periods, lasting now 5 days (was 7-8 days prior to starting dialysis in 2/20). The heaviness in general started 15 years ago. She has previously used OCPs which helped initially. She has had a BTL. Her HBG was 6.5 on 06/27/18 and she got transfused 1 unit yesterday. She received 2 units last month.  The patient is sexually active. GYN screening history: last pap: was normal. The patient wears seatbelts: yes. The patient participates in regular exercise: yes. Has the patient ever been transfused or tattooed?: not asked. The patient reports that there is not domestic violence in her life.   Menstrual History: OB History    Gravida  3   Para  3   Term      Preterm      AB      Living        SAB      TAB      Ectopic      Multiple      Live Births              Menarche age: 1 Patient's last menstrual period was 06/19/2018.    The following portions of the patient's history were reviewed and updated as appropriate: allergies, current medications, past family history, past medical history, past social history, past surgical history and problem list.  Review of Systems Pertinent items are noted in HPI.   Married for 27 years Homemaker On disability, on dialysis for kidney failure (uncertain etiology)   Objective:    BP 136/81   Pulse 81   Resp 16   Ht 5\' 7"  (1.702 m)   Wt 156 lb (70.8 kg)   LMP 06/19/2018   BMI 24.43 kg/m   General Appearance:    Alert, cooperative, no distress, appears stated age  Head:    Normocephalic, without obvious abnormality, atraumatic  Eyes:    PERRL, conjunctiva/corneas clear, EOM's intact, fundi    benign, both eyes  Ears:    Normal TM's and external ear canals, both ears  Nose:   Nares normal, septum midline, mucosa normal, no drainage    or sinus tenderness  Throat:   Lips, mucosa, and  tongue normal; teeth and gums normal  Neck:   Supple, symmetrical, trachea midline, no adenopathy;    thyroid:  no enlargement/tenderness/nodules; no carotid   bruit or JVD  Back:     Symmetric, no curvature, ROM normal, no CVA tenderness  Lungs:     Clear to auscultation bilaterally, respirations unlabored  Chest Wall:    No tenderness or deformity   Heart:    Regular rate and rhythm, S1 and S2 normal, no murmur, rub   or gallop  Breast Exam:    No tenderness, masses, or nipple abnormality  Abdomen:     Soft, non-tender, bowel sounds active all four quadrants,    no masses, no organomegaly  Genitalia:    Normal female without lesion, discharge or tenderness, normal size and shape, anteverted, mobile, non-tender, normal adnexal exam      Extremities:   Extremities normal, atraumatic, no cyanosis or edema  Pulses:   2+ and symmetric all extremities  Skin:   Skin color, texture, turgor normal, no rashes or lesions  Lymph nodes:   Cervical, supraclavicular, and axillary nodes normal  Neurologic:   CNII-XII intact, normal  strength, sensation and reflexes    throughout  .    Assessment:    Healthy female exam.  TSH, CBC, gyn u/s  menorrhagia Plan:    Thin prep Pap smear. with cotesting Megace 40 mg BID Virtual in a week

## 2018-06-30 LAB — CBC
HCT: 23.2 % — ABNORMAL LOW (ref 35.0–45.0)
Hemoglobin: 8 g/dL — ABNORMAL LOW (ref 11.7–15.5)
MCH: 32.9 pg (ref 27.0–33.0)
MCHC: 34.5 g/dL (ref 32.0–36.0)
MCV: 95.5 fL (ref 80.0–100.0)
MPV: 10.2 fL (ref 7.5–12.5)
Platelets: 245 10*3/uL (ref 140–400)
RBC: 2.43 10*6/uL — ABNORMAL LOW (ref 3.80–5.10)
RDW: 14.1 % (ref 11.0–15.0)
WBC: 7.7 10*3/uL (ref 3.8–10.8)

## 2018-06-30 LAB — TSH: TSH: 2.08 mIU/L

## 2018-07-04 LAB — CYTOLOGY - PAP
Diagnosis: NEGATIVE
HPV: NOT DETECTED

## 2018-07-06 ENCOUNTER — Ambulatory Visit (INDEPENDENT_AMBULATORY_CARE_PROVIDER_SITE_OTHER): Payer: Medicare Other

## 2018-07-06 ENCOUNTER — Other Ambulatory Visit: Payer: Self-pay

## 2018-07-06 DIAGNOSIS — Z992 Dependence on renal dialysis: Secondary | ICD-10-CM | POA: Diagnosis not present

## 2018-07-06 DIAGNOSIS — D631 Anemia in chronic kidney disease: Secondary | ICD-10-CM

## 2018-07-06 DIAGNOSIS — N920 Excessive and frequent menstruation with regular cycle: Secondary | ICD-10-CM

## 2018-07-06 DIAGNOSIS — N186 End stage renal disease: Secondary | ICD-10-CM

## 2018-07-18 DIAGNOSIS — N186 End stage renal disease: Secondary | ICD-10-CM | POA: Insufficient documentation

## 2018-07-24 ENCOUNTER — Encounter: Payer: Self-pay | Admitting: Physician Assistant

## 2018-07-24 ENCOUNTER — Ambulatory Visit (INDEPENDENT_AMBULATORY_CARE_PROVIDER_SITE_OTHER): Payer: Medicare Other | Admitting: Physician Assistant

## 2018-07-24 DIAGNOSIS — R6881 Early satiety: Secondary | ICD-10-CM

## 2018-07-24 DIAGNOSIS — R1013 Epigastric pain: Secondary | ICD-10-CM | POA: Diagnosis not present

## 2018-07-24 DIAGNOSIS — K521 Toxic gastroenteritis and colitis: Secondary | ICD-10-CM | POA: Diagnosis not present

## 2018-07-24 HISTORY — DX: Toxic gastroenteritis and colitis: K52.1

## 2018-07-24 MED ORDER — PANTOPRAZOLE SODIUM 40 MG PO TBEC: 40.0000 mg | DELAYED_RELEASE_TABLET | Freq: Every day | ORAL | 0 refills | Status: DC

## 2018-07-24 NOTE — Progress Notes (Signed)
Virtual Visit via Video Note  I connected with Sara Meza on 07/24/18 at  2:40 PM EDT by a video enabled telemedicine application and verified that I am speaking with the correct person using two identifiers.   I discussed the limitations of evaluation and management by telemedicine and the availability of in person appointments. The patient expressed understanding and agreed to proceed.  History of Present Illness: HPI:                                                                Sara Meza is a 43 y.o. female   CC: abdominal pain  Patient with PMH ESRD on dialysis, hyperparathyroidism, IDA, and vit d defiiency presents with severe upper abdominal pain, x 2 weeks Reports while sitting at dialysis she felt a sudden really horrible pain in her epigastric area. She attributed this to her dialysis treatment and tried to ignore it. But pain has been gradually worsening, to the point that she was too ill to attend dialysis today. Pain is described as a "poking needle pain" from her epigastric area down to just above the umbilicus and radiating into her mid-back. Endorses abdominal bloating, intermittent nausea and vomiting (resolved), early satiety (only able to eat a fistful of food at a time), and frequent loose bowel movements.  She states she has chronic diarrhea 10-12 times per day since starting dialysis. Denies hematochezia/melena, hematemesis.  Denies dysphagia, odynophagia. Denies unintended weight loss, fever, chills, malaise.  She was prescribed Protonix 40 mg x 2 weeks back in February for abdominal pain and states this was helpful.  Last night she had a single episode of lower abdominal cramping with a small amount of blood on the tissue paper when she used the bathroom. She is not sure if the bleeding was rectal or vaginal. Symptoms have since resolved.   Past Medical History:  Diagnosis Date  . Dialysis patient ([place])   . Hemolacria   . History of gestational diabetes  06/29/2012  . Hypertension    Past Surgical History:  Procedure Laterality Date  . AV FISTULA PLACEMENT Right 03/02/2018   Procedure: ARTERIOVENOUS (AV) FISTULA CREATION;  Surgeon: Waynetta Sandy, MD;  Location: [facility];  Service: Vascular;  Laterality: Right;  . ENDOVENOUS ABLATION SAPHENOUS VEIN W/ LASER    . IR FLUORO GUIDE CV LINE RIGHT  02/24/2018  . IR US GUIDE VASC ACCESS RIGHT  02/24/2018  . LAPAROSCOPY    . TUBAL LIGATION     Social History   Tobacco Use  . Smoking status: Never Smoker  . Smokeless tobacco: Never Used  Substance Use Topics  . Alcohol use: Not Currently   family history includes Colon cancer in her sister; Diabetes in her father and mother; Hypertension in her father and mother; Wilm's tumor in her sister.    ROS: negative except as noted in the HPI  Medications: Current Outpatient Medications  Medication Sig Dispense Refill  . cinacalcet (SENSIPAR) 30 MG tablet Take 30 mg by mouth daily.    . ferric citrate (AURYXIA) 1 GM 210 MG(Fe) tablet Take 3 tablets (630 mg total) by mouth 3 (three) times daily with meals. 270 tablet 0  . lidocaine-prilocaine (EMLA) cream APPLY SMALL AMOUNT TO ACCESS SITE (AVF) 3 TIMES A WEEK 1  HOUR BEFORE DIALYSIS. COVER WITH OCCLUSIVE DRESSING (SARAN WRAP)    . megestrol (MEGACE) 40 MG tablet Take 1 tablet (40 mg total) by mouth 2 (two) times daily. 60 tablet 5  . multivitamin (RENA-VIT) TABS tablet Take 1 tablet by mouth daily. as directed    . Vitamin D, Ergocalciferol, (DRISDOL) 1.25 MG (50000 UT) CAPS capsule Take 1 capsule (50,000 Units total) by mouth every 7 (seven) days. 5 capsule 0  . pantoprazole (PROTONIX) 40 MG tablet Take 1 tablet (40 mg total) by mouth daily before breakfast for 14 days. 30 tablet 0   No current facility-administered medications for this visit.    Allergies  Allergen Reactions  . Lisinopril Other (See Comments)    irregular menses       Objective:  There were no vitals taken for this  visit. Wt Readings from Last 3 Encounters:  06/29/18 156 lb (70.8 kg)  06/27/18 156 lb (70.8 kg)  04/20/18 156 lb (70.8 kg)   BP Readings from Last 3 Encounters:  06/29/18 136/81  06/28/18 116/64  06/27/18 118/68   Gen:  Alert, appears tired and to be in discomfort, not toxic-appearing, no acute distress, appropriate for age 28: head normocephalic without obvious abnormality, conjunctiva and cornea clear, trachea midline Pulm: Normal work of breathing, normal phonation Neuro: alert and oriented x 3 Psych: cooperative, speech is articulate, normal rate and volume; thought processes clear and goal-directed, normal judgment, good insight   Recent Results (from the past 2160 hour(s))  Type and screen [place]     Status: None   Collection Time: 05/15/18 12:12 PM  Result Value Ref Range   ABO/RH(D) O NEG    Antibody Screen NEG    Sample Expiration 05/18/2018    Unit Number [id number]    Blood Component Type RCLI PHER 2    Unit division 00    Status of Unit ISSUED,FINAL    Transfusion Status OK TO TRANSFUSE    Crossmatch Result      Compatible Performed at [facility], [place] [address] [place], [place] [address]    Unit Number [id number]    Blood Component Type RED CELLS,LR    Unit division 00    Status of Unit ISSUED,FINAL    Transfusion Status OK TO TRANSFUSE    Crossmatch Result Compatible   BPAM RBC     Status: None   Collection Time: 05/15/18 12:12 PM  Result Value Ref Range   ISSUE DATE / TIME 086761950932    Blood Product Unit Number [id number]    PRODUCT CODE A2505L97    Unit Type and Rh 9500    Blood Product Expiration Date 673419379024    ISSUE DATE / TIME 097353299242    Blood Product Unit Number [id number]    PRODUCT CODE E0336V00    Unit Type and Rh 9500    Blood Product Expiration Date 892119417408   Comprehensive metabolic panel     Status: Abnormal   Collection Time: 05/15/18 12:13 PM  Result  Value Ref Range   Sodium 139 135 - 145 mmol/L   Potassium 3.7 3.5 - 5.1 mmol/L   Chloride 99 98 - 111 mmol/L   CO2 29 22 - 32 mmol/L   Glucose, Bld 161 (H) 70 - 99 mg/dL   BUN 17 6 - 20 mg/dL   Creatinine, Ser 4.23 (H) 0.44 - 1.00 mg/dL   Calcium 8.2 (L) 8.9 - 10.3 mg/dL   Total Protein 7.4 6.5 - 8.1 g/dL  Albumin 4.0 3.5 - 5.0 g/dL   AST 18 15 - 41 U/L   ALT 19 0 - 44 U/L   Alkaline Phosphatase 123 38 - 126 U/L   Total Bilirubin 0.5 0.3 - 1.2 mg/dL   GFR calc non Af Amer 12 (L) >60 mL/min   GFR calc Af Amer 14 (L) >60 mL/min   Anion gap 11 5 - 15    Comment: Performed at [facility], [place] [address], [place], [place] [address]  CBC     Status: Abnormal   Collection Time: 05/15/18 12:13 PM  Result Value Ref Range   WBC 5.0 4.0 - 10.5 K/uL   RBC 1.79 (L) 3.87 - 5.11 MIL/uL   Hemoglobin 6.0 (LL) 12.0 - 15.0 g/dL    Comment: This critical result has verified and been called to K.HALL by Marella Bile on 04 27 2020 at 1237, and has been read back.    HCT 17.3 (L) 36.0 - 46.0 %   MCV 96.6 80.0 - 100.0 fL   MCH 33.5 26.0 - 34.0 pg   MCHC 34.7 30.0 - 36.0 g/dL   RDW 14.0 11.5 - 15.5 %   Platelets 178 150 - 400 K/uL   nRBC 0.0 0.0 - 0.2 %    Comment: Performed at [facility], [place] [address] [place], [place] [address]  I-Stat beta hCG blood, ED     Status: None   Collection Time: 05/15/18 12:19 PM  Result Value Ref Range   I-stat hCG, quantitative <5.0 <5 mIU/mL   Comment 3            Comment:   GEST. AGE      CONC.  (mIU/mL)   <=1 WEEK        5 - 50     2 WEEKS       50 - 500     3 WEEKS       100 - 10,000     4 WEEKS     1,000 - 30,000        FEMALE AND NON-PREGNANT FEMALE:     LESS THAN 5 mIU/mL   Prepare RBC     Status: None   Collection Time: 05/15/18 12:56 PM  Result Value Ref Range   Order Confirmation      ORDER PROCESSED BY BLOOD BANK Performed at [facility], [place] [address], [place], [place] [address]    Hemoglobin and hematocrit, blood     Status: Abnormal   Collection Time: 05/15/18  5:24 PM  Result Value Ref Range   Hemoglobin 6.6 (LL) 12.0 - 15.0 g/dL    Comment: This critical result has verified and been called to JESSEE,B. RN by Sarita Bottom on 04 27 2020 at 1754, and has been read back. CRITICAL RESULT VERIFIED   HCT 18.8 (L) 36.0 - 46.0 %    Comment: Performed at [facility], [place] [address], [place],  [address]  Type and screen [place]     Status: None   Collection Time: 05/23/18  8:25 AM  Result Value Ref Range   ABO/RH(D) O NEG    Antibody Screen NEG    Sample Expiration 05/26/2018    Unit Number [id number]    Blood Component Type RED CELLS,LR    Unit division 00    Status of Unit ISSUED,FINAL    Transfusion Status OK TO TRANSFUSE    Crossmatch Result  Compatible Performed at [facility], [place] [address], [place], [place] [address]   Prepare RBC     Status: None   Collection Time: 05/23/18  8:25 AM  Result Value Ref Range   Order Confirmation      ORDER PROCESSED BY BLOOD BANK Performed at [place] [address] [place], [place] [address]   BPAM RBC     Status: None   Collection Time: 05/23/18  8:25 AM  Result Value Ref Range   ISSUE DATE / TIME [id number]    Blood Product Unit Number [id number]    PRODUCT CODE E0336V00    Unit Type and Rh 9500    Blood Product Expiration Date [id number]   CBC with Differential     Status: Abnormal   Collection Time: 06/27/18 12:34 PM  Result Value Ref Range   WBC 5.8 4.0 - 10.5 K/uL   RBC 1.99 (L) 3.87 - 5.11 MIL/uL   Hemoglobin 6.5 (LL) 12.0 - 15.0 g/dL    Comment: REPEATED TO VERIFY THIS CRITICAL RESULT HAS VERIFIED AND BEEN CALLED TO CINDY ROUGHGARDIN RN BY SHANNON FLEMING ON 06 09 2020 AT 7017, AND HAS BEEN READ BACK.     HCT 19.6 (L) 36.0 - 46.0 %   MCV 98.5 80.0 - 100.0 fL   MCH 32.7 26.0 - 34.0 pg   MCHC  33.2 30.0 - 36.0 g/dL   RDW 15.2 11.5 - 15.5 %   Platelets 212 150 - 400 K/uL   nRBC 0.0 0.0 - 0.2 %   Neutrophils Relative % 70 %   Neutro Abs 4.1 1.7 - 7.7 K/uL   Lymphocytes Relative 20 %   Lymphs Abs 1.1 0.7 - 4.0 K/uL   Monocytes Relative 7 %   Monocytes Absolute 0.4 0.1 - 1.0 K/uL   Eosinophils Relative 2 %   Eosinophils Absolute 0.1 0.0 - 0.5 K/uL   Basophils Relative 1 %   Basophils Absolute 0.0 0.0 - 0.1 K/uL   Immature Granulocytes 0 %   Abs Immature Granulocytes 0.02 0.00 - 0.07 K/uL    Comment: Performed at [facility] [address] [place], [place] [address]  Basic metabolic panel     Status: Abnormal   Collection Time: 06/27/18 12:34 PM  Result Value Ref Range   Sodium 137 135 - 145 mmol/L   Potassium 5.0 3.5 - 5.1 mmol/L   Chloride 101 98 - 111 mmol/L   CO2 25 22 - 32 mmol/L   Glucose, Bld 125 (H) 70 - 99 mg/dL   BUN 37 (H) 6 - 20 mg/dL   Creatinine, Ser 7.50 (H) 0.44 - 1.00 mg/dL   Calcium 8.7 (L) 8.9 - 10.3 mg/dL   GFR calc non Af Amer 6 (L) >60 mL/min   GFR calc Af Amer 7 (L) >60 mL/min   Anion gap 11 5 - 15    Comment: Performed at [facility] [address], [place], [place] [address]  Novel Coronavirus,NAA,(SEND-OUT TO REF LAB - TAT 24-48 hrs); Hosp Order     Status: None   Collection Time: 06/27/18 12:39 PM   Specimen: Nasopharyngeal Swab; Respiratory  Result Value Ref Range   SARS-CoV-2, NAA NOT DETECTED NOT DETECTED    Comment: (NOTE) This test was developed and its performance characteristics determined by [employer]. This test has not been FDA cleared or approved. This test has been authorized by FDA under an Emergency Use Authorization (EUA). This test is only  authorized for the duration of time the declaration that circumstances exist justifying the authorization of the emergency use of in vitro diagnostic tests for detection of SARS-CoV-2 virus and/or diagnosis of COVID-19 infection under section 564(b)(1) of the  Act, 21 U.S.C. 315QMG-8(Q)(7), unless the authorization is terminated or revoked sooner. When diagnostic testing is negative, the possibility of a false negative result should be considered in the context of a patient's recent exposures and the presence of clinical signs and symptoms consistent with COVID-19. An individual without symptoms of COVID-19 and who is not shedding SARS-CoV-2 virus would expect to have a negative (not detected) result in this assay. Performed  At: [facility] [place], [place] [id number] Rush Farmer MD [id number]    Coronavirus Source NASOPHARYNGEAL     Comment: Performed at [facility], [place] [address], [place], [place] [address]  Type and screen [place]     Status: None   Collection Time: 06/28/18  8:30 AM  Result Value Ref Range   ABO/RH(D) O NEG    Antibody Screen NEG    Sample Expiration 07/01/2018,2359    Unit Number [id number]    Blood Component Type RED CELLS,LR    Unit division 00    Status of Unit ISSUED,FINAL    Transfusion Status OK TO TRANSFUSE    Crossmatch Result      Compatible Performed at [facility], [place] [address] [place], [place] [address]   Prepare RBC     Status: None   Collection Time: 06/28/18  8:30 AM  Result Value Ref Range   Order Confirmation      ORDER PROCESSED BY BLOOD BANK Performed at [facility] [address], [place], [place] [address]   BPAM RBC     Status: None   Collection Time: 06/28/18  8:30 AM  Result Value Ref Range   ISSUE DATE / TIME [id number]    Blood Product Unit Number [id number]    PRODUCT CODE D4081K48    Unit Type and Rh 9500    Blood Product Expiration Date [id number]   Cytology - PAP( [place])     Status: None   Collection Time: 06/29/18 12:00 AM  Result Value Ref Range   Adequacy      Satisfactory for evaluation  endocervical/transformation zone component PRESENT.    Diagnosis      NEGATIVE FOR INTRAEPITHELIAL LESIONS OR MALIGNANCY.   HPV NOT DETECTED     Comment: Normal Reference Range - NOT Detected   Material Submitted CervicoVaginal Pap [ThinPrep Imaged]   TSH     Status: None   Collection Time: 06/29/18  3:57 PM  Result Value Ref Range   TSH 2.08 mIU/L    Comment:           Reference Range .           > or = 20 Years  0.40-4.50 .                Pregnancy Ranges           First trimester    0.26-2.66           Second trimester   0.55-2.73           Third trimester    0.43-2.91   CBC     Status: Abnormal   Collection Time: 06/29/18  3:57 PM  Result Value Ref Range   WBC 7.7 3.8 - 10.8 Thousand/uL  RBC 2.43 (L) 3.80 - 5.10 Million/uL   Hemoglobin 8.0 (L) 11.7 - 15.5 g/dL   HCT 23.2 (L) 35.0 - 45.0 %   MCV 95.5 80.0 - 100.0 fL   MCH 32.9 27.0 - 33.0 pg   MCHC 34.5 32.0 - 36.0 g/dL   RDW 14.1 11.0 - 15.0 %   Platelets 245 140 - 400 Thousand/uL   MPV 10.2 7.5 - 12.5 fL     No results found for this or any previous visit (from the past 72 hour(s)). No results found.    Assessment and Plan: 43 y.o. female with   .Shirle was seen today for abdominal pain.  Diagnoses and all orders for this visit:  Abdominal pain, acute, epigastric -     pantoprazole (PROTONIX) 40 MG tablet; Take 1 tablet (40 mg total) by mouth daily before breakfast for 14 days.  Early satiety -     pantoprazole (PROTONIX) 40 MG tablet; Take 1 tablet (40 mg total) by mouth daily before breakfast for 14 days.  Dyspepsia -     pantoprazole (PROTONIX) 40 MG tablet; Take 1 tablet (40 mg total) by mouth daily before breakfast for 14 days.  Diarrhea due to drug    Contacted [facility] and ordered hepatic function and lipase to be drawn at dialysis on Wednesday Starting Protonix 40 mg QD x 2 weeks Counseled on bland/GERD diet Follow-up in office in 3 days or sooner if needed   Follow Up Instructions:    I discussed the assessment and treatment  plan with the patient. The patient was provided an opportunity to ask questions and all were answered. The patient agreed with the plan and demonstrated an understanding of the instructions.   The patient was advised to call back or seek an in-person evaluation if the symptoms worsen or if the condition fails to improve as anticipated.  I provided 15 minutes of non-face-to-face time during this encounter.   [name], [place]

## 2018-07-26 LAB — BASIC METABOLIC PANEL: Potassium: 5.9 — AB (ref 3.4–5.3)

## 2018-07-26 LAB — HEPATIC FUNCTION PANEL
ALT: 28 (ref 7–35)
AST: 13 (ref 13–35)
Alkaline Phosphatase: 79 (ref 25–125)

## 2018-07-27 ENCOUNTER — Ambulatory Visit (INDEPENDENT_AMBULATORY_CARE_PROVIDER_SITE_OTHER): Payer: Medicare Other | Admitting: Obstetrics & Gynecology

## 2018-07-27 ENCOUNTER — Other Ambulatory Visit: Payer: Self-pay

## 2018-07-27 ENCOUNTER — Telehealth: Payer: Self-pay | Admitting: Physician Assistant

## 2018-07-27 ENCOUNTER — Encounter: Payer: Self-pay | Admitting: Obstetrics & Gynecology

## 2018-07-27 VITALS — BP 143/84 | HR 81 | Resp 16 | Ht 67.0 in | Wt 156.0 lb

## 2018-07-27 DIAGNOSIS — N83202 Unspecified ovarian cyst, left side: Secondary | ICD-10-CM | POA: Diagnosis not present

## 2018-07-27 DIAGNOSIS — Z01812 Encounter for preprocedural laboratory examination: Secondary | ICD-10-CM

## 2018-07-27 DIAGNOSIS — Z3202 Encounter for pregnancy test, result negative: Secondary | ICD-10-CM | POA: Diagnosis not present

## 2018-07-27 DIAGNOSIS — N920 Excessive and frequent menstruation with regular cycle: Secondary | ICD-10-CM

## 2018-07-27 LAB — POCT URINE PREGNANCY: Preg Test, Ur: NEGATIVE

## 2018-07-27 MED ORDER — MEDROXYPROGESTERONE ACETATE 150 MG/ML IM SUSP: 150.0000 mg | INTRAMUSCULAR | 5 refills | Status: DC

## 2018-07-27 NOTE — Telephone Encounter (Signed)
Can you please call [facility] [phone] regarding patient's lab results. I gave a verbal order to her dialysis nurse to add-on hepatic function and lipase labs

## 2018-07-27 NOTE — Progress Notes (Signed)
   Subjective:    Patient ID: Sara Meza, female    DOB: Mar 21, 1975, 43 y.o.   MRN: [id number]  HPI 43 yo married P3 here for follow up of her u/s results. This was done as part of the work up for her heavy periods and anemia. She had a blood transfusion right before I saw her last month and then again afterwards. Her hbg was 6.5 and then 8. She is followed by her nephrologist as she is getting dialysis. She will be changing to peritoneal dialysis at home since she is having side effects from the other type. She has a port placement tomorrow.  Her u/s was essentially normal with regards to the uterus (15 mm lining). A 3 cm left ovarian cyst was seen with a thin septation. I prescribed megace 40 mg BID for her heavy periods. She reports that it initially helped but she started bleeding again today.  She used depo provera in the distant past for about 3 years and had very little bleeding with that contraceptive.  Review of Systems She has had a BTL.    Objective:   Physical Exam Breathing, conversing, and ambulating normally Well nourished, well hydrated White female, no apparent distress Abd- benign Cervix- parous, no lesions normal size and shape, anteverted, mobile, non-tender, normal adnexal exam Her pubic arch is amenable to a TVH if hysterectomy becomes necessary.    UPT negative, consent signed, time out done Cervix prepped with betadine and grasped with a single tooth tenaculum Uterus sounded to 9 cm Pipelle used for 2 passes with a moderate amount of tissue obtained. She tolerated the procedure well.   Assessment & Plan:  Menorrhagia with anemia - This is certainly compounded by her kidney disease. Since the megace is not helping, I have offered a trial of depo provera, Mirena IUD, and endometrial ablation. At this time she wants to try depo provera.  Left ovarian cyst- check CA-125  Await [facility] pathology

## 2018-07-28 ENCOUNTER — Encounter: Payer: Self-pay | Admitting: Physician Assistant

## 2018-07-28 LAB — CA 125: CA 125: 20 U/mL (ref <35)

## 2018-07-28 LAB — LIPASE: Lipase: 81

## 2018-07-28 NOTE — Telephone Encounter (Signed)
Lab received and placed in [place] box. -EH/RMA

## 2018-07-28 NOTE — Telephone Encounter (Signed)
Spoke charge nurse labs are being faxed over. -EH/RMA

## 2018-07-31 ENCOUNTER — Other Ambulatory Visit: Payer: Self-pay | Admitting: Obstetrics & Gynecology

## 2018-07-31 DIAGNOSIS — N83202 Unspecified ovarian cyst, left side: Secondary | ICD-10-CM

## 2018-08-04 ENCOUNTER — Ambulatory Visit: Payer: Medicare Other | Admitting: Physician Assistant

## 2018-08-07 ENCOUNTER — Ambulatory Visit (INDEPENDENT_AMBULATORY_CARE_PROVIDER_SITE_OTHER): Payer: Medicare Other | Admitting: *Deleted

## 2018-08-07 ENCOUNTER — Other Ambulatory Visit: Payer: Self-pay

## 2018-08-07 DIAGNOSIS — Z30013 Encounter for initial prescription of injectable contraceptive: Secondary | ICD-10-CM | POA: Diagnosis not present

## 2018-08-07 MED ORDER — MEDROXYPROGESTERONE ACETATE 150 MG/ML IM SUSP
150.0000 mg | Freq: Once | INTRAMUSCULAR | Status: AC
  Administered 2018-08-07: 150 mg via INTRAMUSCULAR

## 2018-08-14 DIAGNOSIS — R102 Pelvic and perineal pain: Secondary | ICD-10-CM | POA: Insufficient documentation

## 2018-08-15 ENCOUNTER — Telehealth: Payer: Self-pay

## 2018-08-15 NOTE — Telephone Encounter (Signed)
Dr.Dove would like pt to be seen this week rather than her appt on 8/11. I spoke with pt on the phone and discussed Dr.Dove's schedule with her this week at the other offices and none of the times or days worked for pt. Message sent to Dr.Dove letting her know and to see what she would like [place] to do.

## 2018-08-18 ENCOUNTER — Telehealth: Payer: Self-pay | Admitting: Obstetrics & Gynecology

## 2018-08-18 NOTE — Telephone Encounter (Signed)
I called to let her know that I will try to work her in at any of my offices or she can see any of my partners at [facility].

## 2018-08-28 ENCOUNTER — Ambulatory Visit (INDEPENDENT_AMBULATORY_CARE_PROVIDER_SITE_OTHER): Payer: Medicare Other | Admitting: Obstetrics & Gynecology

## 2018-08-28 ENCOUNTER — Other Ambulatory Visit: Payer: Self-pay

## 2018-08-28 ENCOUNTER — Encounter: Payer: Self-pay | Admitting: Obstetrics & Gynecology

## 2018-08-28 VITALS — BP 150/89 | HR 89 | Resp 16 | Ht 67.0 in | Wt 153.0 lb

## 2018-08-28 DIAGNOSIS — N920 Excessive and frequent menstruation with regular cycle: Secondary | ICD-10-CM

## 2018-08-28 NOTE — Progress Notes (Signed)
   Subjective:    Patient ID: Sara Meza, female    DOB: [birth year], 43 y.o.   MRN: [id number]  HPI 43 yo married P3 here for continued DUB. She has tried megace and depo provera and still bleeds, sometimes heavily. She has dialysis QOD. She reports that her last HBG was around 8.   Review of Systems     Objective:   Physical Exam Breathing, conversing, and ambulating normally Well nourished, well hydrated female, no apparent distress Abd- benign     Assessment & Plan:  DUB with normal u/s- offered ablation versus TVH. She opts for ablation. I sent Drema Balzarine a message to schedule this soon.

## 2018-09-05 DIAGNOSIS — Z4932 Encounter for adequacy testing for peritoneal dialysis: Secondary | ICD-10-CM | POA: Insufficient documentation

## 2018-09-05 DIAGNOSIS — Z01818 Encounter for other preprocedural examination: Secondary | ICD-10-CM | POA: Insufficient documentation

## 2018-09-05 DIAGNOSIS — E789 Disorder of lipoprotein metabolism, unspecified: Secondary | ICD-10-CM | POA: Insufficient documentation

## 2018-09-05 DIAGNOSIS — Z79899 Other long term (current) drug therapy: Secondary | ICD-10-CM | POA: Insufficient documentation

## 2018-09-05 DIAGNOSIS — R82998 Other abnormal findings in urine: Secondary | ICD-10-CM | POA: Insufficient documentation

## 2018-09-10 ENCOUNTER — Other Ambulatory Visit: Payer: Self-pay | Admitting: Physician Assistant

## 2018-09-10 DIAGNOSIS — R6881 Early satiety: Secondary | ICD-10-CM

## 2018-09-10 DIAGNOSIS — R1013 Epigastric pain: Secondary | ICD-10-CM

## 2018-09-11 NOTE — Progress Notes (Addendum)
[place], [place] [address] Phone: [phone] Fax: [phone]      Your procedure is scheduled on August 26  Report to [facility] Main Entrance "A" at 0930 A.M., and check in at the Admitting office.  Call this number if you have problems the morning of surgery:  [phone]  Call [phone] if you have any questions prior to your surgery date Monday-Friday 8am-4pm    Remember:  Do not eat after midnight the night before your surgery  You may drink clear liquids until 0830 am the morning of your surgery.   Clear liquids allowed are: Water, Non-Citrus Juices (without pulp), Carbonated Beverages, Clear Tea, Black Coffee Only, and Gatorade    Take these medicines the morning of surgery with A SIP OF WATER  pantoprazole (PROTONIX)   7 days prior to surgery STOP taking any Aspirin (unless otherwise instructed by your surgeon), Aleve, Naproxen, Ibuprofen, Motrin, Advil, Goody's, BC's, all herbal medications, fish oil, and all vitamins.    The Morning of Surgery  Do not wear jewelry, make-up or nail polish.  Do not wear lotions, powders, or perfumes/colognes, or deodorant  Do not shave 48 hours prior to surgery.  Men may shave face and neck.  Do not bring valuables to the hospital.  [facility] is not responsible for any belongings or valuables.  If you are a smoker, DO NOT Smoke 24 hours prior to surgery IF you wear a CPAP at night please bring your mask, tubing, and machine the morning of surgery   Remember that you must have someone to transport you home after your surgery, and remain with you for 24 hours if you are discharged the same day.   Contacts, glasses, hearing aids, dentures or bridgework may not be worn into surgery.    Leave your suitcase in the car.  After surgery it may be brought to your room.  For patients admitted to the hospital, discharge time will be determined by  your treatment team.  Patients discharged the day of surgery will not be allowed to drive home.    Special instructions:   [facility]- Preparing For Surgery  Before surgery, you can play an important role. Because skin is not sterile, your skin needs to be as free of germs as possible. You can reduce the number of germs on your skin by washing with CHG (chlorahexidine gluconate) Soap before surgery.  CHG is an antiseptic cleaner which kills germs and bonds with the skin to continue killing germs even after washing.    Oral Hygiene is also important to reduce your risk of infection.  Remember - BRUSH YOUR TEETH THE MORNING OF SURGERY WITH YOUR REGULAR TOOTHPASTE  Please do not use if you have an allergy to CHG or antibacterial soaps. If your skin becomes reddened/irritated stop using the CHG.  Do not shave (including legs and underarms) for at least 48 hours prior to first CHG shower. It is OK to shave your face.  Please follow these instructions carefully.   1. Shower the NIGHT BEFORE SURGERY and the MORNING OF SURGERY with CHG Soap.   2. If you chose to wash your hair, wash your hair first as usual with your normal shampoo.  3. After you shampoo, rinse your hair and body thoroughly to remove the shampoo.  4. Use CHG as you would any other liquid soap. You can apply CHG directly to the skin and wash  gently with a scrungie or a clean washcloth.   5. Apply the CHG Soap to your body ONLY FROM THE NECK DOWN.  Do not use on open wounds or open sores. Avoid contact with your eyes, ears, mouth and genitals (private parts). Wash Face and genitals (private parts)  with your normal soap.   6. Wash thoroughly, paying special attention to the area where your surgery will be performed.  7. Thoroughly rinse your body with warm water from the neck down.  8. DO NOT shower/wash with your normal soap after using and rinsing off the CHG Soap.  9. Pat yourself dry with a CLEAN TOWEL.  10. Wear CLEAN  PAJAMAS to bed the night before surgery, wear comfortable clothes the morning of surgery  11. Place CLEAN SHEETS on your bed the night of your first shower and DO NOT SLEEP WITH PETS.    Day of Surgery:  Do not apply any deodorants/lotions. Please shower the morning of surgery with the CHG soap  Please wear clean clothes to the hospital/surgery center.   Remember to brush your teeth WITH YOUR REGULAR TOOTHPASTE.   Please read over the following fact sheets that you were given.

## 2018-09-12 ENCOUNTER — Encounter (HOSPITAL_COMMUNITY): Payer: Self-pay

## 2018-09-12 ENCOUNTER — Other Ambulatory Visit: Payer: Self-pay

## 2018-09-12 ENCOUNTER — Encounter (HOSPITAL_COMMUNITY)
Admission: RE | Admit: 2018-09-12 | Discharge: 2018-09-12 | Disposition: A | Discharge status: Home / Self Care | Payer: Medicare Other | Source: Ambulatory Visit | Attending: Obstetrics & Gynecology | Admitting: Obstetrics & Gynecology

## 2018-09-12 ENCOUNTER — Other Ambulatory Visit (HOSPITAL_COMMUNITY)
Admission: RE | Admit: 2018-09-12 | Discharge: 2018-09-12 | Disposition: A | Discharge status: Home / Self Care | Payer: Medicare Other | Source: Ambulatory Visit | Attending: Obstetrics & Gynecology | Admitting: Obstetrics & Gynecology

## 2018-09-12 DIAGNOSIS — Z8489 Family history of other specified conditions: Secondary | ICD-10-CM | POA: Diagnosis not present

## 2018-09-12 DIAGNOSIS — Z833 Family history of diabetes mellitus: Secondary | ICD-10-CM | POA: Diagnosis not present

## 2018-09-12 DIAGNOSIS — N186 End stage renal disease: Secondary | ICD-10-CM | POA: Diagnosis not present

## 2018-09-12 DIAGNOSIS — Z79899 Other long term (current) drug therapy: Secondary | ICD-10-CM | POA: Diagnosis not present

## 2018-09-12 DIAGNOSIS — N840 Polyp of corpus uteri: Secondary | ICD-10-CM | POA: Diagnosis not present

## 2018-09-12 DIAGNOSIS — Z888 Allergy status to other drugs, medicaments and biological substances status: Secondary | ICD-10-CM | POA: Diagnosis not present

## 2018-09-12 DIAGNOSIS — N938 Other specified abnormal uterine and vaginal bleeding: Secondary | ICD-10-CM | POA: Diagnosis present

## 2018-09-12 DIAGNOSIS — Z20828 Contact with and (suspected) exposure to other viral communicable diseases: Secondary | ICD-10-CM | POA: Insufficient documentation

## 2018-09-12 DIAGNOSIS — Z01812 Encounter for preprocedural laboratory examination: Secondary | ICD-10-CM | POA: Insufficient documentation

## 2018-09-12 DIAGNOSIS — Z8249 Family history of ischemic heart disease and other diseases of the circulatory system: Secondary | ICD-10-CM | POA: Diagnosis not present

## 2018-09-12 DIAGNOSIS — Z793 Long term (current) use of hormonal contraceptives: Secondary | ICD-10-CM | POA: Diagnosis not present

## 2018-09-12 DIAGNOSIS — K219 Gastro-esophageal reflux disease without esophagitis: Secondary | ICD-10-CM | POA: Diagnosis not present

## 2018-09-12 DIAGNOSIS — Z800 Family history of malignant neoplasm of digestive organs: Secondary | ICD-10-CM | POA: Diagnosis not present

## 2018-09-12 DIAGNOSIS — Z992 Dependence on renal dialysis: Secondary | ICD-10-CM | POA: Diagnosis not present

## 2018-09-12 DIAGNOSIS — D631 Anemia in chronic kidney disease: Secondary | ICD-10-CM | POA: Diagnosis not present

## 2018-09-12 DIAGNOSIS — H04219 Epiphora due to excess lacrimation, unspecified lacrimal gland: Secondary | ICD-10-CM | POA: Diagnosis not present

## 2018-09-12 DIAGNOSIS — I120 Hypertensive chronic kidney disease with stage 5 chronic kidney disease or end stage renal disease: Secondary | ICD-10-CM | POA: Diagnosis not present

## 2018-09-12 DIAGNOSIS — Z8632 Personal history of gestational diabetes: Secondary | ICD-10-CM | POA: Diagnosis not present

## 2018-09-12 HISTORY — DX: Anemia, unspecified: D64.9

## 2018-09-12 HISTORY — DX: Gastro-esophageal reflux disease without esophagitis: K21.9

## 2018-09-12 LAB — CBC
HCT: 32 % — ABNORMAL LOW (ref 36.0–46.0)
Hemoglobin: 10.5 g/dL — ABNORMAL LOW (ref 12.0–15.0)
MCH: 32 pg (ref 26.0–34.0)
MCHC: 32.8 g/dL (ref 30.0–36.0)
MCV: 97.6 fL (ref 80.0–100.0)
Platelets: 285 10*3/uL (ref 150–400)
RBC: 3.28 MIL/uL — ABNORMAL LOW (ref 3.87–5.11)
RDW: 15.7 % — ABNORMAL HIGH (ref 11.5–15.5)
WBC: 7.9 10*3/uL (ref 4.0–10.5)
nRBC: 0 % (ref 0.0–0.2)

## 2018-09-12 LAB — COMPREHENSIVE METABOLIC PANEL
ALT: 14 U/L (ref 0–44)
AST: 12 U/L — ABNORMAL LOW (ref 15–41)
Albumin: 4.3 g/dL (ref 3.5–5.0)
Alkaline Phosphatase: 90 U/L (ref 38–126)
Anion gap: 18 — ABNORMAL HIGH (ref 5–15)
BUN: 111 mg/dL — ABNORMAL HIGH (ref 6–20)
CO2: 19 mmol/L — ABNORMAL LOW (ref 22–32)
Calcium: 10.5 mg/dL — ABNORMAL HIGH (ref 8.9–10.3)
Chloride: 97 mmol/L — ABNORMAL LOW (ref 98–111)
Creatinine, Ser: 19.14 mg/dL — ABNORMAL HIGH (ref 0.44–1.00)
GFR calc Af Amer: 2 mL/min — ABNORMAL LOW (ref >60)
GFR calc non Af Amer: 2 mL/min — ABNORMAL LOW (ref >60)
Glucose, Bld: 96 mg/dL (ref 70–99)
Potassium: 4.9 mmol/L (ref 3.5–5.1)
Sodium: 134 mmol/L — ABNORMAL LOW (ref 135–145)
Total Bilirubin: 0.6 mg/dL (ref 0.3–1.2)
Total Protein: 8.3 g/dL — ABNORMAL HIGH (ref 6.5–8.1)

## 2018-09-12 LAB — SARS CORONAVIRUS 2 (TAT 6-24 HRS): SARS Coronavirus 2: NEGATIVE

## 2018-09-12 NOTE — Progress Notes (Signed)
Anesthesia Chart Review:  Case: [id number] Date/Time: 09/13/18 1115   Procedure: DILATATION AND CURETTAGE /HYSTEROSCOPY WITH MINERVA ABLATION (N/A ) - Rep will be here confirmed on 09/06/18 CS   Anesthesia type: Choice   Pre-op diagnosis: AUB   Location: [facility] OR ROOM 07 / [place] [facility]   Surgeon: Emily Filbert, MD      DISCUSSION: Patient is a 43 year old female scheduled for the above procedure.  History includes never smoker, HTN, ESRD (diagnoased 02/2018 due to HTN vs undiagnosed glomerular process, [place] with bilateral atrophic kidneys; s/p right IJ [facility] 02/24/18; s/p right brachiocephalic AVF 09/10/21), GERD, anemia.   Per PAT RN, patient currently going to HD center daily to train for home hemodialysis.    She is for ISTAT and pregnancy test on the day of surgery. 09/12/18 presurgical COVID-19 test in process. Anesthesia team to evaluate on the day of surgery.    VS: BP (!) 150/86   Pulse 89   Temp 36.5 C   Resp 18   Ht 5\' 7"  (1.702 m)   Wt 65.5 kg   LMP 08/28/2018   SpO2 100%   BMI 22.62 kg/m   PROVIDERS: [name], PA-C is PCP Undergoing renal transplant evaluation through [facility]   LABS: Preoperative labs noted. She is for ISTAT4 or equivalent on the day of surgery. (all labs ordered are listed, but only abnormal results are displayed)  Labs Reviewed  CBC - Abnormal; Notable for the following components:      Result Value   RBC 3.28 (*)    Hemoglobin 10.5 (*)    HCT 32.0 (*)    RDW 15.7 (*)    All other components within normal limits  COMPREHENSIVE METABOLIC PANEL - Abnormal; Notable for the following components:   Sodium 134 (*)    Chloride 97 (*)    CO2 19 (*)    BUN 111 (*)    Creatinine, Ser 19.14 (*)    Calcium 10.5 (*)    Total Protein 8.3 (*)    AST 12 (*)    GFR calc non Af Amer 2 (*)    GFR calc Af Amer 2 (*)    Anion gap 18 (*)    All other components within normal limits    IMAGES: DG ABD 2V/1V CHEST 03/01/18: IMPRESSION: Chest: No  radiographic evidence of acute cardiopulmonary disease. Right IJ tunneled hemodialysis catheter. Abdomen: Nonobstructive bowel gas pattern with no significant stool burden.   EKG: 05/15/18: Sinus rhythm Left anterior fascicular block Borderline prolonged QT interval Baseline wander in lead(s) V6 Confirmed by Veryl Speak [phone]) on 05/15/2018 1:16:18 PM  CV:  Past Medical History:  Diagnosis Date  . Anemia   . Chronic kidney disease 02/22/2018   ESRD-dialysis, training to do dialysis at home. Currently does dialysis every day.   . Dialysis patient ([place])   . GERD (gastroesophageal reflux disease)   . Hemolacria   . History of gestational diabetes 06/29/2012  . Hypertension     Past Surgical History:  Procedure Laterality Date  . AV FISTULA PLACEMENT Right 03/02/2018   Procedure: ARTERIOVENOUS (AV) FISTULA CREATION;  Surgeon: Waynetta Sandy, MD;  Location: [place];  Service: Vascular;  Laterality: Right;  . ENDOVENOUS ABLATION SAPHENOUS VEIN W/ LASER    . IR FLUORO GUIDE CV LINE RIGHT  02/24/2018  . IR US GUIDE VASC ACCESS RIGHT  02/24/2018  . LAPAROSCOPY    . TUBAL LIGATION      MEDICATIONS: . b  complex vitamins capsule  . cinacalcet (SENSIPAR) 30 MG tablet  . ferric citrate (AURYXIA) 1 GM 210 MG(Fe) tablet  . medroxyPROGESTERone (DEPO-PROVERA) 150 MG/ML injection  . pantoprazole (PROTONIX) 40 MG tablet  . Vitamin D, Ergocalciferol, (DRISDOL) 1.25 MG (50000 UT) CAPS capsule   No current facility-administered medications for this encounter.     Myra Gianotti, PA-C Surgical Short Stay/Anesthesiology [facility] Phone [phone] [facility] Phone [phone] 09/12/2018 3:42 PM

## 2018-09-12 NOTE — Anesthesia Preprocedure Evaluation (Addendum)
Anesthesia Evaluation  Patient identified by MRN, date of birth, ID band Patient awake    Reviewed: Allergy & Precautions, NPO status , Patient's Chart, lab work & pertinent test results  Airway Mallampati: II  TM Distance: >3 FB Neck ROM: Full    Dental no notable dental hx.    Pulmonary neg pulmonary ROS,    Pulmonary exam normal breath sounds clear to auscultation       Cardiovascular (-) hypertensionnegative cardio ROS Normal cardiovascular exam Rhythm:Regular Rate:Normal     Neuro/Psych negative neurological ROS  negative psych ROS   GI/Hepatic Neg liver ROS, GERD  Medicated and Controlled,  Endo/Other  negative endocrine ROS  Renal/GU ESRF and DialysisRenal disease  negative genitourinary   Musculoskeletal negative musculoskeletal ROS (+)   Abdominal   Peds negative pediatric ROS (+)  Hematology  (+) anemia ,   Anesthesia Other Findings   Reproductive/Obstetrics negative OB ROS                            Anesthesia Physical Anesthesia Plan  ASA: II  Anesthesia Plan: General   Post-op Pain Management:    Induction: Intravenous  PONV Risk Score and Plan: 4 or greater and Ondansetron, Dexamethasone, Midazolam, Scopolamine patch - Pre-op and Treatment may vary due to age or medical condition  Airway Management Planned: LMA  Additional Equipment:   Intra-op Plan:   Post-operative Plan: Extubation in OR  Informed Consent: I have reviewed the patients History and Physical, chart, labs and discussed the procedure including the risks, benefits and alternatives for the proposed anesthesia with the patient or authorized representative who has indicated his/her understanding and acceptance.     Dental advisory given  Plan Discussed with: CRNA  Anesthesia Plan Comments: (PAT note written 09/12/2018 by Myra Gianotti, PA-C. )      Anesthesia Quick Evaluation

## 2018-09-12 NOTE — Progress Notes (Signed)
PCP - Dr. Nelson Chimes Cardiologist - denies  Chest x-ray - N/A EKG - 05/16/18 Stress Test - denies ECHO - denies Cardiac Cath - denies  Sleep Study - denies CPAP - denies  Blood Thinner Instructions:N/A Aspirin Instructions:N/A  Anesthesia review: No  Patient denies shortness of breath, fever, cough and chest pain at PAT appointment   Patient verbalized understanding of instructions that were given to them at the PAT appointment. Patient was also instructed that they will need to review over the PAT instructions again at home before surgery.    Coronavirus Screening  Have you experienced the following symptoms:  Cough yes/no: No Fever (>100.84F)  yes/no: No Runny nose yes/no: No Sore throat yes/no: No Difficulty breathing/shortness of breath  yes/no: No  Have you or a family member traveled in the last 14 days and where? yes/no: No   If the patient indicates "YES" to the above questions, their PAT will be rescheduled to limit the exposure to others and, the surgeon will be notified. THE PATIENT WILL NEED TO BE ASYMPTOMATIC FOR 14 DAYS.   If the patient is not experiencing any of these symptoms, the PAT nurse will instruct them to NOT bring anyone with them to their appointment since they may have these symptoms or traveled as well.   Please remind your patients and families that hospital visitation restrictions are in effect and the importance of the restrictions.

## 2018-09-13 ENCOUNTER — Ambulatory Visit (HOSPITAL_COMMUNITY): Payer: Medicare Other | Admitting: Certified Registered Nurse Anesthetist

## 2018-09-13 ENCOUNTER — Encounter (HOSPITAL_COMMUNITY)
Admission: RE | Disposition: A | Discharge status: Home / Self Care | Payer: Self-pay | Source: Home / Self Care | Attending: Obstetrics & Gynecology

## 2018-09-13 ENCOUNTER — Encounter (HOSPITAL_COMMUNITY): Payer: Self-pay

## 2018-09-13 ENCOUNTER — Ambulatory Visit (HOSPITAL_COMMUNITY)
Admission: RE | Admit: 2018-09-13 | Discharge: 2018-09-13 | Disposition: A | Discharge status: Home / Self Care | Payer: Medicare Other | Attending: Obstetrics & Gynecology | Admitting: Obstetrics & Gynecology

## 2018-09-13 DIAGNOSIS — Z793 Long term (current) use of hormonal contraceptives: Secondary | ICD-10-CM | POA: Insufficient documentation

## 2018-09-13 DIAGNOSIS — H04219 Epiphora due to excess lacrimation, unspecified lacrimal gland: Secondary | ICD-10-CM | POA: Insufficient documentation

## 2018-09-13 DIAGNOSIS — N840 Polyp of corpus uteri: Secondary | ICD-10-CM

## 2018-09-13 DIAGNOSIS — Z8489 Family history of other specified conditions: Secondary | ICD-10-CM | POA: Insufficient documentation

## 2018-09-13 DIAGNOSIS — N938 Other specified abnormal uterine and vaginal bleeding: Secondary | ICD-10-CM | POA: Diagnosis not present

## 2018-09-13 DIAGNOSIS — Z8632 Personal history of gestational diabetes: Secondary | ICD-10-CM | POA: Insufficient documentation

## 2018-09-13 DIAGNOSIS — Z992 Dependence on renal dialysis: Secondary | ICD-10-CM | POA: Insufficient documentation

## 2018-09-13 DIAGNOSIS — Z8249 Family history of ischemic heart disease and other diseases of the circulatory system: Secondary | ICD-10-CM | POA: Insufficient documentation

## 2018-09-13 DIAGNOSIS — K219 Gastro-esophageal reflux disease without esophagitis: Secondary | ICD-10-CM | POA: Insufficient documentation

## 2018-09-13 DIAGNOSIS — Z833 Family history of diabetes mellitus: Secondary | ICD-10-CM | POA: Insufficient documentation

## 2018-09-13 DIAGNOSIS — D631 Anemia in chronic kidney disease: Secondary | ICD-10-CM | POA: Insufficient documentation

## 2018-09-13 DIAGNOSIS — I120 Hypertensive chronic kidney disease with stage 5 chronic kidney disease or end stage renal disease: Secondary | ICD-10-CM | POA: Insufficient documentation

## 2018-09-13 DIAGNOSIS — Z79899 Other long term (current) drug therapy: Secondary | ICD-10-CM | POA: Insufficient documentation

## 2018-09-13 DIAGNOSIS — Z888 Allergy status to other drugs, medicaments and biological substances status: Secondary | ICD-10-CM | POA: Insufficient documentation

## 2018-09-13 DIAGNOSIS — Z800 Family history of malignant neoplasm of digestive organs: Secondary | ICD-10-CM | POA: Insufficient documentation

## 2018-09-13 DIAGNOSIS — N186 End stage renal disease: Secondary | ICD-10-CM | POA: Insufficient documentation

## 2018-09-13 DIAGNOSIS — Z20828 Contact with and (suspected) exposure to other viral communicable diseases: Secondary | ICD-10-CM | POA: Insufficient documentation

## 2018-09-13 HISTORY — PX: HYSTEROSCOPY WITH D & C: SHX1775

## 2018-09-13 LAB — POCT PREGNANCY, URINE: Preg Test, Ur: NEGATIVE

## 2018-09-13 SURGERY — DILATATION AND CURETTAGE /HYSTEROSCOPY
Anesthesia: General | Site: Vagina

## 2018-09-13 MED ORDER — PHENYLEPHRINE 40 MCG/ML (10ML) SYRINGE FOR IV PUSH (FOR BLOOD PRESSURE SUPPORT)
PREFILLED_SYRINGE | INTRAVENOUS | Status: DC | PRN
  Administered 2018-09-13: 40 ug via INTRAVENOUS
  Administered 2018-09-13: 80 ug via INTRAVENOUS
  Administered 2018-09-13: 40 ug via INTRAVENOUS
  Administered 2018-09-13: 80 ug via INTRAVENOUS
  Administered 2018-09-13: 40 ug via INTRAVENOUS

## 2018-09-13 MED ORDER — BUPIVACAINE HCL (PF) 0.5 % IJ SOLN
INTRAMUSCULAR | Status: DC | PRN
  Administered 2018-09-13: 20 mL

## 2018-09-13 MED ORDER — FENTANYL CITRATE (PF) 100 MCG/2ML IJ SOLN
25.0000 ug | INTRAMUSCULAR | Status: DC | PRN
  Administered 2018-09-13: 13:00:00 50 ug via INTRAVENOUS

## 2018-09-13 MED ORDER — PROPOFOL 10 MG/ML IV BOLUS
INTRAVENOUS | Status: AC
  Filled 2018-09-13: qty 20

## 2018-09-13 MED ORDER — LACTATED RINGERS IV SOLN: INTRAVENOUS | Status: DC

## 2018-09-13 MED ORDER — ROCURONIUM BROMIDE 10 MG/ML (PF) SYRINGE
PREFILLED_SYRINGE | INTRAVENOUS | Status: AC
  Filled 2018-09-13: qty 10

## 2018-09-13 MED ORDER — PROPOFOL 10 MG/ML IV BOLUS
INTRAVENOUS | Status: DC | PRN
  Administered 2018-09-13: 160 mg via INTRAVENOUS

## 2018-09-13 MED ORDER — SILVER NITRATE-POT NITRATE 75-25 % EX MISC
CUTANEOUS | Status: DC | PRN
  Administered 2018-09-13: 2 via TOPICAL

## 2018-09-13 MED ORDER — SODIUM CHLORIDE 0.9 % IV SOLN
INTRAVENOUS | Status: DC
  Administered 2018-09-13 (×2): via INTRAVENOUS

## 2018-09-13 MED ORDER — MIDAZOLAM HCL 2 MG/2ML IJ SOLN
INTRAMUSCULAR | Status: AC
  Filled 2018-09-13: qty 2

## 2018-09-13 MED ORDER — ONDANSETRON HCL 4 MG/2ML IJ SOLN
INTRAMUSCULAR | Status: DC | PRN
  Administered 2018-09-13: 4 mg via INTRAVENOUS

## 2018-09-13 MED ORDER — PHENYLEPHRINE 40 MCG/ML (10ML) SYRINGE FOR IV PUSH (FOR BLOOD PRESSURE SUPPORT)
PREFILLED_SYRINGE | INTRAVENOUS | Status: AC
  Filled 2018-09-13: qty 10

## 2018-09-13 MED ORDER — ONDANSETRON HCL 4 MG/2ML IJ SOLN
INTRAMUSCULAR | Status: AC
  Filled 2018-09-13: qty 2

## 2018-09-13 MED ORDER — MIDAZOLAM HCL 2 MG/2ML IJ SOLN
INTRAMUSCULAR | Status: DC | PRN
  Administered 2018-09-13: 2 mg via INTRAVENOUS

## 2018-09-13 MED ORDER — METOCLOPRAMIDE HCL 5 MG/ML IJ SOLN: 10.0000 mg | Freq: Once | INTRAMUSCULAR | Status: DC | PRN

## 2018-09-13 MED ORDER — MEPERIDINE HCL 25 MG/ML IJ SOLN: 6.2500 mg | INTRAMUSCULAR | Status: DC | PRN

## 2018-09-13 MED ORDER — LIDOCAINE 2% (20 MG/ML) 5 ML SYRINGE
INTRAMUSCULAR | Status: DC | PRN
  Administered 2018-09-13: 100 mg via INTRAVENOUS

## 2018-09-13 MED ORDER — FENTANYL CITRATE (PF) 250 MCG/5ML IJ SOLN
INTRAMUSCULAR | Status: DC | PRN
  Administered 2018-09-13: 25 ug via INTRAVENOUS

## 2018-09-13 MED ORDER — OXYCODONE HCL 5 MG PO TABS: 5.0000 mg | ORAL_TABLET | ORAL | 0 refills | Status: DC | PRN

## 2018-09-13 MED ORDER — LIDOCAINE 2% (20 MG/ML) 5 ML SYRINGE
INTRAMUSCULAR | Status: AC
  Filled 2018-09-13: qty 5

## 2018-09-13 MED ORDER — OXYCODONE HCL 5 MG PO TABS
ORAL_TABLET | ORAL | Status: AC
  Administered 2018-09-13: 5 mg via ORAL
  Filled 2018-09-13: qty 1

## 2018-09-13 MED ORDER — FENTANYL CITRATE (PF) 100 MCG/2ML IJ SOLN
INTRAMUSCULAR | Status: AC
  Administered 2018-09-13: 50 ug via INTRAVENOUS
  Filled 2018-09-13: qty 2

## 2018-09-13 MED ORDER — FENTANYL CITRATE (PF) 250 MCG/5ML IJ SOLN
INTRAMUSCULAR | Status: AC
  Filled 2018-09-13: qty 5

## 2018-09-13 MED ORDER — OXYCODONE HCL 5 MG PO TABS
5.0000 mg | ORAL_TABLET | Freq: Once | ORAL | Status: AC
  Administered 2018-09-13: 14:00:00 5 mg via ORAL

## 2018-09-13 MED ORDER — DEXAMETHASONE SODIUM PHOSPHATE 10 MG/ML IJ SOLN
INTRAMUSCULAR | Status: DC | PRN
  Administered 2018-09-13: 10 mg via INTRAVENOUS

## 2018-09-13 MED ORDER — BUPIVACAINE HCL (PF) 0.5 % IJ SOLN
INTRAMUSCULAR | Status: AC
  Filled 2018-09-13: qty 30

## 2018-09-13 MED ORDER — DEXAMETHASONE SODIUM PHOSPHATE 10 MG/ML IJ SOLN
INTRAMUSCULAR | Status: AC
  Filled 2018-09-13: qty 1

## 2018-09-13 SURGICAL SUPPLY — 12 items
BIPOLAR CUTTING LOOP 21FR (ELECTRODE)
DILATOR CANAL MILEX (MISCELLANEOUS) IMPLANT
GLOVE BIO SURGEON STRL SZ 6.5 (GLOVE) ×2 IMPLANT
GLOVE BIOGEL PI IND STRL 7.0 (GLOVE) ×1 IMPLANT
GLOVE BIOGEL PI INDICATOR 7.0 (GLOVE) ×1
GOWN STRL REUS W/ TWL LRG LVL3 (GOWN DISPOSABLE) ×2 IMPLANT
GOWN STRL REUS W/TWL LRG LVL3 (GOWN DISPOSABLE) ×2
HANDPIECE ABLA MINERVA ENDO (MISCELLANEOUS) ×2 IMPLANT
LOOP CUTTING BIPOLAR 21FR (ELECTRODE) IMPLANT
PACK VAGINAL MINOR WOMEN LF (CUSTOM PROCEDURE TRAY) ×2 IMPLANT
PAD OB MATERNITY 4.3X12.25 (PERSONAL CARE ITEMS) ×2 IMPLANT
TOWEL GREEN STERILE FF (TOWEL DISPOSABLE) ×4 IMPLANT

## 2018-09-13 NOTE — Transfer of Care (Signed)
Immediate Anesthesia Transfer of Care Note  Patient: Sara Meza  Procedure(s) Performed: DILATATION AND CURETTAGE /HYSTEROSCOPY WITH MINERVA ABLATION (N/A Vagina )  Patient Location: PACU  Anesthesia Type:General  Level of Consciousness: awake, alert  and oriented  Airway & Oxygen Therapy: Patient Spontanous Breathing  Post-op Assessment: Report given to RN and Post -op Vital signs reviewed and stable  Post vital signs: Reviewed and stable  Last Vitals:  Vitals Value Taken Time  BP 150/89 09/13/18 1248  Temp    Pulse 89 09/13/18 1250  Resp 16 09/13/18 1250  SpO2 100 % 09/13/18 1250  Vitals shown include unvalidated device data.  Last Pain:  Vitals:   09/13/18 0846  PainSc: 0-No pain      Patients Stated Pain Goal: 2 (88/67/73 7366)  Complications: No apparent anesthesia complications

## 2018-09-13 NOTE — Op Note (Signed)
09/13/2018  12:38 PM  PATIENT:  Sara Meza  43 y.o. female  PRE-OPERATIVE DIAGNOSIS:  Abnormal uterine bleeding  POST-OPERATIVE DIAGNOSIS:  Abnormal Uterine Bleeding  PROCEDURE:  Procedure(s) with comments: DILATATION AND CURETTAGE /HYSTEROSCOPY WITH MINERVA ABLATION (N/A) - Rep will be here confirmed on 09/06/18 CS  SURGEON:  Surgeon(s) and Role:    * Laneshia Pina, Wilhemina Cash, MD - Primary  ANESTHESIA:   local and general  EBL:  minimal  BLOOD ADMINISTERED:none  DRAINS: none   LOCAL MEDICATIONS USED:  MARCAINE     SPECIMEN:  Source of Specimen:  uterine curettings  DISPOSITION OF SPECIMEN:  PATHOLOGY  COUNTS:  YES  TOURNIQUET:  * No tourniquets in log *  DICTATION: .Dragon Dictation  PLAN OF CARE: Discharge to home after PACU  PATIENT DISPOSITION:  PACU - hemodynamically stable.   Delay start of Pharmacological VTE agent (>24hrs) due to surgical blood loss or risk of bleeding: not applicable  The risks, benefits, and alternatives of surgery were explained, understood, and accepted. All questions were answered. Consents were signed. In the operating room general anesthesia was applied without complication, and she was placed in the dorsal lithotomy position. Her vagina was prepped and draped in the usual sterile fashion.  A bimanual exam revealed a normal size and shape, retroverted mobile uterus. Her adnexa were nonenlarged. Her pubic arch is fair if she needs a vaginal hysterectomy in the future. A speculum was placed and a single-tooth tenaculum was used to grasp the posterior lip of her cervix. A total of 20 mL of 0.5% Marcaine was used to perform a paracervical block. Her uterus sounded to 8 cm and her cervical length was 4 cm. Her cervix was carefully and slowly dilated to accommodate a small curette. A curettage was done in all quadrants and the fundus of the uterus. A small amount of tissue was obtained.  I then proceeded with the Minerva procedure. The procedure proceeded as  expected. There was no bleeding noted at the end of the case. She was taken to the recovery room after being extubated. She tolerated the procedure well.

## 2018-09-13 NOTE — Discharge Instructions (Signed)
Dilation and Curettage or Vacuum Curettage, Care After This sheet gives you information about how to care for yourself after your procedure. Your health care provider may also give you more specific instructions. If you have problems or questions, contact your health care provider. What can I expect after the procedure? After your procedure, it is common to have:  Mild pain or cramping.  Some vaginal bleeding or spotting. These may last for up to 2 weeks after your procedure. Follow these instructions at home: Activity   Do not drive or use heavy machinery while taking prescription pain medicine.  Avoid driving for the first 24 hours after your procedure.  Take frequent, short walks, followed by rest periods, throughout the day. Ask your health care provider what activities are safe for you. After 1-2 days, you may be able to return to your normal activities.  Do not lift anything heavier than 10 lb (4.5 kg) until your health care provider approves.  For at least 2 weeks, or as long as told by your health care provider, do not: ? Douche. ? Use tampons. ? Have sexual intercourse. General instructions   Take over-the-counter and prescription medicines only as told by your health care provider. This is especially important if you take blood thinning medicine.  Do not take baths, swim, or use a hot tub until your health care provider approves. Take showers instead of baths.  Wear compression stockings as told by your health care provider. These stockings help to prevent blood clots and reduce swelling in your legs.  It is your responsibility to get the results of your procedure. Ask your health care provider, or the department performing the procedure, when your results will be ready.  Keep all follow-up visits as told by your health care provider. This is important. Contact a health care provider if:  You have severe cramps that get worse or that do not get better with  medicine.  You have severe abdominal pain.  You cannot drink fluids without vomiting.  You develop pain in a different area of your pelvis.  You have bad-smelling vaginal discharge.  You have a rash. Get help right away if:  You have vaginal bleeding that soaks more than one sanitary pad in 1 hour, for 2 hours in a row.  You pass large blood clots from your vagina.  You have a fever that is above 100.1F (38.0C).  Your abdomen feels very tender or hard.  You have chest pain.  You have shortness of breath.  You cough up blood.  You feel dizzy or light-headed.  You faint.  You have pain in your neck or shoulder area. This information is not intended to replace advice given to you by your health care provider. Make sure you discuss any questions you have with your health care provider. Document Released: 01/02/2000 Document Revised: 12/17/2016 Document Reviewed: 08/07/2015 Elsevier Patient Education  2020 [employer].

## 2018-09-13 NOTE — Progress Notes (Signed)
Discussed with Anesth. about abnormal labs drawn yesterday. Per Dr. Marcell Barlow do not need I-stat.

## 2018-09-13 NOTE — Anesthesia Procedure Notes (Signed)
Procedure Name: LMA Insertion Date/Time: 09/13/2018 12:16 PM Performed by: Harden Mo, CRNA Pre-anesthesia Checklist: Patient identified, Emergency Drugs available, Suction available and Patient being monitored Patient Re-evaluated:Patient Re-evaluated prior to induction Oxygen Delivery Method: Circle System Utilized Preoxygenation: Pre-oxygenation with 100% oxygen Induction Type: IV induction LMA: LMA inserted LMA Size: 4.0 Number of attempts: 1 Airway Equipment and Method: Bite block Placement Confirmation: positive ETCO2 Tube secured with: Tape Dental Injury: Teeth and Oropharynx as per pre-operative assessment

## 2018-09-13 NOTE — H&P (Signed)
Sara Meza is a 43 yo married P3 (27, 80, and 5 yo kids) here for continued DUB. She has tried megace and depo provera and still bleeds, sometimes heavily. Her pap smear was normal 07/06/18. She has peritoneal dialysis QD. She reports that her last HBG was around 10.5     Patient's last menstrual period was 08/28/2018.    Past Medical History:  Diagnosis Date  . Anemia   . Chronic kidney disease 02/22/2018   ESRD-dialysis, training to do dialysis at home. Currently does dialysis every day.   . Dialysis patient ([place])   . GERD (gastroesophageal reflux disease)   . Hemolacria   . History of gestational diabetes 06/29/2012  . Hypertension     Past Surgical History:  Procedure Laterality Date  . AV FISTULA PLACEMENT Right 03/02/2018   Procedure: ARTERIOVENOUS (AV) FISTULA CREATION;  Surgeon: Waynetta Sandy, MD;  Location: [place];  Service: Vascular;  Laterality: Right;  . ENDOVENOUS ABLATION SAPHENOUS VEIN W/ LASER    . IR FLUORO GUIDE CV LINE RIGHT  02/24/2018  . IR US GUIDE VASC ACCESS RIGHT  02/24/2018  . LAPAROSCOPY    . TUBAL LIGATION      Family History  Problem Relation Age of Onset  . Diabetes Father   . Hypertension Father   . Diabetes Mother   . Hypertension Mother   . Colon cancer Sister   . Wilm's tumor Sister     Social History:  reports that she has never smoked. She has never used smokeless tobacco. She reports previous alcohol use. She reports that she does not use drugs.  Allergies:  Allergies  Allergen Reactions  . Lisinopril Other (See Comments)    irregular menses    Medications Prior to Admission  Medication Sig Dispense Refill Last Dose  . b complex vitamins capsule Take 1 capsule by mouth daily.    09/12/2018 at Unknown time  . cinacalcet (SENSIPAR) 30 MG tablet Take 30 mg by mouth daily.   09/12/2018 at Unknown time  . ferric citrate (AURYXIA) 1 GM 210 MG(Fe) tablet Take 3 tablets (630 mg total) by mouth 3 (three) times daily with  meals. 270 tablet 0 09/12/2018 at Unknown time  . pantoprazole (PROTONIX) 40 MG tablet TAKE 1 TABLET BY MOUTH DAILY BEFORE BREAKFAST. 90 tablet 0 09/12/2018 at Unknown time  . medroxyPROGESTERone (DEPO-PROVERA) 150 MG/ML injection Inject 1 mL (150 mg total) into the muscle every 3 (three) months. (Patient not taking: Reported on 09/07/2018) 1 mL 5 Not Taking at Unknown time  . Vitamin D, Ergocalciferol, (DRISDOL) 1.25 MG (50000 UT) CAPS capsule Take 1 capsule (50,000 Units total) by mouth every 7 (seven) days. (Patient not taking: Reported on 09/07/2018) 5 capsule 0 Not Taking at Unknown time    ROS  Blood pressure (!) 154/83, pulse 87, temperature 98.7 F (37.1 C), resp. rate 17, last menstrual period 08/28/2018, SpO2 100 %. Physical Exam Breathing, conversing, and ambulating normally Well nourished, well hydrated White female, no apparent distress She has had a BTL.  Results for orders placed or performed during the hospital encounter of 09/13/18 (from the past 24 hour(s))  Pregnancy, urine POC     Status: None   Collection Time: 09/13/18  8:39 AM  Result Value Ref Range   Preg Test, Ur NEGATIVE NEGATIVE    No results found.  Assessment/Plan: DUB with anemia- plan for d&c and endometrial ablation  She understands the risks of surgery, including, but not to infection, bleeding, DVTs, damage  to bowel, bladder, ureters. She wishes to proceed.     Emily Filbert 09/13/2018, 11:57 AM

## 2018-09-13 NOTE — Anesthesia Postprocedure Evaluation (Signed)
Anesthesia Post Note  Patient: Sara Meza  Procedure(s) Performed: DILATATION AND CURETTAGE WITH MINERVA ABLATION (N/A Vagina )     Patient location during evaluation: PACU Anesthesia Type: General Level of consciousness: awake and alert Pain management: pain level controlled Vital Signs Assessment: post-procedure vital signs reviewed and stable Respiratory status: spontaneous breathing, nonlabored ventilation, respiratory function stable and patient connected to nasal cannula oxygen Cardiovascular status: blood pressure returned to baseline and stable Postop Assessment: no apparent nausea or vomiting Anesthetic complications: no    Last Vitals:  Vitals:   09/13/18 1305 09/13/18 1350  BP: (!) 161/92   Pulse: 81   Resp: 19   Temp:  36.6 C  SpO2: 99%     Last Pain:  Vitals:   09/13/18 1350  PainSc: 2                  Montez Hageman

## 2018-09-15 ENCOUNTER — Encounter (HOSPITAL_COMMUNITY): Payer: Self-pay | Admitting: Obstetrics & Gynecology

## 2018-10-25 ENCOUNTER — Encounter: Payer: No Typology Code available for payment source | Admitting: Obstetrics & Gynecology

## 2018-10-25 ENCOUNTER — Ambulatory Visit: Payer: Medicare Other

## 2018-11-01 ENCOUNTER — Other Ambulatory Visit: Payer: Self-pay

## 2018-11-01 ENCOUNTER — Ambulatory Visit: Payer: Medicare Other

## 2018-11-01 ENCOUNTER — Ambulatory Visit (INDEPENDENT_AMBULATORY_CARE_PROVIDER_SITE_OTHER): Payer: Medicare Other

## 2018-11-01 DIAGNOSIS — Z1231 Encounter for screening mammogram for malignant neoplasm of breast: Secondary | ICD-10-CM | POA: Diagnosis not present

## 2018-11-06 ENCOUNTER — Other Ambulatory Visit: Payer: Self-pay

## 2018-11-06 ENCOUNTER — Ambulatory Visit (INDEPENDENT_AMBULATORY_CARE_PROVIDER_SITE_OTHER): Payer: Medicare Other | Admitting: Obstetrics & Gynecology

## 2018-11-06 VITALS — BP 156/100 | HR 83 | Ht 67.0 in | Wt 149.0 lb

## 2018-11-06 DIAGNOSIS — Z9889 Other specified postprocedural states: Secondary | ICD-10-CM

## 2018-11-06 NOTE — Progress Notes (Signed)
   Subjective:    Patient ID: Sara Meza, female    DOB: 14-Mar-1975, 43 y.o.   MRN: [id number]  HPI  43 yo lady here for a post op visit. She had a d&c and Minerva ablation on 09/13/18. Her pathology was benign. She had some spotting for a month and when her period was due, she only had a little bleeding, only needed to wear a panty liner. She is very happy with the outcome. She has no complaints.  Review of Systems     Objective:   Physical Exam Breathing, conversing, and ambulating normally Abd- benign     Assessment & Plan:  Post op- doing well Ovarian cyst on u/s - follow up u/s next Wednesday

## 2018-11-07 DIAGNOSIS — Z9889 Other specified postprocedural states: Secondary | ICD-10-CM | POA: Insufficient documentation

## 2018-11-07 DIAGNOSIS — D631 Anemia in chronic kidney disease: Secondary | ICD-10-CM | POA: Insufficient documentation

## 2018-11-07 HISTORY — DX: Other specified postprocedural states: Z98.890

## 2018-11-08 ENCOUNTER — Ambulatory Visit (INDEPENDENT_AMBULATORY_CARE_PROVIDER_SITE_OTHER): Payer: Medicare Other

## 2018-11-08 ENCOUNTER — Other Ambulatory Visit: Payer: Self-pay

## 2018-11-08 DIAGNOSIS — N83202 Unspecified ovarian cyst, left side: Secondary | ICD-10-CM

## 2018-11-10 ENCOUNTER — Other Ambulatory Visit: Payer: Self-pay | Admitting: *Deleted

## 2019-02-09 ENCOUNTER — Emergency Department (INDEPENDENT_AMBULATORY_CARE_PROVIDER_SITE_OTHER)
Admission: EM | Admit: 2019-02-09 | Discharge: 2019-02-09 | Disposition: A | Discharge status: Home / Self Care | Payer: Medicare Other | Source: Home / Self Care

## 2019-02-09 ENCOUNTER — Other Ambulatory Visit: Payer: Self-pay

## 2019-02-09 DIAGNOSIS — B349 Viral infection, unspecified: Secondary | ICD-10-CM

## 2019-02-09 DIAGNOSIS — Z20822 Contact with and (suspected) exposure to covid-19: Secondary | ICD-10-CM

## 2019-02-09 LAB — POC SARS CORONAVIRUS 2 AG -  ED: SARS Coronavirus 2 Ag: NEGATIVE

## 2019-02-09 NOTE — ED Triage Notes (Signed)
Pt c/o covid sxs since yesterday. Husband tested pos Tues. Sore throat, bodyaches, fatigue and diarrhea.

## 2019-02-09 NOTE — ED Provider Notes (Signed)
[facility]    CSN: [id number] Arrival date & time: 02/09/19  1014      History   Chief Complaint Chief Complaint  Patient presents with  . covid test  . Fatigue  . Diarrhea  . Generalized Body Aches    HPI Sara Meza is a 44 y.o. female.   Pt report her husband has covid. Pt reports some bodyaches.  Pt has renal failure and is on dialysis.  Pt here for covid testing   The history is provided by the patient. No language interpreter was used.  Diarrhea Quality:  Watery Severity:  Mild Onset quality:  Gradual Progression:  Improving Relieved by:  Nothing Worsened by:  Nothing Associated symptoms: cough and myalgias   Associated symptoms: no abdominal pain     Past Medical History:  Diagnosis Date  . Anemia   . Chronic kidney disease 02/22/2018   ESRD-dialysis, training to do dialysis at home. Currently does dialysis every day.   . Dialysis patient ([place])   . GERD (gastroesophageal reflux disease)   . Hemolacria   . History of gestational diabetes 06/29/2012  . Hypertension     Patient Active Problem List   Diagnosis Date Noted  . Abdominal pain, acute, epigastric 07/24/2018  . Early satiety 07/24/2018  . Dyspepsia 07/24/2018  . Diarrhea due to drug 07/24/2018  . Vitamin D deficiency 03/08/2018  . ESRD (end stage renal disease) on dialysis ([place]) 03/08/2018  . Anemia of chronic disease 03/08/2018  . Secondary hyperparathyroidism of renal origin ([place]) 03/08/2018  . CKD (chronic kidney disease), stage V ([place]) 02/23/2018  . Acute renal failure ([place]) 02/22/2018  . Family history of colon cancer requiring screening colonoscopy 02/22/2018  . Rash and nonspecific skin eruption 02/22/2018  . History of varicose veins of lower extremity 02/22/2018  . Peripheral edema 02/22/2018  . Renal failure 02/22/2018  . Iron deficiency anemia due to chronic blood loss 02/22/2018  . Severe anemia 02/22/2018  . Menorrhagia with regular cycle 02/22/2018  .  Hypertension goal BP (blood pressure) < 130/80 02/21/2018  . Iron deficiency 02/21/2018  . Status post tubal ligation 02/21/2018  . Hypertensive retinopathy 02/21/2018  . Essential hypertension 12/20/2014  . Left ankle pain 08/25/2012  . History of gestational diabetes 06/29/2012  . 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 Bilateral plantar fasciitis 06/29/2012  . Restless legs 06/29/2012    Past Surgical History:  Procedure Laterality Date  . AV FISTULA PLACEMENT Right 03/02/2018   Procedure: ARTERIOVENOUS (AV) FISTULA CREATION;  Surgeon: Waynetta Sandy, MD;  Location: [facility];  Service: Vascular;  Laterality: Right;  . ENDOVENOUS ABLATION SAPHENOUS VEIN W/ LASER    . HYSTEROSCOPY WITH D & C N/A 09/13/2018   Procedure: DILATATION AND CURETTAGE WITH MINERVA ABLATION;  Surgeon: Emily Filbert, MD;  Location: [facility];  Service: Gynecology;  Laterality: N/A;  Rep will be here confirmed on 09/06/18 CS  . IR FLUORO GUIDE CV LINE RIGHT  02/24/2018  . IR US GUIDE VASC ACCESS RIGHT  02/24/2018  . LAPAROSCOPY    . TUBAL LIGATION      OB History    Gravida  3   Para  3   Term      Preterm      AB      Living        SAB      TAB      Ectopic      Multiple      Live Births  Home Medications    Prior to Admission medications   Medication Sig Start Date End Date Taking? Authorizing Provider  b complex vitamins capsule Take 1 capsule by mouth daily.     [provider]  B Complex-C-Zn-Folic Acid (DIALYVITE 884 WITH ZINC) 0.8 MG TABS Take by mouth. 09/07/18   [provider]  calcitRIOL (ROCALTROL) 0.5 MCG capsule TAKE 2 CAPSULES BY MOUTH ONCE A DAY 09/18/18   [provider]  cinacalcet (SENSIPAR) 30 MG tablet Take 30 mg by mouth daily.    [provider]  ferric citrate (AURYXIA) 1 GM 210 MG(Fe) tablet Take 3 tablets (630 mg total) by mouth 3 (three) times daily with meals. 03/03/18   Lavina Hamman, MD  pantoprazole  (PROTONIX) 40 MG tablet TAKE 1 TABLET BY MOUTH DAILY BEFORE BREAKFAST. 09/11/18   [name], PA-C  Vitamin D, Ergocalciferol, (DRISDOL) 1.25 MG (50000 UT) CAPS capsule Take 1 capsule (50,000 Units total) by mouth every 7 (seven) days. 03/10/18   Lavina Hamman, MD    Family History Family History  Problem Relation Age of Onset  . Diabetes Father   . Hypertension Father   . Diabetes Mother   . Hypertension Mother   . Colon cancer Sister   . Wilm's tumor Sister     Social History Social History   Tobacco Use  . Smoking status: Never Smoker  . Smokeless tobacco: Never Used  Substance Use Topics  . Alcohol use: Not Currently  . Drug use: Never     Allergies   Camellia and Lisinopril   Review of Systems Review of Systems  Gastrointestinal: Positive for diarrhea. Negative for abdominal pain.  Musculoskeletal: Positive for myalgias.  All other systems reviewed and are negative.    Physical Exam Triage Vital Signs ED Triage Vitals  Enc Vitals Group     BP 02/09/19 1033 106/73     Pulse Rate 02/09/19 1033 99     Resp 02/09/19 1033 18     Temp 02/09/19 1033 98.8 F (37.1 C)     Temp Source 02/09/19 1033 Oral     SpO2 02/09/19 1033 100 %     Weight 02/09/19 1034 138 lb (62.6 kg)     Height 02/09/19 1034 5\' 7"  (1.702 m)     Head Circumference --      Peak Flow --      Pain Score 02/09/19 1034 0     Pain Loc --      Pain Edu? --      Excl. in [place]? --    No data found.  Updated Vital Signs BP 106/73 (BP Location: Left Arm)   Pulse 99   Temp 98.8 F (37.1 C) (Oral)   Resp 18   Ht 5\' 7"  (1.702 m)   Wt 62.6 kg   SpO2 100%   BMI 21.61 kg/m   Visual Acuity Right Eye Distance:   Left Eye Distance:   Bilateral Distance:    Right Eye Near:   Left Eye Near:    Bilateral Near:     Physical Exam Vitals and nursing note reviewed.  Constitutional:      Appearance: She is well-developed.  HENT:     Head: Normocephalic.  Cardiovascular:      Rate and Rhythm: Normal rate.  Pulmonary:     Effort: Pulmonary effort is normal.  Abdominal:     General: There is no distension.  Musculoskeletal:        General:  Normal range of motion.     Cervical back: Normal range of motion.  Neurological:     Mental Status: She is alert and oriented to person, place, and time.  Psychiatric:        Mood and Affect: Mood normal.      UC Treatments / Results  Labs (all labs ordered are listed, but only abnormal results are displayed) Labs Reviewed  NOVEL CORONAVIRUS, NAA  POC SARS CORONAVIRUS 2 AG -  ED    EKG   Radiology No results found.  Procedures Procedures (including critical care time)  Medications Ordered in UC Medications - No data to display  Initial Impression / Assessment and Plan / UC Course  I have reviewed the triage vital signs and the nursing notes.  Pertinent labs & imaging results that were available during my care of the patient were reviewed by me and considered in my medical decision making (see chart for details).     Rapid covid is negative.  Send out pending.  Pt counseled as she is a high risk pt, she will need to monitor symptoms closely  Final Clinical Impressions(s) / UC Diagnoses   Final diagnoses:  Close exposure to COVID-19 virus  Viral illness     Discharge Instructions     Your covid test is pending   ED Prescriptions    None     PDMP not reviewed this encounter.  An After Visit Summary was printed and given to the patient.    Fransico [facility], [place] 02/09/19 1417

## 2019-02-09 NOTE — Discharge Instructions (Signed)
Your covid test is pending 

## 2019-02-11 LAB — NOVEL CORONAVIRUS, NAA: SARS-CoV-2, NAA: NOT DETECTED

## 2019-02-23 ENCOUNTER — Ambulatory Visit (INDEPENDENT_AMBULATORY_CARE_PROVIDER_SITE_OTHER): Payer: Medicare Other | Admitting: Medical-Surgical

## 2019-02-23 ENCOUNTER — Encounter: Payer: Self-pay | Admitting: Medical-Surgical

## 2019-02-23 VITALS — BP 84/55 | HR 92 | Temp 97.9°F

## 2019-02-23 DIAGNOSIS — U071 COVID-19: Secondary | ICD-10-CM | POA: Diagnosis not present

## 2019-02-23 MED ORDER — ONDANSETRON 8 MG PO TBDP: 8.0000 mg | ORAL_TABLET | Freq: Three times a day (TID) | ORAL | 3 refills | Status: DC | PRN

## 2019-02-23 NOTE — Progress Notes (Signed)
Virtual Visit via Video Note  I connected with Sara Meza on 02/23/19 at 10:10 AM EST by a video enabled telemedicine application and verified that I am speaking with the correct person using two identifiers.   I discussed the limitations of evaluation and management by telemedicine and the availability of in person appointments. The patient expressed understanding and agreed to proceed.  Subjective:    CC: Covid positive, nausea/vomiting  HPI: Pleasant 44 year old female presenting today via [facility] with reports of being diagnosed with Covid 2 weeks ago.  Symptomatic despite 2 - tests and was advised by the health department to go ahead and quarantine.  She is reporting weakness, lightheadedness, severe fatigue, nausea/vomiting, body aches, chills, mild nonproductive cough, loss of taste, and ear pain/pressure.  Has not been taking any medications.  Does nightly peritoneal dialysis with 1.5% dextrose solution, tolerating well. This morning blood pressure 84/55 with pulse of 92.  Has been unable to tolerate p.o. intake for several days but still tries to drink fluids regularly.  Reports sleeping at night without difficulty.  Also sleeping most of the day.  No fevers, shortness of breath, diarrhea.   Past medical history, Surgical history, Family history not pertinant except as noted below, Social history, Allergies, and medications have been entered into the medical record, reviewed, and corrections made.   Review of Systems: No fevers, chills, night sweats, weight loss, chest pain, or shortness of breath.   Objective:    General: Speaking clearly in complete sentences without any shortness of breath.  Alert and oriented x3.  Normal judgment. No apparent acute distress.    Impression and Recommendations:    COVID-19 infection Zofran 8 mg ODT every 8 hours as needed for nausea/vomiting.  Push fluids as tolerated.  Consider Gatorade or chicken/beef broth to help elevate blood pressure.   Declined cough medication.  May use Tylenol/ibuprofen as needed for body aches and discomfort.  Reviewed emergency care precautions.  If nausea does not improve or blood pressure not responsive to p.o. intake, may need IV fluids to treat hypotension.  Return if symptoms worsen or fail to improve.  I discussed the assessment and treatment plan with the patient. The patient was provided an opportunity to ask questions and all were answered. The patient agreed with the plan and demonstrated an understanding of the instructions.   The patient was advised to call back or seek an in-person evaluation if the symptoms worsen or if the condition fails to improve as anticipated.  35 minutes of non-face-to-face time was provided during this encounter.   Clearnce Sorrel, DNP, APRN, FNP-BC [facility]

## 2019-03-09 ENCOUNTER — Emergency Department (HOSPITAL_COMMUNITY): Payer: Medicare Other

## 2019-03-09 ENCOUNTER — Encounter (HOSPITAL_COMMUNITY): Payer: Self-pay

## 2019-03-09 ENCOUNTER — Emergency Department (HOSPITAL_COMMUNITY)
Admission: EM | Admit: 2019-03-09 | Discharge: 2019-03-10 | Disposition: A | Discharge status: Home / Self Care | Payer: Medicare Other | Attending: Emergency Medicine | Admitting: Emergency Medicine

## 2019-03-09 ENCOUNTER — Other Ambulatory Visit: Payer: Self-pay

## 2019-03-09 DIAGNOSIS — N186 End stage renal disease: Secondary | ICD-10-CM | POA: Diagnosis not present

## 2019-03-09 DIAGNOSIS — Z79899 Other long term (current) drug therapy: Secondary | ICD-10-CM | POA: Diagnosis not present

## 2019-03-09 DIAGNOSIS — D649 Anemia, unspecified: Secondary | ICD-10-CM

## 2019-03-09 DIAGNOSIS — Z992 Dependence on renal dialysis: Secondary | ICD-10-CM | POA: Insufficient documentation

## 2019-03-09 DIAGNOSIS — D631 Anemia in chronic kidney disease: Secondary | ICD-10-CM | POA: Insufficient documentation

## 2019-03-09 DIAGNOSIS — I120 Hypertensive chronic kidney disease with stage 5 chronic kidney disease or end stage renal disease: Secondary | ICD-10-CM | POA: Insufficient documentation

## 2019-03-09 DIAGNOSIS — R0602 Shortness of breath: Secondary | ICD-10-CM | POA: Diagnosis present

## 2019-03-09 LAB — CBC WITH DIFFERENTIAL/PLATELET
Abs Immature Granulocytes: 0.07 10*3/uL (ref 0.00–0.07)
Basophils Absolute: 0 10*3/uL (ref 0.0–0.1)
Basophils Relative: 0 %
Eosinophils Absolute: 0.2 10*3/uL (ref 0.0–0.5)
Eosinophils Relative: 3 %
HCT: 18 % — ABNORMAL LOW (ref 36.0–46.0)
Hemoglobin: 6.1 g/dL — CL (ref 12.0–15.0)
Immature Granulocytes: 1 %
Lymphocytes Relative: 18 %
Lymphs Abs: 1.6 10*3/uL (ref 0.7–4.0)
MCH: 32.3 pg (ref 26.0–34.0)
MCHC: 33.9 g/dL (ref 30.0–36.0)
MCV: 95.2 fL (ref 80.0–100.0)
Monocytes Absolute: 0.5 10*3/uL (ref 0.1–1.0)
Monocytes Relative: 6 %
Neutro Abs: 6.1 10*3/uL (ref 1.7–7.7)
Neutrophils Relative %: 72 %
Platelets: 281 10*3/uL (ref 150–400)
RBC: 1.89 MIL/uL — ABNORMAL LOW (ref 3.87–5.11)
RDW: 13.7 % (ref 11.5–15.5)
WBC: 8.5 10*3/uL (ref 4.0–10.5)
nRBC: 0 % (ref 0.0–0.2)

## 2019-03-09 LAB — COMPREHENSIVE METABOLIC PANEL
ALT: 18 U/L (ref 0–44)
AST: 12 U/L — ABNORMAL LOW (ref 15–41)
Albumin: 3.7 g/dL (ref 3.5–5.0)
Alkaline Phosphatase: 94 U/L (ref 38–126)
Anion gap: 15 (ref 5–15)
BUN: 59 mg/dL — ABNORMAL HIGH (ref 6–20)
CO2: 24 mmol/L (ref 22–32)
Calcium: 9.3 mg/dL (ref 8.9–10.3)
Chloride: 96 mmol/L — ABNORMAL LOW (ref 98–111)
Creatinine, Ser: 17.75 mg/dL — ABNORMAL HIGH (ref 0.44–1.00)
GFR calc Af Amer: 2 mL/min — ABNORMAL LOW (ref >60)
GFR calc non Af Amer: 2 mL/min — ABNORMAL LOW (ref >60)
Glucose, Bld: 141 mg/dL — ABNORMAL HIGH (ref 70–99)
Potassium: 3.3 mmol/L — ABNORMAL LOW (ref 3.5–5.1)
Sodium: 135 mmol/L (ref 135–145)
Total Bilirubin: 0.6 mg/dL (ref 0.3–1.2)
Total Protein: 7.1 g/dL (ref 6.5–8.1)

## 2019-03-09 LAB — TROPONIN I (HIGH SENSITIVITY)
Troponin I (High Sensitivity): 17 ng/L (ref <18)
Troponin I (High Sensitivity): 15 ng/L (ref <18)

## 2019-03-09 LAB — PREPARE RBC (CROSSMATCH)

## 2019-03-09 LAB — POC OCCULT BLOOD, ED: Fecal Occult Bld: NEGATIVE

## 2019-03-09 MED ORDER — SODIUM CHLORIDE 0.9% IV SOLUTION: Freq: Once | INTRAVENOUS | Status: AC

## 2019-03-09 MED ORDER — FUROSEMIDE 10 MG/ML IJ SOLN
80.0000 mg | Freq: Once | INTRAMUSCULAR | Status: AC
  Administered 2019-03-10: 80 mg via INTRAVENOUS
  Filled 2019-03-09: qty 8

## 2019-03-09 NOTE — ED Provider Notes (Signed)
[facility] EMERGENCY DEPARTMENT Provider Note   CSN: [id number] Arrival date & time: 03/09/19  1254     History Chief Complaint  Patient presents with  . Shortness of Breath  . Weakness    Sara Meza is a 44 y.o. female.  44yo female with end-stage renal disease secondary to hypertension presents with complaint of weakness and fatigue for 3 weeks, had COVID 3 weeks ago (tested negative but was symptomatic and family members in same house tested positive). Peritoneal dialysis at home daily. Sees Dr. Tory Emerald in [place] with [facility], does make small amount of urine.  Patient had a nurse visit, was called and told that her hemoglobin was low and to come to the emergency room.  Patient has history of symptomatic anemia, last had a transfusion for same in April of last year.  Patient was scheduled for colonoscopy however was unable to get her colonoscopy done due to Covid pandemic.  Patient denies vaginal bleeding or dark or bloody stools.  States that they have not been able to figure out why she becomes anemic.  No other complaints or concerns today.        Past Medical History:  Diagnosis Date  . Anemia   . Chronic kidney disease 02/22/2018   ESRD-dialysis, training to do dialysis at home. Currently does dialysis every day.   . Dialysis patient ([place])   . GERD (gastroesophageal reflux disease)   . Hemolacria   . History of gestational diabetes 06/29/2012  . Hypertension     Patient Active Problem List   Diagnosis Date Noted  . Abdominal pain, acute, epigastric 07/24/2018  . Early satiety 07/24/2018  . Dyspepsia 07/24/2018  . Diarrhea due to drug 07/24/2018  . Vitamin D deficiency 03/08/2018  . ESRD (end stage renal disease) on dialysis ([place]) 03/08/2018  . Anemia of chronic disease 03/08/2018  . Secondary hyperparathyroidism of renal origin ([place]) 03/08/2018  . CKD (chronic kidney disease), stage V ([place]) 02/23/2018  . Acute renal failure ([place]) 02/22/2018   . Family history of colon cancer requiring screening colonoscopy 02/22/2018  . Rash and nonspecific skin eruption 02/22/2018  . History of varicose veins of lower extremity 02/22/2018  . Peripheral edema 02/22/2018  . Renal failure 02/22/2018  . Iron deficiency anemia due to chronic blood loss 02/22/2018  . Severe anemia 02/22/2018  . Menorrhagia with regular cycle 02/22/2018  . Hypertension goal BP (blood pressure) < 130/80 02/21/2018  . Iron deficiency 02/21/2018  . Status post tubal ligation 02/21/2018  . Hypertensive retinopathy 02/21/2018  . Essential hypertension 12/20/2014  . Left ankle pain 08/25/2012  . History of gestational diabetes 06/29/2012  . 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 Bilateral plantar fasciitis 06/29/2012  . Restless legs 06/29/2012    Past Surgical History:  Procedure Laterality Date  . AV FISTULA PLACEMENT Right 03/02/2018   Procedure: ARTERIOVENOUS (AV) FISTULA CREATION;  Surgeon: Waynetta Sandy, MD;  Location: [facility];  Service: Vascular;  Laterality: Right;  . ENDOVENOUS ABLATION SAPHENOUS VEIN W/ LASER    . HYSTEROSCOPY WITH D & C N/A 09/13/2018   Procedure: DILATATION AND CURETTAGE WITH MINERVA ABLATION;  Surgeon: Emily Filbert, MD;  Location: [facility];  Service: Gynecology;  Laterality: N/A;  Rep will be here confirmed on 09/06/18 CS  . IR FLUORO GUIDE CV LINE RIGHT  02/24/2018  . IR US GUIDE VASC ACCESS RIGHT  02/24/2018  . LAPAROSCOPY    . TUBAL LIGATION       OB History  Gravida  3   Para  3   Term      Preterm      AB      Living        SAB      TAB      Ectopic      Multiple      Live Births              Family History  Problem Relation Age of Onset  . Diabetes Father   . Hypertension Father   . Diabetes Mother   . Hypertension Mother   . Colon cancer Sister   . Wilm's tumor Sister     Social History   Tobacco Use  . Smoking status: Never Smoker  . Smokeless tobacco: Never Used  Substance Use  Topics  . Alcohol use: Not Currently  . Drug use: Never    Home Medications Prior to Admission medications   Medication Sig Start Date End Date Taking? Authorizing Provider  b complex vitamins capsule Take 1 capsule by mouth daily.    Yes [provider]  B Complex-C-Zn-Folic Acid (DIALYVITE 814 WITH ZINC) 0.8 MG TABS Take 1 tablet by mouth daily.  09/07/18  Yes [provider]  calcitRIOL (ROCALTROL) 0.5 MCG capsule Take 0.5 mcg by mouth daily.  09/18/18  Yes [provider]  cinacalcet (SENSIPAR) 60 MG tablet Take 60 mg by mouth daily.    Yes [provider]  docusate sodium (COLACE) 100 MG capsule Take 200 mg by mouth daily as needed for mild constipation.   Yes [provider]  ferric citrate (AURYXIA) 1 GM 210 MG(Fe) tablet Take 3 tablets (630 mg total) by mouth 3 (three) times daily with meals. Patient taking differently: Take 630 mg by mouth 3 (three) times daily with meals. 2 tablets with a snack 03/03/18  Yes Lavina Hamman, MD  Melatonin-Pyridoxine (MELATIN PO) Take by mouth at bedtime as needed for sleep. 01/18/19  Yes [provider]  ondansetron (ZOFRAN-ODT) 8 MG disintegrating tablet Take 1 tablet (8 mg total) by mouth every 8 (eight) hours as needed for nausea. 02/23/19  Yes Jessup, Joy, NP  pantoprazole (PROTONIX) 40 MG tablet TAKE 1 TABLET BY MOUTH DAILY BEFORE BREAKFAST. Patient taking differently: Take 40 mg by mouth daily.  09/11/18  Yes [name], PA-C    Allergies    Camellia and Lisinopril  Review of Systems   Review of Systems  Constitutional: Positive for fatigue. Negative for fever.  Respiratory: Negative for shortness of breath.   Cardiovascular: Negative for chest pain.  Gastrointestinal: Negative for abdominal pain, blood in stool, constipation, diarrhea, nausea and vomiting.  Genitourinary: Negative for difficulty urinating and dysuria.  Skin: Negative for rash and wound.  Neurological:  Positive for weakness.  Psychiatric/Behavioral: Negative for confusion.  All other systems reviewed and are negative.   Physical Exam Updated Vital Signs BP 111/68   Pulse 94   Temp 98.7 F (37.1 C) (Oral)   Resp (!) 30   SpO2 100%   Physical Exam Vitals and nursing note reviewed.  Constitutional:      General: She is not in acute distress.    Appearance: She is well-developed. She is not diaphoretic.  HENT:     Head: Normocephalic and atraumatic.  Cardiovascular:     Rate and Rhythm: Normal rate and regular rhythm.  Pulmonary:     Effort: Pulmonary effort is normal.     Breath sounds: Normal breath  sounds. No decreased breath sounds.  Abdominal:     Palpations: Abdomen is soft.     Tenderness: There is no abdominal tenderness.     Comments: Right lower abdominal peritoneal dialysis access  Musculoskeletal:     Right lower leg: No edema.     Left lower leg: No edema.  Skin:    General: Skin is warm and dry.     Coloration: Skin is not pale.  Neurological:     Mental Status: She is alert and oriented to person, place, and time.  Psychiatric:        Behavior: Behavior normal.     ED Results / Procedures / Treatments   Labs (all labs ordered are listed, but only abnormal results are displayed) Labs Reviewed  CBC WITH DIFFERENTIAL/PLATELET - Abnormal; Notable for the following components:      Result Value   RBC 1.89 (*)    Hemoglobin 6.1 (*)    HCT 18.0 (*)    All other components within normal limits  COMPREHENSIVE METABOLIC PANEL - Abnormal; Notable for the following components:   Potassium 3.3 (*)    Chloride 96 (*)    Glucose, Bld 141 (*)    BUN 59 (*)    Creatinine, Ser 17.75 (*)    AST 12 (*)    GFR calc non Af Amer 2 (*)    GFR calc Af Amer 2 (*)    All other components within normal limits  POC OCCULT BLOOD, ED  PREPARE RBC (CROSSMATCH)  TYPE AND SCREEN  TROPONIN I (HIGH SENSITIVITY)  TROPONIN I (HIGH SENSITIVITY)     EKG None  Radiology DG Chest Portable 1 View  Result Date: 03/09/2019 CLINICAL DATA:  Shortness of breath EXAM: PORTABLE CHEST 1 VIEW COMPARISON:  None. FINDINGS: Upper limits of normal heart size. The mediastinal contours are within normal limits. No focal airspace consolidation, pleural effusion, or pneumothorax. The visualized skeletal structures are unremarkable. IMPRESSION: No active disease. Electronically Signed   By: Davina Poke D.O.   On: 03/09/2019 15:38    Procedures .Critical Care Performed by: Tacy Learn, PA-C Authorized by: Tacy Learn, PA-C   Critical care provider statement:    Critical care time (minutes):  45   Critical care was time spent personally by me on the following activities:  Discussions with consultants, evaluation of patient's response to treatment, examination of patient, ordering and performing treatments and interventions, ordering and review of laboratory studies, ordering and review of radiographic studies, pulse oximetry, re-evaluation of patient's condition, obtaining history from patient or surrogate and review of old charts   (including critical care time)  Medications Ordered in ED Medications  0.9 %  sodium chloride infusion (Manually program via Guardrails IV Fluids) (has no administration in time range)  furosemide (LASIX) injection 80 mg (has no administration in time range)    ED Course  I have reviewed the triage vital signs and the nursing notes.  Pertinent labs & imaging results that were available during my care of the patient were reviewed by me and considered in my medical decision making (see chart for details).  Clinical Course as of Mar 08 2253  Fri Mar 09, 1915  4674 44 year old female with history of end-stage renal disease, on peritoneal dialysis daily at home with complaint of fatigue and generalized weakness, labs outside of the ER showing anemia and sent to the emergency room for blood transfusion.  On  exam, patient is well-appearing, lung sounds are clear,  no lower extremity edema. CMP without significant change from prior labs, mild hypokalemia with potassium of 3.3.  Hemoglobin of 10.5 in August 2020, hemoglobin 6.1 today.   [LM]  1555 Nephrology paged for recommendation on transfusion in the ER versus admission.  In the past, patient has received 1 to 2 units in the emergency room with dose of Lasix and discharged home.   [LM]  1824 Case discussed with Dr. Marval Regal with nephrology, patient is a [facility] patient, agrees with plan to transfuse up to 2 units, can dose 80mg  Lasix after her transfusion with plan for discharge home.    [LM]  2007 Patient receiving unit #1 currently. Doing well, given apple juice and meal. Discussed plan of care.    [LM]  2254 Patient receiving second unit PRBCs, plan to administer lasix 80mg  after completed. Will recheck H&H after second unit with plan for discharge.    [LM]    Clinical Course User Index [LM] Roque Lias   MDM Rules/Calculators/A&P                     Final Clinical Impression(s) / ED Diagnoses Final diagnoses:  Symptomatic anemia    Rx / DC Orders ED Discharge Orders    None       Tacy Learn, PA-C 03/09/19 2255    Lennice Sites, DO 03/09/19 2257

## 2019-03-09 NOTE — Discharge Instructions (Addendum)
Follow up with your PCP and nephrologist for further work up regarding your anemia. Your stool test today was negative for blood.   Your hemoglobin came up to 7.5 from 6.1 after the transfusion.

## 2019-03-09 NOTE — ED Triage Notes (Signed)
Pt reports she has ongoing SOB x3 weeks associated with weakness. Pt's daughter and husband both tested positive for covid 3 weeks ago, since then she has had 2 negative covid test, pt's PCP did a complete work up on her, reports only thing wrong was a slightly low Hgb. Pt states, "I know I have covid" pt denies any new symptoms other than ongoing SOB and weakness

## 2019-03-10 DIAGNOSIS — N186 End stage renal disease: Secondary | ICD-10-CM | POA: Diagnosis not present

## 2019-03-10 LAB — HEMOGLOBIN AND HEMATOCRIT, BLOOD
HCT: 21.9 % — ABNORMAL LOW (ref 36.0–46.0)
Hemoglobin: 7.5 g/dL — ABNORMAL LOW (ref 12.0–15.0)

## 2019-03-10 LAB — TYPE AND SCREEN
ABO/RH(D): O NEG
Antibody Screen: NEGATIVE
Unit division: 0
Unit division: 0

## 2019-03-10 LAB — BPAM RBC
Blood Product Expiration Date: 202102212359
Blood Product Expiration Date: 202102232359
ISSUE DATE / TIME: 202102191807
ISSUE DATE / TIME: 202102192153
Unit Type and Rh: 9500
Unit Type and Rh: 9500

## 2019-03-10 NOTE — ED Notes (Signed)
Patient verbalizes understanding of discharge instructions. Opportunity for questioning and answers were provided. Armband removed by staff, pt discharged from ED. Pt. ambulatory and discharged home.  

## 2019-03-10 NOTE — ED Provider Notes (Signed)
Feels much better after transfusion.  States she is ready to go home.  HGB improved to 7.5.    Montine Circle, PA-C 03/10/19 Sylvester Harder    Ripley Fraise, MD 03/10/19 [phone]

## 2019-03-16 ENCOUNTER — Ambulatory Visit (INDEPENDENT_AMBULATORY_CARE_PROVIDER_SITE_OTHER): Payer: Medicare Other | Admitting: Medical-Surgical

## 2019-03-16 ENCOUNTER — Other Ambulatory Visit: Payer: Self-pay

## 2019-03-16 ENCOUNTER — Encounter: Payer: Self-pay | Admitting: Medical-Surgical

## 2019-03-16 VITALS — BP 100/65 | HR 91 | Temp 98.1°F | Ht 67.0 in | Wt 139.1 lb

## 2019-03-16 DIAGNOSIS — Z1211 Encounter for screening for malignant neoplasm of colon: Secondary | ICD-10-CM

## 2019-03-16 DIAGNOSIS — K649 Unspecified hemorrhoids: Secondary | ICD-10-CM

## 2019-03-16 MED ORDER — HYDROCORTISONE ACETATE 25 MG RE SUPP: 25.0000 mg | Freq: Two times a day (BID) | RECTAL | 2 refills | Status: DC | PRN

## 2019-03-16 NOTE — Progress Notes (Signed)
Subjective:    CC: Rectal itching with protrusion  HPI: Pleasant 44 year old female presenting today for complaint of rectal protrusion with some rectal itching.  She was evaluated  in the ED last Friday and found to have hemoglobin of 5.1.  At that time they did a rectal exam but did not report any abnormal findings.  She received 2 units packed red blood cell transfusion and was released back home.  She has had daily soft bowel movements.  Noticed a small protrusion from the rectum after a bowel movement several days ago and was able to push it back in with her finger.  No bright red blood or burning.  No prior history of hemorrhoids.  Overdue for colonoscopy which was scheduled last year but canceled due to Covid.  Has a sister who was diagnosed with colon cancer at 28.  I reviewed the past medical history, family history, social history, surgical history, and allergies today and no changes were needed.  Please see the problem list section below in epic for further details.  Past Medical History: Past Medical History:  Diagnosis Date  . Anemia   . Chronic kidney disease 02/22/2018   ESRD-dialysis, training to do dialysis at home. Currently does dialysis every day.   . Dialysis patient ([place])   . GERD (gastroesophageal reflux disease)   . Hemolacria   . History of gestational diabetes 06/29/2012  . Hypertension    Past Surgical History: Past Surgical History:  Procedure Laterality Date  . AV FISTULA PLACEMENT Right 03/02/2018   Procedure: ARTERIOVENOUS (AV) FISTULA CREATION;  Surgeon: Waynetta Sandy, MD;  Location: [place];  Service: Vascular;  Laterality: Right;  . ENDOVENOUS ABLATION SAPHENOUS VEIN W/ LASER    . HYSTEROSCOPY WITH D & C N/A 09/13/2018   Procedure: DILATATION AND CURETTAGE WITH MINERVA ABLATION;  Surgeon: Emily Filbert, MD;  Location: [facility];  Service: Gynecology;  Laterality: N/A;  Rep will be here confirmed on 09/06/18 CS  . IR FLUORO GUIDE CV LINE RIGHT   02/24/2018  . IR US GUIDE VASC ACCESS RIGHT  02/24/2018  . LAPAROSCOPY    . TUBAL LIGATION     Social History: Social History   Socioeconomic History  . Marital status: Married    Spouse name: Not on file  . Number of children: Not on file  . Years of education: Not on file  . Highest education level: Not on file  Occupational History  . Not on file  Tobacco Use  . Smoking status: Never Smoker  . Smokeless tobacco: Never Used  Substance and Sexual Activity  . Alcohol use: Not Currently  . Drug use: Never  . Sexual activity: Yes    Birth control/protection: Surgical, None  Other Topics Concern  . Not on file  Social History Narrative  . Not on file   Social Determinants of Health   Financial Resource Strain:   . Difficulty of Paying Living Expenses: Not on file  Food Insecurity:   . Worried About Charity fundraiser in the Last Year: Not on file  . Ran Out of Food in the Last Year: Not on file  Transportation Needs:   . Lack of Transportation (Medical): Not on file  . Lack of Transportation (Non-Medical): Not on file  Physical Activity:   . Days of Exercise per Week: Not on file  . Minutes of Exercise per Session: Not on file  Stress:   . Feeling of Stress : Not on file  Social  Connections:   . Frequency of Communication with Friends and Family: Not on file  . Frequency of Social Gatherings with Friends and Family: Not on file  . Attends Religious Services: Not on file  . Active Member of Clubs or Organizations: Not on file  . Attends Archivist Meetings: Not on file  . Marital Status: Not on file   Family History: Family History  Problem Relation Age of Onset  . Diabetes Father   . Hypertension Father   . Diabetes Mother   . Hypertension Mother   . Colon cancer Sister   . Wilm's tumor Sister    Allergies: Allergies  Allergen Reactions  . Camellia Other (See Comments)    Irregular menses  . Lisinopril Other (See Comments)    irregular menses    Medications: See med rec.  Review of Systems: No fevers, chills, night sweats, weight loss, chest pain, or shortness of breath.   Objective:    General: Well Developed, well nourished, and in no acute distress.  Neuro: Alert and oriented x3.  HEENT: Normocephalic, atraumatic.  Skin: Warm and dry. Cardiac: Regular rate and rhythm, no murmurs rubs or gallops, no lower extremity edema.  Respiratory: Clear to auscultation bilaterally. Not using accessory muscles, speaking in full sentences. Rectal exam deferred per patient comfort.   Impression and Recommendations:    Hemorrhoids Anusol 25 mg suppositories twice daily as needed for hemorrhoids.  If no improvement in the next 72 hours, will need to have exam performed.  Need for colon cancer screening Referral to GI for colonoscopy.  Return if symptoms worsen or fail to improve.. ___________________________________________ Clearnce Sorrel, DNP, APRN, FNP-BC [facility] [place]

## 2019-03-21 ENCOUNTER — Encounter: Payer: Self-pay | Admitting: Gastroenterology

## 2019-04-05 ENCOUNTER — Ambulatory Visit (AMBULATORY_SURGERY_CENTER): Payer: Self-pay | Admitting: *Deleted

## 2019-04-05 ENCOUNTER — Other Ambulatory Visit: Payer: Self-pay

## 2019-04-05 VITALS — Temp 96.8°F | Ht 67.0 in | Wt 146.0 lb

## 2019-04-05 DIAGNOSIS — Z800 Family history of malignant neoplasm of digestive organs: Secondary | ICD-10-CM

## 2019-04-05 NOTE — Progress Notes (Signed)
Patient came in today for a PV for "screening" colonoscopy. She starts describing changes in bowel habits, constipation one day then 12 BM's the next, also having nausea and vomiting and stomach feeling full.  She also states her HGB is 7.5. ESRD on dialysis. [facility]- sister at age 46 per pt. Explained to the patient that she needs an office visit if she is having multiple GI symptoms and with a low HGB. Made OV with Lyndel Safe with his 1st available, colonoscopy cancelled at this time. Pt is aware. Appointment card and address given to pt.

## 2019-04-10 ENCOUNTER — Other Ambulatory Visit: Payer: Self-pay

## 2019-04-10 ENCOUNTER — Encounter: Payer: Self-pay | Admitting: Medical-Surgical

## 2019-04-10 ENCOUNTER — Ambulatory Visit (INDEPENDENT_AMBULATORY_CARE_PROVIDER_SITE_OTHER): Payer: Medicare Other | Admitting: Medical-Surgical

## 2019-04-10 ENCOUNTER — Telehealth: Payer: Self-pay

## 2019-04-10 ENCOUNTER — Ambulatory Visit (INDEPENDENT_AMBULATORY_CARE_PROVIDER_SITE_OTHER): Payer: Medicare Other

## 2019-04-10 VITALS — BP 105/70 | HR 82 | Temp 98.2°F | Ht 67.0 in | Wt 144.1 lb

## 2019-04-10 DIAGNOSIS — M79604 Pain in right leg: Secondary | ICD-10-CM

## 2019-04-10 DIAGNOSIS — R062 Wheezing: Secondary | ICD-10-CM

## 2019-04-10 DIAGNOSIS — M79605 Pain in left leg: Secondary | ICD-10-CM

## 2019-04-10 DIAGNOSIS — E559 Vitamin D deficiency, unspecified: Secondary | ICD-10-CM

## 2019-04-10 DIAGNOSIS — N185 Chronic kidney disease, stage 5: Secondary | ICD-10-CM

## 2019-04-10 DIAGNOSIS — R0601 Orthopnea: Secondary | ICD-10-CM

## 2019-04-10 DIAGNOSIS — D638 Anemia in other chronic diseases classified elsewhere: Secondary | ICD-10-CM | POA: Diagnosis not present

## 2019-04-10 DIAGNOSIS — N186 End stage renal disease: Secondary | ICD-10-CM

## 2019-04-10 DIAGNOSIS — Z992 Dependence on renal dialysis: Secondary | ICD-10-CM

## 2019-04-10 MED ORDER — ALBUTEROL SULFATE HFA 108 (90 BASE) MCG/ACT IN AERS: 2.0000 | INHALATION_SPRAY | Freq: Four times a day (QID) | RESPIRATORY_TRACT | 2 refills | Status: DC | PRN

## 2019-04-10 NOTE — Telephone Encounter (Signed)
Patient aware.

## 2019-04-10 NOTE — Telephone Encounter (Signed)
Pt states she would like to be referred to a nephrologist in [place].

## 2019-04-10 NOTE — Progress Notes (Signed)
Subjective:    CC: discuss hemoglobin  HPI: Sara Meza presenting to discuss hemoglobin levels. Peritoneal dialysis patient with nephrology managing labs monthly. Recent transfusion for hgb 5.1 with recheck at 7.5. Referred for screening colonoscopy at last appointment but was told she would need a GI consult prior to the colonoscopy due to her low hemoglobin and recent bowel changes.  Was directed by the nurse at her dialysis center to discuss a referral to hematology with her PCP.  Reports some fatigue, difficulty sleeping.  Notes that she did have a small amount of light red blood in her stool on Saturday and Sunday when she was experiencing a lot of diarrhea but this is since resolved.  Reporting a dry cough with moderate orthopnea.  Using one pillow to sleep at night but feels as if she cannot catch her breath.  Does note hearing some wheezing.  Denies fever, chills, recent illness.  Doing well with peritoneal dialysis.  UF this morning in the 1300s after using 4.25% dextrose fluids with her treatment last night.  Denies swelling.  Good appetite with adequate intake, does not believe she has lost body weight.  Reports pain in bottom of toes and calves bilaterally especially at night.  Described as aching without numbness or tingling.  No aggravating or alleviating factors noted.  I reviewed the past medical history, family history, social history, surgical history, and allergies today and no changes were needed.  Please see the problem list section below in epic for further details.  Past Medical History: Past Medical History:  Diagnosis Date  . Anemia   . Chronic kidney disease 02/22/2018   ESRD-dialysis, training to do dialysis at home. Currently does dialysis every day.   . Dialysis patient ([place])   . GERD (gastroesophageal reflux disease)   . Hemolacria   . History of gestational diabetes 06/29/2012  . Hypertension    Past Surgical History: Past Surgical History:   Procedure Laterality Date  . AV FISTULA PLACEMENT Right 03/02/2018   Procedure: ARTERIOVENOUS (AV) FISTULA CREATION;  Surgeon: Waynetta Sandy, MD;  Location: [facility];  Service: Vascular;  Laterality: Right;  . ENDOVENOUS ABLATION SAPHENOUS VEIN W/ LASER    . HYSTEROSCOPY WITH D & C N/A 09/13/2018   Procedure: DILATATION AND CURETTAGE WITH MINERVA ABLATION;  Surgeon: Emily Filbert, MD;  Location: [place];  Service: Gynecology;  Laterality: N/A;  Rep will be here confirmed on 09/06/18 CS  . IR FLUORO GUIDE CV LINE RIGHT  02/24/2018  . IR US GUIDE VASC ACCESS RIGHT  02/24/2018  . LAPAROSCOPY    . TUBAL LIGATION     Social History: Social History   Socioeconomic History  . Marital status: Married    Spouse name: Not on file  . Number of children: Not on file  . Years of education: Not on file  . Highest education level: Not on file  Occupational History  . Not on file  Tobacco Use  . Smoking status: Never Smoker  . Smokeless tobacco: Never Used  Substance and Sexual Activity  . Alcohol use: Not Currently  . Drug use: Never  . Sexual activity: Yes    Birth control/protection: Surgical, None  Other Topics Concern  . Not on file  Social History Narrative  . Not on file   Social Determinants of Health   Financial Resource Strain:   . Difficulty of Paying Living Expenses:   Food Insecurity:   . Worried About Charity fundraiser in the Last  Year:   . Ran Out of Food in the Last Year:   Transportation Needs:   . Film/video editor (Medical):   Marland Kitchen Lack of Transportation (Non-Medical):   Physical Activity:   . Days of Exercise per Week:   . Minutes of Exercise per Session:   Stress:   . Feeling of Stress :   Social Connections:   . Frequency of Communication with Friends and Family:   . Frequency of Social Gatherings with Friends and Family:   . Attends Religious Services:   . Active Member of Clubs or Organizations:   . Attends Archivist Meetings:   Marland Kitchen  Marital Status:    Family History: Family History  Problem Relation Age of Onset  . Diabetes Father   . Hypertension Father   . Diabetes Mother   . Hypertension Mother   . Colon cancer Sister 59  . Wilm's tumor Sister   . Colon polyps Neg Hx   . Esophageal cancer Neg Hx   . Rectal cancer Neg Hx   . Stomach cancer Neg Hx    Allergies: Allergies  Allergen Reactions  . Camellia Other (See Comments)    Irregular menses  . Lisinopril Other (See Comments)    irregular menses   Medications: See med rec.  Review of Systems: No fevers, chills, night sweats, weight loss, chest pain, or shortness of breath.   Objective:    General: Well Developed, well nourished, and in no acute distress.  Neuro: Alert and oriented x3.  HEENT: Normocephalic, atraumatic.  Skin: Warm and dry. Cardiac: Regular rate and rhythm, no murmurs rubs or gallops, no lower extremity edema.  Respiratory: Scattered wheezes over left upper lobe and right upper/middle lobe. Not using accessory muscles, speaking in full sentences.   Impression and Recommendations:    1. Anemia of chronic disease Checking CBC and iron panel today.  Referral to hematology in [facility]. - CBC - Fe+TIBC+Fer - Ambulatory referral to Hematology  2. Bilateral leg pain Checking vitamin D today. - VITAMIN D 25 Hydroxy (Vit-D Deficiency, Fractures)  3. Orthopnea Chest x-ray today.  Try albuterol inhaler 2 puffs every 6 hours as needed for wheezing. - DG Chest 2 View; Future - albuterol (VENTOLIN HFA) 108 (90 Base) MCG/ACT inhaler; Inhale 2 puffs into the lungs every 6 (six) hours as needed for wheezing.  Dispense: 6.7 g; Refill: 2  4. CKD (chronic kidney disease), stage V (HCC) Checking vitamin D, CBC, and iron panel today. - VITAMIN D 25 Hydroxy (Vit-D Deficiency, Fractures)  5. Wheezing Chest x-ray today. Albuterol inhaler 2 puffs every 6 hours as needed. - albuterol (VENTOLIN HFA) 108 (90 Base) MCG/ACT inhaler; Inhale 2  puffs into the lungs every 6 (six) hours as needed for wheezing.  Dispense: 6.7 g; Refill: 2  Return in about 1 month (around 05/11/2019) for Anemia, bilateral leg pain.  ___________________________________________ Clearnce Sorrel, DNP, APRN, FNP-BC Primary Care and [place]

## 2019-04-10 NOTE — Telephone Encounter (Signed)
-----   Message from Samuel Bouche, NP sent at 04/10/2019  4:17 PM EDT ----- Regarding: Nephrology referral Will you please contact patient to see if she would like a referral to a new nephrologist and if so, would she prefer [facility] or [facility]?  Thanks, Samuel Bouche, FNP

## 2019-04-11 LAB — IRON,TIBC AND FERRITIN PANEL
%SAT: 32 % (calc) (ref 16–45)
Ferritin: 1425 ng/mL — ABNORMAL HIGH (ref 16–232)
Iron: 52 ug/dL (ref 40–190)
TIBC: 163 mcg/dL (calc) — ABNORMAL LOW (ref 250–450)

## 2019-04-11 LAB — CBC
HCT: 21.1 % — ABNORMAL LOW (ref 35.0–45.0)
Hemoglobin: 7 g/dL — ABNORMAL LOW (ref 11.7–15.5)
MCH: 32.9 pg (ref 27.0–33.0)
MCHC: 33.2 g/dL (ref 32.0–36.0)
MCV: 99.1 fL (ref 80.0–100.0)
MPV: 9.2 fL (ref 7.5–12.5)
Platelets: 263 10*3/uL (ref 140–400)
RBC: 2.13 10*6/uL — ABNORMAL LOW (ref 3.80–5.10)
RDW: 17.8 % — ABNORMAL HIGH (ref 11.0–15.0)
WBC: 8.5 10*3/uL (ref 3.8–10.8)

## 2019-04-11 LAB — VITAMIN D 25 HYDROXY (VIT D DEFICIENCY, FRACTURES): Vit D, 25-Hydroxy: 10 ng/mL — ABNORMAL LOW (ref 30–100)

## 2019-04-11 MED ORDER — CHOLECALCIFEROL 1.25 MG (50000 UT) PO TABS: 1.0000 | ORAL_TABLET | ORAL | 0 refills | Status: DC

## 2019-04-11 NOTE — Addendum Note (Signed)
Addended bySamuel Bouche on: 04/11/2019 07:59 AM   Modules accepted: Orders

## 2019-04-17 ENCOUNTER — Encounter: Payer: Self-pay | Admitting: Gastroenterology

## 2019-04-17 ENCOUNTER — Ambulatory Visit (INDEPENDENT_AMBULATORY_CARE_PROVIDER_SITE_OTHER): Payer: Medicare Other | Admitting: Gastroenterology

## 2019-04-17 ENCOUNTER — Other Ambulatory Visit: Payer: Self-pay

## 2019-04-17 VITALS — BP 90/60 | HR 88 | Temp 97.6°F | Ht 67.0 in | Wt 143.5 lb

## 2019-04-17 DIAGNOSIS — D649 Anemia, unspecified: Secondary | ICD-10-CM | POA: Diagnosis not present

## 2019-04-17 DIAGNOSIS — K219 Gastro-esophageal reflux disease without esophagitis: Secondary | ICD-10-CM

## 2019-04-17 DIAGNOSIS — Z01818 Encounter for other preprocedural examination: Secondary | ICD-10-CM

## 2019-04-17 DIAGNOSIS — R112 Nausea with vomiting, unspecified: Secondary | ICD-10-CM | POA: Diagnosis not present

## 2019-04-17 DIAGNOSIS — Z800 Family history of malignant neoplasm of digestive organs: Secondary | ICD-10-CM | POA: Diagnosis not present

## 2019-04-17 MED ORDER — ONDANSETRON 4 MG PO TBDP: 4.0000 mg | ORAL_TABLET | Freq: Three times a day (TID) | ORAL | 0 refills | Status: DC | PRN

## 2019-04-17 NOTE — Patient Instructions (Signed)
If you are age 46 or older, your body mass index should be between 23-30. Your Body mass index is 22.48 kg/m. If this is out of the aforementioned range listed, please consider follow up with your Primary Care Provider.  If you are age 55 or younger, your body mass index should be between 19-25. Your Body mass index is 22.48 kg/m. If this is out of the aformentioned range listed, please consider follow up with your Primary Care Provider.   You have been scheduled for an endoscopy and colonoscopy. Please follow the written instructions given to you at your visit today. Please pick up your prep supplies at the pharmacy within the next 1-3 days. If you use inhalers (even only as needed), please bring them with you on the day of your procedure. Your physician has requested that you go to www.startemmi.com and enter the access code given to you at your visit today. This web site gives a general overview about your procedure. However, you should still follow specific instructions given to you by our office regarding your preparation for the procedure.  We have sent the following medications to your pharmacy for you to pick up at your convenience: Zofran   Thank you,  Dr. Jackquline Denmark

## 2019-04-17 NOTE — Progress Notes (Signed)
Chief Complaint: For GI evaluation  Referring Provider:  Samuel Bouche, NP      ASSESSMENT AND PLAN;   #1. GERD #2. Intermittent N/V #3. Anemia of chronic disease d/t ESRD on PD (d/t ?  Etiology, ?HTN). On list for renal transplant @ [facility]. #4. Alt diarrhea/constipation #5. FH colon cancer (sis at age 44) #16. Weight Loss.  Plan: -Proceed with EGD/colon with miralax. Discussed risks & benefits. (Risks including rare perforation req laparotomy, bleeding after bx/polypectomy req blood transfusion, rarely missing neoplasms, risks of anesthesia/sedation). Benefits outweigh the risks. Patient agrees to proceed. All the questions were answered. Consent forms given for review. -Agree with hematology consult.  She has appointment in coming days. Continue Mircera (EPO) -Zofran 4mg  ODT Q8hrs prn, #30. -If still with problems and the above WU is neg, proceed with CT scan abdo/pelvis.     HPI:    Sara Meza is a 44 y.o. female  N/V x 3 months -intermittent, could not identify any definite triggering foods, well controlled on as needed Zofran. Longstanding history of reflux for several years.  Has been on Protonix.  Reflux well controlled. Alt diarrhea and constipation ever since PD catheter July 2020.  Diarrhea lasts for 1 to 2 days and then she gets constipated.  She would pass hard stools at times.  No melena or hematochezia.  No hematemesis.  Has chronic anemia d/t ESRD. S/P multiple transfusions.  Is on transplant list for renal transplant at [place] EGD/colonoscopy.  Has family history of colon cancer-sister at age 63.  Lost 15lb over 1 year.  Had covid 43 in Jan 2021.  Has functioning right arm AV fistula.  No regurgitation, odynophagia or dysphagia.  No abdominal pain or pain around PD catheter.   Past Medical History:  Diagnosis Date  . Anemia   . Chronic kidney disease 02/22/2018   ESRD-dialysis, training to do dialysis at home. Currently does  dialysis every day.   . Dialysis patient ([place])   . GERD (gastroesophageal reflux disease)   . Hemolacria   . History of gestational diabetes 06/29/2012  . Hypertension     Past Surgical History:  Procedure Laterality Date  . AV FISTULA PLACEMENT Right 03/02/2018   Procedure: ARTERIOVENOUS (AV) FISTULA CREATION;  Surgeon: Waynetta Sandy, MD;  Location: [place];  Service: Vascular;  Laterality: Right;  . ENDOVENOUS ABLATION SAPHENOUS VEIN W/ LASER    . HYSTEROSCOPY WITH D & C N/A 09/13/2018   Procedure: DILATATION AND CURETTAGE WITH MINERVA ABLATION;  Surgeon: Emily Filbert, MD;  Location: [place];  Service: Gynecology;  Laterality: N/A;  Rep will be here confirmed on 09/06/18 CS  . IR FLUORO GUIDE CV LINE RIGHT  02/24/2018  . IR US GUIDE VASC ACCESS RIGHT  02/24/2018  . LAPAROSCOPY    . TUBAL LIGATION      Family History  Problem Relation Age of Onset  . Diabetes Father   . Hypertension Father   . Diabetes Mother   . Hypertension Mother   . Colon cancer Sister 59  . Wilm's tumor Sister   . Colon polyps Neg Hx   . Esophageal cancer Neg Hx   . Rectal cancer Neg Hx   . Stomach cancer Neg Hx     Social History   Tobacco Use  . Smoking status: Never Smoker  . Smokeless tobacco: Never Used  Substance Use Topics  . Alcohol use: Not Currently  . Drug use: Never    Current  Outpatient Medications  Medication Sig Dispense Refill  . albuterol (VENTOLIN HFA) 108 (90 Base) MCG/ACT inhaler Inhale 2 puffs into the lungs every 6 (six) hours as needed for wheezing. 6.7 g 2  . calcitRIOL (ROCALTROL) 0.5 MCG capsule Take 0.5 mcg by mouth daily.     . Cholecalciferol 1.25 MG (50000 UT) TABS Take 1 tablet by mouth once a week. 12 tablet 0  . cinacalcet (SENSIPAR) 60 MG tablet Take 60 mg by mouth daily.     . ferric citrate (AURYXIA) 1 GM 210 MG(Fe) tablet Take 3 tablets (630 mg total) by mouth 3 (three) times daily with meals. (Patient taking differently: Take 630 mg by mouth 3 (three)  times daily with meals. 2 tablets with a snack) 270 tablet 0  . Melatonin-Pyridoxine (MELATIN PO) Take by mouth at bedtime as needed for sleep.    . Methoxy PEG-Epoetin Beta (MIRCERA IJ) Inject into the skin as needed.     . pantoprazole (PROTONIX) 40 MG tablet TAKE 1 TABLET BY MOUTH DAILY BEFORE BREAKFAST. (Patient taking differently: Take 40 mg by mouth daily. ) 90 tablet 0   No current facility-administered medications for this visit.    Allergies  Allergen Reactions  . Camellia Other (See Comments)    Irregular menses  . Lisinopril Other (See Comments)    irregular menses    Review of Systems:  Constitutional: Denies fever, chills, diaphoresis, appetite change and fatigue.  HEENT: Denies photophobia, eye pain, redness, hearing loss, ear pain, congestion, sore throat, rhinorrhea, sneezing, mouth sores, neck pain, neck stiffness and tinnitus.   Respiratory: Denies SOB, DOE, cough, chest tightness,  and had wheezing (negative chest x-ray).   Cardiovascular: Denies chest pain, palpitations and leg swelling.  Genitourinary: Denies dysuria, urgency, frequency, hematuria, flank pain and difficulty urinating.  Musculoskeletal: Denies myalgias, back pain, joint swelling, arthralgias and gait problem.  Skin: No rash.  Neurological: Denies dizziness, seizures, syncope, weakness, light-headedness, numbness and headaches.  Hematological: Denies adenopathy. Easy bruising, personal or family bleeding history  Psychiatric/Behavioral: No anxiety or depression     Physical Exam:    BP 90/60   Pulse 88   Temp 97.6 F (36.4 C)   Ht 5\' 7"  (1.702 m)   Wt 143 lb 8 oz (65.1 kg)   BMI 22.48 kg/m  Wt Readings from Last 3 Encounters:  04/17/19 143 lb 8 oz (65.1 kg)  04/10/19 144 lb 1.9 oz (65.4 kg)  04/05/19 146 lb (66.2 kg)   Constitutional:  Well-developed, in no acute distress. Psychiatric: Normal mood and affect. Behavior is normal. HEENT: Pupils normal.  Conjunctivae are normal. No  scleral icterus. Neck supple.  Cardiovascular: Normal rate, regular rhythm. No edema Pulmonary/chest: Effort normal and breath sounds normal. No wheezing today. Abdominal: Soft, nondistended. Nontender. Bowel sounds active throughout. There are no masses palpable. No hepatomegaly.  PD catheter in the right lower quadrant.  Well-healed surgical scars. Rectal:  defered Neurological: Alert and oriented to person place and time. Skin: Skin is warm and dry. No rashes noted.  Data Reviewed: I have personally reviewed following labs and imaging studies  CBC: CBC Latest Ref Rng & Units 04/10/2019 03/10/2019 03/09/2019  WBC 3.8 - 10.8 Thousand/uL 8.5 - 8.5  Hemoglobin 11.7 - 15.5 g/dL 7.0(L) 7.5(L) 6.1(LL)  Hematocrit 35.0 - 45.0 % 21.1(L) 21.9(L) 18.0(L)  Platelets 140 - 400 Thousand/uL 263 - 281    CMP: CMP Latest Ref Rng & Units 03/09/2019 09/12/2018 07/26/2018  Glucose 70 - 99 mg/dL 141(H) 96 -  BUN 6 - 20 mg/dL 59(H) 111(H) -  Creatinine 0.44 - 1.00 mg/dL 17.75(H) 19.14(H) -  Sodium 135 - 145 mmol/L 135 134(L) -  Potassium 3.5 - 5.1 mmol/L 3.3(L) 4.9 5.9(A)  Chloride 98 - 111 mmol/L 96(L) 97(L) -  CO2 22 - 32 mmol/L 24 19(L) -  Calcium 8.9 - 10.3 mg/dL 9.3 10.5(H) -  Total Protein 6.5 - 8.1 g/dL 7.1 8.3(H) -  Total Bilirubin 0.3 - 1.2 mg/dL 0.6 0.6 -  Alkaline Phos 38 - 126 U/L 94 90 79  AST 15 - 41 U/L 12(L) 12(L) 13  ALT 0 - 44 U/L 18 14 28       Radiology Studies: DG Chest 2 View  Result Date: 04/10/2019 CLINICAL DATA:  Orthopnea, dry cough, wheezing EXAM: CHEST - 2 VIEW COMPARISON:  03/09/2019 FINDINGS: The heart size and mediastinal contours are within normal limits. Both lungs are clear. The visualized skeletal structures are unremarkable. IMPRESSION: No active cardiopulmonary disease. Electronically Signed   By: Randa Ngo M.D.   On: 04/10/2019 09:42      Carmell Austria, MD 04/17/2019, 8:59 AM  Cc: Samuel Bouche, NP

## 2019-04-19 ENCOUNTER — Encounter: Payer: Medicare Other | Admitting: Gastroenterology

## 2019-04-19 NOTE — Progress Notes (Signed)
[facility] NOTE  Patient Care Team: Samuel Bouche, NP as PCP - General (Nurse Practitioner) Silverio Decamp, MD as Consulting Physician (Sports Medicine) Elza Rafter, MD as Consulting Physician (Family Medicine) Dwana Melena, MD as Consulting Physician (Nephrology)  HEME/ONC OVERVIEW: 1. Anemia in ESRD  TREATMENT SUMMARY:  PRN IV iron   ASSESSMENT & PLAN:   Anemia in ESRD -I reviewed the patient's records in detail, including PCP clinic notes, lab studies, and imaging results -In summary, patient was diagnosed with acute, severe renal failure in 02/2018 after coming home from a trip to [place].  She also developed progressive anemia after being diagnosed with acute kidney failure, with hemoglobin as low as in the mid-5's, requiring periodic RBC transfusion.  Patient has been on peritoneal dialysis for ESRD since 08/2018.  Recent iron profile showed anemia of chronic disease.  Review of renal ultrasound in 2020 showed medical renal disease.  She has been receiving sporadic ESA injection, most recently 235mg on 04/04/2019.  Patient is referred to hematology for further management of anemia. -I reviewed the lab results in detail with patient -Given the patient's ESRD on dialysis, her anemia is most likely secondary to underlying renal failure -Hgb 8.4 today, improving  -I personally reviewed the patient's peripheral blood smear today.  The red blood cells were of normal morphology.  There was no schistocytosis.  The white blood cells were of normal morphology. There were no peripheral circulating blasts. The platelets were of normal size and I verified that there were no platelet clumping. -I have ordered repeat iron panel, including soluble transferrin, to assess for any concurrent iron deficiency in addition to anemia of chronic disease, as well as B12 and folate to rule out other nutritional deficiencies -We discussed the risks, benefits, side effects of  erythropoietin stimulating agents for anemia, such as Aranesp, with the goal of keeping the hemoglobin no greater than 11g. -Some of the potential side effects include allergic reaction, skin rashes, headache, severe hypertension, and thrombosis, such as stroke and heart attack. There is a rare risk of causing growth of cancers. She agreed to proceed. -As she has only been receiving low dose ESA sporadically, I will start the initiial dosing at 3045m every 2 weeks and we will reassess her response rates after 2 treatments to assess whether she needs adjustment in the dose or frequency.  -Given the unexplained kidney failure, I have also ordered SPEP, IFE, and free light chains at the next visit to rule out plasma cell dyscrasia  -Finally, she has an appt with gastroenterology on 05/02/2019 to discuss possible GI work-up to rule out bleeding   ESRD -Etiology unclear -For unknown reason, she was found with severe acute renal failure over a year ago, but has not had any kidney biopsy to further assess the cause  -[place] [facility] including kidney biopsy, as well as consideration for transplant -See plasma cell dyscrasia work-up as above -I strongly encouraged the patient [facility] the renal failure, given her young age, absence of significant comorbidities, and severity of renal failure   Orders Placed This Encounter  Procedures  . CBC with Differential (Cancer Center Only)    Standing Status:   Future    Standing Expiration Date:   05/31/2020  . CMP ([facility])    Standing  Status:   Future    Standing Expiration Date:   05/31/2020  . Multiple Myeloma Panel (SPEP&IFE w/QIG)    Standing Status:   Future    Standing Expiration Date:   05/31/2020  . Kappa/lambda light chains    Standing Status:   Future    Standing  Expiration Date:   10/26/2020  . CBC with Differential (Cancer Center Only)    Standing Status:   Standing    Number of Occurrences:   20    Standing Expiration Date:   04/26/2020   The total time spent in the encounter was 65 minutes, including face-to-face time with the patient, review of various tests results, order additional studies/medications, documentation, and coordination of care plan.   All questions were answered. The patient knows to call the clinic with any problems, questions or concerns. No barriers to learning was detected.  Return in one week for Aranesp injection.  Return in 5 weeks for labs and clinic appt.   Tish Men, MD 4/9/20219:43 AM  CHIEF COMPLAINTS/PURPOSE OF CONSULTATION:  "I am doing okay"  HISTORY OF PRESENTING ILLNESS:  [name] 44 y.o. female is here because of anemia secondary to ESRD.  Patient was diagnosed with acute, severe renal failure in 02/2018 after coming home from a trip to [place].  She also developed progressive anemia after being diagnosed with acute kidney failure, with hemoglobin as low as in the mid-5's, requiring periodic RBC transfusion.  Patient has been on peritoneal dialysis for ESRD since 08/2018.  Recent iron profile showed anemia of chronic disease.  Review of renal ultrasound in 2020 showed medical renal disease.  She has been receiving sporadic ESA injection, most recently 284mg on 04/04/2019.  Patient is referred to hematology for further management of anemia.  Patient reports that she has persistent fatigue, but denies any symptoms of bleeding.  She has not had menstrual cycle for some time.  [facility] [place] [facility] [place] [facility] [place] [facility].    REVIEW OF SYSTEMS:   Constitutional: ( - ) fevers, ( - )  chills , ( - ) night sweats Eyes: ( - ) blurriness of vision, ( - ) double vision, ( - ) watery eyes Ears, nose, mouth,  throat, and face: ( - ) mucositis, ( - ) sore throat Respiratory: ( - ) cough, ( - ) dyspnea, ( - ) wheezes Cardiovascular: ( - ) palpitation, ( - ) chest discomfort, ( + ) lower extremity swelling Gastrointestinal:  ( - ) nausea, ( - ) heartburn, ( - ) change in bowel habits Skin: ( - ) abnormal skin rashes Lymphatics: ( - ) new lymphadenopathy, ( - ) easy bruising Neurological: ( - ) numbness, ( - ) tingling, ( - ) new weaknesses Behavioral/Psych: ( - ) mood change, ( - ) new changes  All other systems were reviewed with the patient and are negative.  I have reviewed her chart and materials related to her cancer extensively and collaborated history with the patient. Summary of oncologic history is as follows: Oncology History   No history exists.    MEDICAL HISTORY:  Past Medical History:  Diagnosis Date  . Anemia   . Chronic kidney disease 02/22/2018   ESRD-dialysis, training to do dialysis at home. Currently does dialysis every day.   [place]   . GERD (gastroesophageal reflux disease)   . Hemolacria   .  History of gestational diabetes 06/29/2012  . Hypertension     SURGICAL HISTORY: Past Surgical History:  Procedure Laterality Date  . AV FISTULA PLACEMENT Right 03/02/2018   Procedure: ARTERIOVENOUS (AV) FISTULA CREATION;  Surgeon: Waynetta Sandy, MD;  Location: [facility];  Service: Vascular;  Laterality: Right;  . ENDOVENOUS ABLATION SAPHENOUS VEIN W/ LASER    . HYSTEROSCOPY WITH D & C N/A 09/13/2018   Procedure: DILATATION AND CURETTAGE WITH MINERVA ABLATION;  Surgeon: Emily Filbert, MD;  Location: [facility];  Service: Gynecology;  Laterality: N/A;  Rep will be here confirmed on 09/06/18 CS  . IR FLUORO GUIDE CV LINE RIGHT  02/24/2018  . IR US GUIDE VASC ACCESS RIGHT  02/24/2018  . LAPAROSCOPY    . TUBAL LIGATION      SOCIAL HISTORY: Social History   Socioeconomic History  . Marital status: Married    Spouse name: Not on file  . Number of children: 3   . Years of education: Not on file  . Highest education level: Not on file  Occupational History  . Occupation: homemaker  Tobacco Use  . Smoking status: Never Smoker  . Smokeless tobacco: Never Used  Substance and Sexual Activity  . Alcohol use: Not Currently  . Drug use: Never  . Sexual activity: Yes    Birth control/protection: Surgical, None  Other Topics Concern  . Not on file  Social History Narrative  . Not on file   Social Determinants of Health   Financial Resource Strain:   . Difficulty of Paying Living Expenses:   Food Insecurity:   . Worried About Charity fundraiser in the Last Year:   . Arboriculturist in the Last Year:   Transportation Needs:   . Film/video editor (Medical):   Marland Kitchen Lack of Transportation (Non-Medical):   Physical Activity:   . Days of Exercise per Week:   . Minutes of Exercise per Session:   Stress:   . Feeling of Stress :   Social Connections:   . Frequency of Communication with Friends and Family:   . Frequency of Social Gatherings with Friends and Family:   . Attends Religious Services:   . Active Member of Clubs or Organizations:   . Attends Archivist Meetings:   Marland Kitchen Marital Status:   Intimate Partner Violence:   . Fear of Current or Ex-Partner:   . Emotionally Abused:   Marland Kitchen Physically Abused:   . Sexually Abused:     FAMILY HISTORY: Family History  Problem Relation Age of Onset  . Diabetes Father   . Hypertension Father   . Diabetes Mother   . Hypertension Mother   . Colon cancer Sister 72  . Wilm's tumor Sister   . Colon polyps Neg Hx   . Esophageal cancer Neg Hx   . Rectal cancer Neg Hx   . Stomach cancer Neg Hx     ALLERGIES:  is allergic to camellia and lisinopril.  MEDICATIONS:  Current Outpatient Medications  Medication Sig Dispense Refill  . albuterol (VENTOLIN HFA) 108 (90 Base) MCG/ACT inhaler Inhale 2 puffs into the lungs every 6 (six) hours as needed for wheezing. 6.7 g 2  . calcitRIOL  (ROCALTROL) 0.5 MCG capsule Take 0.5 mcg by mouth daily.     . Cholecalciferol 1.25 MG (50000 UT) TABS Take 1 tablet by mouth once a week. 12 tablet 0  . cinacalcet (SENSIPAR) 60 MG tablet Take 60 mg by mouth daily.     Marland Kitchen  ferric citrate (AURYXIA) 1 GM 210 MG(Fe) tablet Take 3 tablets (630 mg total) by mouth 3 (three) times daily with meals. (Patient taking differently: Take 630 mg by mouth 3 (three) times daily with meals. 2 tablets with a snack) 270 tablet 0  . Melatonin-Pyridoxine (MELATIN PO) Take by mouth at bedtime as needed for sleep.    . Methoxy PEG-Epoetin Beta (MIRCERA IJ) Inject into the skin as needed.     . ondansetron (ZOFRAN ODT) 4 MG disintegrating tablet Take 1 tablet (4 mg total) by mouth every 8 (eight) hours as needed for nausea or vomiting. 30 tablet 0  . pantoprazole (PROTONIX) 40 MG tablet TAKE 1 TABLET BY MOUTH DAILY BEFORE BREAKFAST. (Patient taking differently: Take 40 mg by mouth daily. ) 90 tablet 0   No current facility-administered medications for this visit.    PHYSICAL EXAMINATION: ECOG PERFORMANCE STATUS: 0 - Asymptomatic  Vitals:   04/27/19 0907  BP: 99/61  Pulse: 81  Resp: 18  Temp: (!) 97.5 F (36.4 C)  SpO2: 100%   Filed Weights   04/27/19 0907  Weight: 140 lb (63.5 kg)    GENERAL: alert, no distress and comfortable SKIN: skin color, texture, turgor are normal, no rashes or significant lesions EYES: conjunctiva are pink and non-injected, sclera clear OROPHARYNX: no exudate, no erythema; lips, buccal mucosa, and tongue normal  NECK: supple, non-tender LUNGS: clear to auscultation with normal breathing effort HEART: regular rate & rhythm, no murmurs, no lower extremity edema ABDOMEN: soft, non-tender, non-distended, normal bowel sounds Musculoskeletal: no cyanosis of digits and no clubbing  PSYCH: alert & oriented x 3, fluent speech  LABORATORY DATA:  I have reviewed the data as listed Lab Results  Component Value Date   WBC 8.8  04/27/2019   HGB 8.4 (L) 04/27/2019   HCT 24.9 (L) 04/27/2019   MCV 98.8 04/27/2019   PLT 340 04/27/2019   Lab Results  Component Value Date   NA 133 (L) 04/27/2019   K 4.1 04/27/2019   CL 89 (L) 04/27/2019   CO2 21 (L) 04/27/2019    RADIOGRAPHIC STUDIES: I have personally reviewed the radiological images as listed and agreed with the findings in the report. DG Chest 2 View  Result Date: 04/10/2019 CLINICAL DATA:  Orthopnea, dry cough, wheezing EXAM: CHEST - 2 VIEW COMPARISON:  03/09/2019 FINDINGS: The heart size and mediastinal contours are within normal limits. Both lungs are clear. The visualized skeletal structures are unremarkable. IMPRESSION: No active cardiopulmonary disease. Electronically Signed   By: Randa Ngo M.D.   On: 04/10/2019 09:42    PATHOLOGY: I have reviewed the pathology reports as documented in the oncologist history.

## 2019-04-23 ENCOUNTER — Other Ambulatory Visit: Payer: Self-pay | Admitting: Hematology

## 2019-04-23 DIAGNOSIS — N186 End stage renal disease: Secondary | ICD-10-CM

## 2019-04-23 DIAGNOSIS — D631 Anemia in chronic kidney disease: Secondary | ICD-10-CM

## 2019-04-23 DIAGNOSIS — D539 Nutritional anemia, unspecified: Secondary | ICD-10-CM

## 2019-04-27 ENCOUNTER — Inpatient Hospital Stay: Payer: Medicare Other | Attending: Hematology | Admitting: Hematology

## 2019-04-27 ENCOUNTER — Telehealth: Payer: Self-pay | Admitting: Hematology

## 2019-04-27 ENCOUNTER — Encounter: Payer: Self-pay | Admitting: Hematology

## 2019-04-27 ENCOUNTER — Other Ambulatory Visit: Payer: Self-pay

## 2019-04-27 ENCOUNTER — Inpatient Hospital Stay: Payer: Medicare Other

## 2019-04-27 VITALS — BP 99/61 | HR 81 | Temp 97.5°F | Resp 18 | Ht 67.0 in | Wt 140.0 lb

## 2019-04-27 DIAGNOSIS — K219 Gastro-esophageal reflux disease without esophagitis: Secondary | ICD-10-CM | POA: Insufficient documentation

## 2019-04-27 DIAGNOSIS — I120 Hypertensive chronic kidney disease with stage 5 chronic kidney disease or end stage renal disease: Secondary | ICD-10-CM | POA: Diagnosis not present

## 2019-04-27 DIAGNOSIS — Z992 Dependence on renal dialysis: Secondary | ICD-10-CM | POA: Diagnosis not present

## 2019-04-27 DIAGNOSIS — N186 End stage renal disease: Secondary | ICD-10-CM

## 2019-04-27 DIAGNOSIS — Z79899 Other long term (current) drug therapy: Secondary | ICD-10-CM | POA: Diagnosis not present

## 2019-04-27 DIAGNOSIS — D539 Nutritional anemia, unspecified: Secondary | ICD-10-CM

## 2019-04-27 DIAGNOSIS — D631 Anemia in chronic kidney disease: Secondary | ICD-10-CM

## 2019-04-27 LAB — VITAMIN B12: Vitamin B-12: 591 pg/mL (ref 180–914)

## 2019-04-27 LAB — CBC WITH DIFFERENTIAL (CANCER CENTER ONLY)
Abs Immature Granulocytes: 0.03 10*3/uL (ref 0.00–0.07)
Basophils Absolute: 0 10*3/uL (ref 0.0–0.1)
Basophils Relative: 1 %
Eosinophils Absolute: 0.2 10*3/uL (ref 0.0–0.5)
Eosinophils Relative: 2 %
HCT: 24.9 % — ABNORMAL LOW (ref 36.0–46.0)
Hemoglobin: 8.4 g/dL — ABNORMAL LOW (ref 12.0–15.0)
Immature Granulocytes: 0 %
Lymphocytes Relative: 21 %
Lymphs Abs: 1.9 10*3/uL (ref 0.7–4.0)
MCH: 33.3 pg (ref 26.0–34.0)
MCHC: 33.7 g/dL (ref 30.0–36.0)
MCV: 98.8 fL (ref 80.0–100.0)
Monocytes Absolute: 0.5 10*3/uL (ref 0.1–1.0)
Monocytes Relative: 6 %
Neutro Abs: 6.1 10*3/uL (ref 1.7–7.7)
Neutrophils Relative %: 70 %
Platelet Count: 340 10*3/uL (ref 150–400)
RBC: 2.52 MIL/uL — ABNORMAL LOW (ref 3.87–5.11)
RDW: 15.1 % (ref 11.5–15.5)
WBC Count: 8.8 10*3/uL (ref 4.0–10.5)
nRBC: 0 % (ref 0.0–0.2)

## 2019-04-27 LAB — CMP (CANCER CENTER ONLY)
ALT: 11 U/L (ref 0–44)
AST: 7 U/L — ABNORMAL LOW (ref 15–41)
Albumin: 4.4 g/dL (ref 3.5–5.0)
Alkaline Phosphatase: 115 U/L (ref 38–126)
Anion gap: 23 — ABNORMAL HIGH (ref 5–15)
BUN: 80 mg/dL — ABNORMAL HIGH (ref 6–20)
CO2: 21 mmol/L — ABNORMAL LOW (ref 22–32)
Calcium: 10.1 mg/dL (ref 8.9–10.3)
Chloride: 89 mmol/L — ABNORMAL LOW (ref 98–111)
Creatinine: 17.98 mg/dL (ref 0.44–1.00)
GFR, Est AFR Am: 2 mL/min — ABNORMAL LOW (ref >60)
GFR, Estimated: 2 mL/min — ABNORMAL LOW (ref >60)
Glucose, Bld: 117 mg/dL — ABNORMAL HIGH (ref 70–99)
Potassium: 4.1 mmol/L (ref 3.5–5.1)
Sodium: 133 mmol/L — ABNORMAL LOW (ref 135–145)
Total Bilirubin: 0.4 mg/dL (ref 0.3–1.2)
Total Protein: 7.6 g/dL (ref 6.5–8.1)

## 2019-04-27 LAB — IRON AND TIBC
Iron: 152 ug/dL — ABNORMAL HIGH (ref 41–142)
Saturation Ratios: 103 % — ABNORMAL HIGH (ref 21–57)
TIBC: 148 ug/dL — ABNORMAL LOW (ref 236–444)
UIBC: UNDETERMINED ug/dL (ref 120–384)

## 2019-04-27 LAB — FERRITIN: Ferritin: 2737 ng/mL — ABNORMAL HIGH (ref 11–307)

## 2019-04-27 LAB — FOLATE: Folate: 42.3 ng/mL (ref >5.9)

## 2019-04-27 LAB — SAVE SMEAR(SSMR), FOR PROVIDER SLIDE REVIEW

## 2019-04-27 NOTE — Telephone Encounter (Signed)
Called and LMVM for patient w/ updated appointments info per 4/9 los

## 2019-04-28 LAB — ERYTHROPOIETIN: Erythropoietin: 6.4 m[IU]/mL (ref 2.6–18.5)

## 2019-04-30 LAB — SOLUBLE TRANSFERRIN RECEPTOR: Transferrin Receptor: 19.5 nmol/L (ref 12.2–27.3)

## 2019-05-01 ENCOUNTER — Other Ambulatory Visit: Payer: Self-pay | Admitting: Gastroenterology

## 2019-05-01 ENCOUNTER — Ambulatory Visit (INDEPENDENT_AMBULATORY_CARE_PROVIDER_SITE_OTHER): Payer: Medicare Other

## 2019-05-01 DIAGNOSIS — Z1159 Encounter for screening for other viral diseases: Secondary | ICD-10-CM

## 2019-05-01 LAB — SARS CORONAVIRUS 2 (TAT 6-24 HRS): SARS Coronavirus 2: NEGATIVE

## 2019-05-02 ENCOUNTER — Telehealth: Payer: Self-pay | Admitting: *Deleted

## 2019-05-02 ENCOUNTER — Inpatient Hospital Stay: Payer: Medicare Other

## 2019-05-02 ENCOUNTER — Other Ambulatory Visit: Payer: Self-pay

## 2019-05-02 VITALS — BP 103/83 | HR 72 | Temp 97.2°F | Resp 17

## 2019-05-02 DIAGNOSIS — D631 Anemia in chronic kidney disease: Secondary | ICD-10-CM

## 2019-05-02 DIAGNOSIS — N186 End stage renal disease: Secondary | ICD-10-CM

## 2019-05-02 LAB — CBC WITH DIFFERENTIAL (CANCER CENTER ONLY)
Abs Immature Granulocytes: 0.03 10*3/uL (ref 0.00–0.07)
Basophils Absolute: 0 10*3/uL (ref 0.0–0.1)
Basophils Relative: 0 %
Eosinophils Absolute: 0.2 10*3/uL (ref 0.0–0.5)
Eosinophils Relative: 3 %
HCT: 24.4 % — ABNORMAL LOW (ref 36.0–46.0)
Hemoglobin: 8.4 g/dL — ABNORMAL LOW (ref 12.0–15.0)
Immature Granulocytes: 0 %
Lymphocytes Relative: 16 %
Lymphs Abs: 1.3 10*3/uL (ref 0.7–4.0)
MCH: 33.9 pg (ref 26.0–34.0)
MCHC: 34.4 g/dL (ref 30.0–36.0)
MCV: 98.4 fL (ref 80.0–100.0)
Monocytes Absolute: 0.4 10*3/uL (ref 0.1–1.0)
Monocytes Relative: 6 %
Neutro Abs: 6.1 10*3/uL (ref 1.7–7.7)
Neutrophils Relative %: 75 %
Platelet Count: 313 10*3/uL (ref 150–400)
RBC: 2.48 MIL/uL — ABNORMAL LOW (ref 3.87–5.11)
RDW: 14.4 % (ref 11.5–15.5)
WBC Count: 8.1 10*3/uL (ref 4.0–10.5)
nRBC: 0 % (ref 0.0–0.2)

## 2019-05-02 MED ORDER — DARBEPOETIN ALFA 300 MCG/0.6ML IJ SOSY
PREFILLED_SYRINGE | INTRAMUSCULAR | Status: AC
  Filled 2019-05-02: qty 0.6

## 2019-05-02 MED ORDER — DARBEPOETIN ALFA 300 MCG/0.6ML IJ SOSY
300.0000 ug | PREFILLED_SYRINGE | Freq: Once | INTRAMUSCULAR | Status: AC
  Administered 2019-05-02: 300 ug via SUBCUTANEOUS

## 2019-05-02 NOTE — Telephone Encounter (Signed)
Dr. Maylon Peppers notified of creat-16.62.  No new orders received at this time.

## 2019-05-02 NOTE — Patient Instructions (Signed)
Darbepoetin Alfa injection What is this medicine? DARBEPOETIN ALFA (dar be POE e tin AL fa) helps your body make more red blood cells. It is used to treat anemia caused by chronic kidney failure and chemotherapy. This medicine may be used for other purposes; ask your health care provider or pharmacist if you have questions. COMMON BRAND NAME(S): Aranesp What should I tell my health care provider before I take this medicine? They need to know if you have any of these conditions:  blood clotting disorders or history of blood clots  cancer patient not on chemotherapy  cystic fibrosis  heart disease, such as angina, heart failure, or a history of a heart attack  hemoglobin level of 12 g/dL or greater  high blood pressure  low levels of folate, iron, or vitamin B12  seizures  an unusual or allergic reaction to darbepoetin, erythropoietin, albumin, hamster proteins, latex, other medicines, foods, dyes, or preservatives  pregnant or trying to get pregnant  breast-feeding How should I use this medicine? This medicine is for injection into a vein or under the skin. It is usually given by a health care professional in a hospital or clinic setting. If you get this medicine at home, you will be taught how to prepare and give this medicine. Use exactly as directed. Take your medicine at regular intervals. Do not take your medicine more often than directed. It is important that you put your used needles and syringes in a special sharps container. Do not put them in a trash can. If you do not have a sharps container, call your pharmacist or healthcare provider to get one. A special MedGuide will be given to you by the pharmacist with each prescription and refill. Be sure to read this information carefully each time. Talk to your pediatrician regarding the use of this medicine in children. While this medicine may be used in children as young as 1 month of age for selected conditions, precautions do  apply. Overdosage: If you think you have taken too much of this medicine contact a poison control center or emergency room at once. NOTE: This medicine is only for you. Do not share this medicine with others. What if I miss a dose? If you miss a dose, take it as soon as you can. If it is almost time for your next dose, take only that dose. Do not take double or extra doses. What may interact with this medicine? Do not take this medicine with any of the following medications:  epoetin alfa This list may not describe all possible interactions. Give your health care provider a list of all the medicines, herbs, non-prescription drugs, or dietary supplements you use. Also tell them if you smoke, drink alcohol, or use illegal drugs. Some items may interact with your medicine. What should I watch for while using this medicine? Your condition will be monitored carefully while you are receiving this medicine. You may need blood work done while you are taking this medicine. This medicine may cause a decrease in vitamin B6. You should make sure that you get enough vitamin B6 while you are taking this medicine. Discuss the foods you eat and the vitamins you take with your health care professional. What side effects may I notice from receiving this medicine? Side effects that you should report to your doctor or health care professional as soon as possible:  allergic reactions like skin rash, itching or hives, swelling of the face, lips, or tongue  breathing problems  changes in   vision  chest pain  confusion, trouble speaking or understanding  feeling faint or lightheaded, falls  high blood pressure  muscle aches or pains  pain, swelling, warmth in the leg  rapid weight gain  severe headaches  sudden numbness or weakness of the face, arm or leg  trouble walking, dizziness, loss of balance or coordination  seizures (convulsions)  swelling of the ankles, feet, hands  unusually weak or  tired Side effects that usually do not require medical attention (report to your doctor or health care professional if they continue or are bothersome):  diarrhea  fever, chills (flu-like symptoms)  headaches  nausea, vomiting  redness, stinging, or swelling at site where injected This list may not describe all possible side effects. Call your doctor for medical advice about side effects. You may report side effects to FDA at 1-800-FDA-1088. Where should I keep my medicine? Keep out of the reach of children. Store in a refrigerator between 2 and 8 degrees C (36 and 46 degrees F). Do not freeze. Do not shake. Throw away any unused portion if using a single-dose vial. Throw away any unused medicine after the expiration date. NOTE: This sheet is a summary. It may not cover all possible information. If you have questions about this medicine, talk to your doctor, pharmacist, or health care provider.  2020 Elsevier/Gold Standard (2017-01-19 16:44:20)  

## 2019-05-03 ENCOUNTER — Ambulatory Visit: Payer: Medicare Other

## 2019-05-03 ENCOUNTER — Ambulatory Visit (AMBULATORY_SURGERY_CENTER): Payer: Medicare Other | Admitting: Gastroenterology

## 2019-05-03 ENCOUNTER — Encounter: Payer: Self-pay | Admitting: Gastroenterology

## 2019-05-03 ENCOUNTER — Other Ambulatory Visit: Payer: Medicare Other

## 2019-05-03 VITALS — BP 130/65 | HR 78 | Temp 96.8°F | Resp 19 | Ht 67.0 in | Wt 143.0 lb

## 2019-05-03 DIAGNOSIS — K635 Polyp of colon: Secondary | ICD-10-CM | POA: Diagnosis not present

## 2019-05-03 DIAGNOSIS — K5730 Diverticulosis of large intestine without perforation or abscess without bleeding: Secondary | ICD-10-CM

## 2019-05-03 DIAGNOSIS — K2950 Unspecified chronic gastritis without bleeding: Secondary | ICD-10-CM

## 2019-05-03 DIAGNOSIS — K648 Other hemorrhoids: Secondary | ICD-10-CM

## 2019-05-03 DIAGNOSIS — R197 Diarrhea, unspecified: Secondary | ICD-10-CM | POA: Diagnosis not present

## 2019-05-03 DIAGNOSIS — K6289 Other specified diseases of anus and rectum: Secondary | ICD-10-CM

## 2019-05-03 DIAGNOSIS — K6389 Other specified diseases of intestine: Secondary | ICD-10-CM

## 2019-05-03 DIAGNOSIS — D123 Benign neoplasm of transverse colon: Secondary | ICD-10-CM

## 2019-05-03 DIAGNOSIS — K621 Rectal polyp: Secondary | ICD-10-CM | POA: Diagnosis not present

## 2019-05-03 DIAGNOSIS — K219 Gastro-esophageal reflux disease without esophagitis: Secondary | ICD-10-CM

## 2019-05-03 DIAGNOSIS — D122 Benign neoplasm of ascending colon: Secondary | ICD-10-CM

## 2019-05-03 DIAGNOSIS — D649 Anemia, unspecified: Secondary | ICD-10-CM

## 2019-05-03 DIAGNOSIS — R112 Nausea with vomiting, unspecified: Secondary | ICD-10-CM

## 2019-05-03 LAB — CMP (CANCER CENTER ONLY)
ALT: 12 U/L (ref 0–44)
AST: 8 U/L — ABNORMAL LOW (ref 15–41)
Albumin: 4.6 g/dL (ref 3.5–5.0)
Alkaline Phosphatase: 100 U/L (ref 38–126)
Anion gap: 23 — ABNORMAL HIGH (ref 5–15)
BUN: 66 mg/dL — ABNORMAL HIGH (ref 6–20)
CO2: 23 mmol/L (ref 22–32)
Calcium: 10.5 mg/dL — ABNORMAL HIGH (ref 8.9–10.3)
Chloride: 89 mmol/L — ABNORMAL LOW (ref 98–111)
Creatinine: 16.62 mg/dL (ref 0.44–1.00)
GFR, Est AFR Am: 3 mL/min — ABNORMAL LOW (ref >60)
GFR, Estimated: 2 mL/min — ABNORMAL LOW (ref >60)
Glucose, Bld: 111 mg/dL — ABNORMAL HIGH (ref 70–99)
Potassium: 3.7 mmol/L (ref 3.5–5.1)
Sodium: 135 mmol/L (ref 135–145)
Total Bilirubin: 0.4 mg/dL (ref 0.3–1.2)
Total Protein: 7.5 g/dL (ref 6.5–8.1)

## 2019-05-03 LAB — KAPPA/LAMBDA LIGHT CHAINS
Kappa free light chain: 243.6 mg/L — ABNORMAL HIGH (ref 3.3–19.4)
Kappa, lambda light chain ratio: 1.55 (ref 0.26–1.65)
Lambda free light chains: 157.3 mg/L — ABNORMAL HIGH (ref 5.7–26.3)

## 2019-05-03 MED ORDER — SODIUM CHLORIDE 0.9 % IV SOLN: 500.0000 mL | Freq: Once | INTRAVENOUS | Status: DC

## 2019-05-03 NOTE — Op Note (Addendum)
[facility] Patient Name: Sara Meza Procedure Date: 05/03/2019 11:07 AM MRN: [id number] Endoscopist: Jackquline Denmark , MD Age: 44 Referring MD:  Date of Birth: 11/22/1975 Gender: Female Account #: [id number] Procedure:                Colonoscopy Indications:              Screening in patient at increased risk: Family                            history of 1st-degree relative with colorectal                            cancer (sis at age 79), intermittent diarrhea Medicines:                Monitored Anesthesia Care Procedure:                Pre-Anesthesia Assessment:                           - Prior to the procedure, a History and Physical                            was performed, and patient medications and                            allergies were reviewed. The patient's tolerance of                            previous anesthesia was also reviewed. The risks                            and benefits of the procedure and the sedation                            options and risks were discussed with the patient.                            All questions were answered, and informed consent                            was obtained. Prior Anticoagulants: The patient has                            taken no previous anticoagulant or antiplatelet                            agents. ASA Grade Assessment: II - A patient with                            mild systemic disease. After reviewing the risks                            and benefits, the patient was deemed in  satisfactory condition to undergo the procedure.                           After obtaining informed consent, the colonoscope                            was passed under direct vision. Throughout the                            procedure, the patient's blood pressure, pulse, and                            oxygen saturations were monitored continuously. The                            Colonoscope was  introduced through the anus and                            advanced to the 2 cm into the ileum. The                            colonoscopy was performed without difficulty. The                            patient tolerated the procedure well. The quality                            of the bowel preparation was good. The terminal                            ileum, ileocecal valve, appendiceal orifice, and                            rectum were photographed. Scope In: 11:21:14 AM Scope Out: 11:41:56 AM Scope Withdrawal Time: 0 hours 15 minutes 19 seconds  Total Procedure Duration: 0 hours 20 minutes 42 seconds  Findings:                 An area of mild melanosis was found in the entire                            colon. Biopsies were taken with a cold forceps for                            histology.                           Three sessile polyps were found in the proximal                            transverse colon and proximal ascending colon. The                            polyps were 4 to 8 mm in size. Smaller polyps were  removed with a cold snare and larger with hot                            snare. Resection and retrieval were complete.                           There were 2 small lipoma, 8 mm in diameter, in the                            distal transverse colon.                           A few small-mouthed diverticula were found in the                            sigmoid colon, descending colon and ascending colon.                           Non-bleeding internal hemorrhoids were found during                            retroflexion. The hemorrhoids were small.                           The terminal ileum appeared normal.                           The exam was otherwise without abnormality on                            direct and retroflexion views. Complications:            No immediate complications. Estimated Blood Loss:     Estimated blood loss:  none. Impression:               -Mild melanosis coli.                           -Colonic polyps s/p polypectomy.                           -Mild pancolonic diverticulosis predominantly in                            the left colon.                           -Otherwise normal colonoscopy to TI. Recommendation:           - Patient has a contact number available for                            emergencies. The signs and symptoms of potential                            delayed complications were discussed with the  patient. Return to normal activities tomorrow.                            Written discharge instructions were provided to the                            patient.                           - Resume previous diet.                           - Continue present medications.                           - Await pathology results.                           - Repeat colonoscopy for surveillance based on                            pathology results.                           - The findings and recommendations were discussed                            with the patient's family. Jackquline Denmark, MD 05/03/2019 11:53:39 AM This report has been signed electronically.

## 2019-05-03 NOTE — Progress Notes (Signed)
Pt's states no medical or surgical changes since previsit or office visit.  JB - temp CW - vitals. 

## 2019-05-03 NOTE — Patient Instructions (Signed)
Read all of the handouts given to you by your recovery room nurse.  No NSAIDS: aspirin, ibuprofen or aleve until further notice.   Thank-you for choosing [place] for your healthcare needs today.  YOU HAD AN ENDOSCOPIC PROCEDURE TODAY AT [facility]:   Refer to the procedure report that was given to you for any specific questions about what was found during the examination.  If the procedure report does not answer your questions, please call your gastroenterologist to clarify.  If you requested that your care partner not be given the details of your procedure findings, then the procedure report has been included in a sealed envelope for you to review at your convenience later.  YOU SHOULD EXPECT: Some feelings of bloating in the abdomen. Passage of more gas than usual.  Walking can help get rid of the air that was put into your GI tract during the procedure and reduce the bloating. If you had a lower endoscopy (such as a colonoscopy or flexible sigmoidoscopy) you may notice spotting of blood in your stool or on the toilet paper. If you underwent a bowel prep for your procedure, you may not have a normal bowel movement for a few days.  Please Note:  You might notice some irritation and congestion in your nose or some drainage.  This is from the oxygen used during your procedure.  There is no need for concern and it should clear up in a day or so.  SYMPTOMS TO REPORT IMMEDIATELY:   Following lower endoscopy (colonoscopy or flexible sigmoidoscopy):  Excessive amounts of blood in the stool  Significant tenderness or worsening of abdominal pains  Swelling of the abdomen that is new, acute  Fever of 100F or higher   Following upper endoscopy (EGD)  Vomiting of blood or coffee ground material  New chest pain or pain under the shoulder blades  Painful or persistently difficult swallowing  New shortness of breath  Fever of 100F or higher  Black, tarry-looking stools  For urgent or  emergent issues, a gastroenterologist can be reached at any hour by calling [phone]. Do not use MyChart messaging for urgent concerns.    DIET:  We do recommend a small meal at first, but then you may proceed to your regular diet.  Drink plenty of fluids but you should avoid alcoholic beverages for 24 hours. Try to increase the fiber in your diet, and drink plenty of water.  ACTIVITY:  You should plan to take it easy for the rest of today and you should NOT DRIVE or use heavy machinery until tomorrow (because of the sedation medicines used during the test).    FOLLOW UP: Our staff will call the number listed on your records 48-72 hours following your procedure to check on you and address any questions or concerns that you may have regarding the information given to you following your procedure. If we do not reach you, we will leave a message.  We will attempt to reach you two times.  During this call, we will ask if you have developed any symptoms of COVID 19. If you develop any symptoms (ie: fever, flu-like symptoms, shortness of breath, cough etc.) before then, please call [phone].  If you test positive for Covid 19 in the 2 weeks post procedure, please call and report this information to [place].    If any biopsies were taken you will be contacted by phone or by letter within the next 1-3 weeks.  Please call us  at [phone] if you have not heard about the biopsies in 3 weeks.    SIGNATURES/CONFIDENTIALITY: You and/or your care partner have signed paperwork which will be entered into your electronic medical record.  These signatures attest to the fact that that the information above on your After Visit Summary has been reviewed and is understood.  Full responsibility of the confidentiality of this discharge information lies with you and/or your care-partner.

## 2019-05-03 NOTE — Progress Notes (Signed)
Called to room to assist during endoscopic procedure.  Patient ID and intended procedure confirmed with present staff. Received instructions for my participation in the procedure from the performing physician.  

## 2019-05-03 NOTE — Progress Notes (Signed)
PT taken to PACU. Monitors in place. VSS. Report given to RN. 

## 2019-05-03 NOTE — Op Note (Signed)
[facility] Patient Name: Sara Meza Procedure Date: 05/03/2019 11:09 AM MRN: [id number] Endoscopist: Jackquline Denmark , MD Age: 44 Referring MD:  Date of Birth: 1975-03-02 Gender: Female Account #: [id number] Procedure:                Upper GI endoscopy Indications:              GERD. Intermittent N/V Medicines:                Monitored Anesthesia Care Procedure:                Pre-Anesthesia Assessment:                           - Prior to the procedure, a History and Physical                            was performed, and patient medications and                            allergies were reviewed. The patient's tolerance of                            previous anesthesia was also reviewed. The risks                            and benefits of the procedure and the sedation                            options and risks were discussed with the patient.                            All questions were answered, and informed consent                            was obtained. Prior Anticoagulants: The patient has                            taken no previous anticoagulant or antiplatelet                            agents. ASA Grade Assessment: II - A patient with                            mild systemic disease. After reviewing the risks                            and benefits, the patient was deemed in                            satisfactory condition to undergo the procedure.                           After obtaining informed consent, the endoscope was  passed under direct vision. Throughout the                            procedure, the patient's blood pressure, pulse, and                            oxygen saturations were monitored continuously. The                            Endoscope was introduced through the mouth, and                            advanced to the second part of duodenum. The upper                            GI endoscopy was accomplished  without difficulty.                            The patient tolerated the procedure well. Scope In: Scope Out: Findings:                 The examined esophagus was normal with normal                            well-defined Z-line at 38 cm. Examined by NBI.                           Stomach was J-shaped. Localized minimal                            inflammation characterized by erythema was found in                            the gastric antrum. Biopsies were taken with a cold                            forceps for histology.                           The examined duodenum was normal. Biopsies for                            histology were taken with a cold forceps for                            evaluation of celiac disease. Complications:            No immediate complications. Estimated Blood Loss:     Estimated blood loss: none. Impression:               -Minimal gastritis. Recommendation:           - Patient has a contact number available for                            emergencies. The signs and symptoms of potential  delayed complications were discussed with the                            patient. Return to normal activities tomorrow.                            Written discharge instructions were provided to the                            patient.                           - Resume previous diet.                           - Continue present medications.                           - Await pathology results.                           - No ibuprofen, naproxen, or other non-steroidal                            anti-inflammatory drugs.                           - If still with problems, would consider                            solid-phase gastric emptying scan. Jackquline Denmark, MD 05/03/2019 11:46:01 AM This report has been signed electronically.

## 2019-05-07 ENCOUNTER — Telehealth: Payer: Self-pay

## 2019-05-07 NOTE — Telephone Encounter (Signed)
  Follow up Call-  Call back number 05/03/2019  Post procedure Call Back phone  # [phone]  Permission to leave phone message Yes  Some recent data might be hidden     Patient questions:  Do you have a fever, pain , or abdominal swelling? No. Pain Score  0 *  Have you tolerated food without any problems? Yes.    Have you been able to return to your normal activities? Yes.    Do you have any questions about your discharge instructions: Diet   No. Medications  No. Follow up visit  No.  Do you have questions or concerns about your Care? No.  Actions: * If pain score is 4 or above: No action needed, pain <4. 1. Have you developed a fever since your procedure? no  2.   Have you had an respiratory symptoms (SOB or cough) since your procedure? no  3.   Have you tested positive for COVID 19 since your procedure no  4.   Have you had any family members/close contacts diagnosed with the COVID 19 since your procedure?  no   If yes to any of these questions please route to Joylene John, RN and Erenest Rasher, RN

## 2019-05-08 LAB — MULTIPLE MYELOMA PANEL, SERUM
Albumin SerPl Elph-Mcnc: 4 g/dL (ref 2.9–4.4)
Albumin/Glob SerPl: 1.2 (ref 0.7–1.7)
Alpha 1: 0.2 g/dL (ref 0.0–0.4)
Alpha2 Glob SerPl Elph-Mcnc: 1 g/dL (ref 0.4–1.0)
B-Globulin SerPl Elph-Mcnc: 0.9 g/dL (ref 0.7–1.3)
Gamma Glob SerPl Elph-Mcnc: 1.3 g/dL (ref 0.4–1.8)
Globulin, Total: 3.4 g/dL (ref 2.2–3.9)
IgA: 217 mg/dL (ref 87–352)
IgG (Immunoglobin G), Serum: 1185 mg/dL (ref 586–1602)
IgM (Immunoglobulin M), Srm: 116 mg/dL (ref 26–217)
Total Protein ELP: 7.4 g/dL (ref 6.0–8.5)

## 2019-05-11 ENCOUNTER — Encounter: Payer: Self-pay | Admitting: Medical-Surgical

## 2019-05-11 ENCOUNTER — Other Ambulatory Visit: Payer: Self-pay

## 2019-05-11 ENCOUNTER — Ambulatory Visit (INDEPENDENT_AMBULATORY_CARE_PROVIDER_SITE_OTHER): Payer: Medicare Other | Admitting: Medical-Surgical

## 2019-05-11 VITALS — BP 91/56 | HR 57 | Temp 98.2°F | Ht 67.0 in | Wt 136.2 lb

## 2019-05-11 DIAGNOSIS — I10 Essential (primary) hypertension: Secondary | ICD-10-CM | POA: Diagnosis not present

## 2019-05-11 DIAGNOSIS — D631 Anemia in chronic kidney disease: Secondary | ICD-10-CM | POA: Diagnosis not present

## 2019-05-11 DIAGNOSIS — N186 End stage renal disease: Secondary | ICD-10-CM

## 2019-05-11 DIAGNOSIS — Z992 Dependence on renal dialysis: Secondary | ICD-10-CM

## 2019-05-11 NOTE — Progress Notes (Signed)
Subjective:    CC: Chronic disease follow-up  HPI: Very pleasant 45 year old female presenting today for follow-up on chronic diseases.  Doing very well today.  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  Anemia of chronic disease-has been able to connect with hematology/oncology since her last visit.  Is currently being treated with darbepoetin 300 mcg every 2 weeks.  Last hemoglobin 8.4.  This will be monitored by renal/hematology.  Fatigue much improved.  End-stage renal disease on peritoneal dialysis-was able to connect with Dr. Ernesto [facility] [place].  Has completed paperwork to transfer her dialysis care to a center in either [place] or [place].  Essential hypertension-currently well managed without medication, borderline hypotension.  Denies chest pain, palpitations, headaches, dizziness, and lower extremity edema.  Endorses some weakness in the mornings that she feels is related to her blood pressure being low, resolved by increased fluid intake.  Increased flatulence/stomach cramping-completed a colonoscopy with a EGD last Thursday and reports she has noticed increased flatulence since then.  Also reports stomach cramps when she feels that she needs to pass gas or have a bowel movement.  No nausea, vomiting, diarrhea, or bloody stools.  I reviewed the past medical history, family history, social history, surgical history, and allergies today and no changes were needed.  Please see the problem list section below in epic for further details.  Past Medical History: Past Medical History:  Diagnosis Date  . Anemia   . Chronic kidney disease 02/22/2018   ESRD-dialysis, training to do dialysis at home. Currently does dialysis every day.   . CKD (chronic kidney disease), stage V ([place]) 02/23/2018  . Dialysis patient ([place])   . Diarrhea due to drug 07/24/2018  . GERD (gastroesophageal reflux disease)   . Hemolacria    . History of gestational diabetes 06/29/2012  . Hypertension    Past Surgical History: Past Surgical History:  Procedure Laterality Date  . AV FISTULA PLACEMENT Right 03/02/2018   Procedure: ARTERIOVENOUS (AV) FISTULA CREATION;  Surgeon: Waynetta Sandy, MD;  Location: [facility];  Service: Vascular;  Laterality: Right;  . ENDOVENOUS ABLATION SAPHENOUS VEIN W/ LASER    . HYSTEROSCOPY WITH D & C N/A 09/13/2018   Procedure: DILATATION AND CURETTAGE WITH MINERVA ABLATION;  Surgeon: Emily Filbert, MD;  Location: [facility];  Service: Gynecology;  Laterality: N/A;  Rep will be here confirmed on 09/06/18 CS  . IR FLUORO GUIDE CV LINE RIGHT  02/24/2018  . IR US GUIDE VASC ACCESS RIGHT  02/24/2018  . LAPAROSCOPY    . TUBAL LIGATION     Social History: Social History   Socioeconomic History  . Marital status: Married    Spouse name: Not on file  . Number of children: 3  . Years of education: Not on file  . Highest education level: Not on file  Occupational History  . Occupation: homemaker  Tobacco Use  . Smoking status: Never Smoker  . Smokeless tobacco: Never Used  Substance and Sexual Activity  . Alcohol use: Not Currently  . Drug use: Never  . Sexual activity: Yes    Birth control/protection: Surgical, None  Other Topics Concern  . Not on file  Social History Narrative  . Not on file   Social Determinants of Health   Financial Resource Strain:   . Difficulty of Paying Living Expenses:   Food Insecurity:   . Worried About Charity fundraiser in the Last  Year:   . Ran Out of Food in the Last Year:   Transportation Needs:   . Film/video editor (Medical):   Marland Kitchen Lack of Transportation (Non-Medical):   Physical Activity:   . Days of Exercise per Week:   . Minutes of Exercise per Session:   Stress:   . Feeling of Stress :   Social Connections:   . Frequency of Communication with Friends and Family:   . Frequency of Social Gatherings with Friends and Family:   . Attends  Religious Services:   . Active Member of Clubs or Organizations:   . Attends Archivist Meetings:   Marland Kitchen Marital Status:    Family History: Family History  Problem Relation Age of Onset  . Diabetes Father   . Hypertension Father   . Diabetes Mother   . Hypertension Mother   . Colon cancer Sister 68  . Wilm's tumor Sister   . Colon polyps Neg Hx   . Esophageal cancer Neg Hx   . Rectal cancer Neg Hx   . Stomach cancer Neg Hx    Allergies: Allergies  Allergen Reactions  . Camellia Other (See Comments)    Irregular menses  . Lisinopril Other (See Comments)    irregular menses   Medications: See med rec.  Review of Systems: No fevers, chills, night sweats, weight loss, chest pain, or shortness of breath.   Objective:    General: Well Developed, well nourished, and in no acute distress.  Neuro: Alert and oriented x3.  HEENT: Normocephalic, atraumatic.  Skin: Warm and dry. Cardiac: Regular rate and rhythm, no murmurs rubs or gallops, no lower extremity edema.  Respiratory: Clear to auscultation bilaterally. Not using accessory muscles, speaking in full sentences.   Impression and Recommendations:     1. Essential hypertension Stable without medication at this time.  Monitor blood pressures at home.  Stay well-hydrated.  May need to discussed PD prescription with nephrology if blood pressure remains low and she becomes symptomatic.  2. Anemia in ESRD (end-stage renal disease) ([place]) Stable.  Further management by hematology/renal.  3. ESRD (end stage renal disease) on dialysis [facility]) Continue current peritoneal dialysis prescription per nephrology.  4. Increased flatulence/abdominal cramping Given proximity to colonoscopy, this may be an after effect of the procedure.  Avoid foods causing excess gas.  Patient plans to call GI to discuss this with them.  Return in about 3 months (around 08/10/2019) for  hypertension.  ___________________________________________ Clearnce Sorrel, DNP, APRN, FNP-BC Primary Care and Sports Medicine [facility]

## 2019-05-16 ENCOUNTER — Encounter: Payer: Self-pay | Admitting: Gastroenterology

## 2019-05-17 ENCOUNTER — Emergency Department (INDEPENDENT_AMBULATORY_CARE_PROVIDER_SITE_OTHER)
Admission: EM | Admit: 2019-05-17 | Discharge: 2019-05-17 | Disposition: A | Discharge status: Home / Self Care | Payer: Medicare Other | Source: Home / Self Care | Attending: Family Medicine | Admitting: Family Medicine

## 2019-05-17 ENCOUNTER — Other Ambulatory Visit: Payer: Self-pay

## 2019-05-17 ENCOUNTER — Other Ambulatory Visit: Payer: Self-pay | Admitting: Hematology

## 2019-05-17 ENCOUNTER — Inpatient Hospital Stay: Payer: Medicare Other

## 2019-05-17 VITALS — BP 108/67 | HR 60 | Temp 98.3°F | Resp 19

## 2019-05-17 DIAGNOSIS — D631 Anemia in chronic kidney disease: Secondary | ICD-10-CM

## 2019-05-17 DIAGNOSIS — Z0189 Encounter for other specified special examinations: Secondary | ICD-10-CM

## 2019-05-17 DIAGNOSIS — Z20822 Contact with and (suspected) exposure to covid-19: Secondary | ICD-10-CM

## 2019-05-17 DIAGNOSIS — N186 End stage renal disease: Secondary | ICD-10-CM

## 2019-05-17 LAB — CBC WITH DIFFERENTIAL (CANCER CENTER ONLY)
Abs Immature Granulocytes: 0.03 10*3/uL (ref 0.00–0.07)
Basophils Absolute: 0 10*3/uL (ref 0.0–0.1)
Basophils Relative: 0 %
Eosinophils Absolute: 0.3 10*3/uL (ref 0.0–0.5)
Eosinophils Relative: 4 %
HCT: 26 % — ABNORMAL LOW (ref 36.0–46.0)
Hemoglobin: 8.5 g/dL — ABNORMAL LOW (ref 12.0–15.0)
Immature Granulocytes: 0 %
Lymphocytes Relative: 17 %
Lymphs Abs: 1.3 10*3/uL (ref 0.7–4.0)
MCH: 32.7 pg (ref 26.0–34.0)
MCHC: 32.7 g/dL (ref 30.0–36.0)
MCV: 100 fL (ref 80.0–100.0)
Monocytes Absolute: 0.5 10*3/uL (ref 0.1–1.0)
Monocytes Relative: 6 %
Neutro Abs: 5.5 10*3/uL (ref 1.7–7.7)
Neutrophils Relative %: 73 %
Platelet Count: 226 10*3/uL (ref 150–400)
RBC: 2.6 MIL/uL — ABNORMAL LOW (ref 3.87–5.11)
RDW: 15.1 % (ref 11.5–15.5)
WBC Count: 7.7 10*3/uL (ref 4.0–10.5)
nRBC: 0 % (ref 0.0–0.2)

## 2019-05-17 MED ORDER — DARBEPOETIN ALFA 300 MCG/0.6ML IJ SOSY
300.0000 ug | PREFILLED_SYRINGE | Freq: Once | INTRAMUSCULAR | Status: AC
  Administered 2019-05-17: 300 ug via SUBCUTANEOUS

## 2019-05-17 MED ORDER — DARBEPOETIN ALFA 300 MCG/0.6ML IJ SOSY
PREFILLED_SYRINGE | INTRAMUSCULAR | Status: AC
  Filled 2019-05-17: qty 0.6

## 2019-05-17 NOTE — ED Provider Notes (Signed)
[facility]    CSN: [id number] Arrival date & time: 05/17/19  1047      History   Chief Complaint Chief Complaint  Patient presents with  . COVID Testing    HPI Sara Meza is a 44 y.o. female.   Patient presents for COVID19 testing prior to beginning dialysis at a new clinic.  She is completely assymptomatic at present.  She denies known exposure to [place].  The history is provided by the patient.    Past Medical History:  Diagnosis Date  . Anemia   . Chronic kidney disease 02/22/2018   ESRD-dialysis, training to do dialysis at home. Currently does dialysis every day.   . CKD (chronic kidney disease), stage V ([place]) 02/23/2018  . Dialysis patient ([place])   . Diarrhea due to drug 07/24/2018  . GERD (gastroesophageal reflux disease)   . Hemolacria   . History of gestational diabetes 06/29/2012  . Hypertension     Patient Active Problem List   Diagnosis Date Noted  . Early satiety 07/24/2018  . Dyspepsia 07/24/2018  . Vitamin D deficiency 03/08/2018  . ESRD (end stage renal disease) on dialysis ([place]) 03/08/2018  . Anemia of chronic disease 03/08/2018  . Secondary hyperparathyroidism of renal origin ([place]) 03/08/2018  . Family history of colon cancer requiring screening colonoscopy 02/22/2018  . Rash and nonspecific skin eruption 02/22/2018  . History of varicose veins of lower extremity 02/22/2018  . Peripheral edema 02/22/2018  . Iron deficiency anemia due to chronic blood loss 02/22/2018  . Anemia in ESRD (end-stage renal disease) ([place]) 02/22/2018  . Menorrhagia with regular cycle 02/22/2018  . Hypertension goal BP (blood pressure) < 130/80 02/21/2018  . Iron deficiency 02/21/2018  . Status post tubal ligation 02/21/2018  . Hypertensive retinopathy 02/21/2018  . Essential hypertension 12/20/2014  . Left ankle pain 08/25/2012  . History of gestational diabetes 06/29/2012  . 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 Bilateral plantar fasciitis 06/29/2012    . Restless legs 06/29/2012    Past Surgical History:  Procedure Laterality Date  . AV FISTULA PLACEMENT Right 03/02/2018   Procedure: ARTERIOVENOUS (AV) FISTULA CREATION;  Surgeon: Waynetta Sandy, MD;  Location: [facility];  Service: Vascular;  Laterality: Right;  . ENDOVENOUS ABLATION SAPHENOUS VEIN W/ LASER    . HYSTEROSCOPY WITH D & C N/A 09/13/2018   Procedure: DILATATION AND CURETTAGE WITH MINERVA ABLATION;  Surgeon: Emily Filbert, MD;  Location: [facility];  Service: Gynecology;  Laterality: N/A;  Rep will be here confirmed on 09/06/18 CS  . IR FLUORO GUIDE CV LINE RIGHT  02/24/2018  . IR US GUIDE VASC ACCESS RIGHT  02/24/2018  . LAPAROSCOPY    . TUBAL LIGATION      OB History    Gravida  3   Para  3   Term      Preterm      AB      Living        SAB      TAB      Ectopic      Multiple      Live Births               Home Medications    Prior to Admission medications   Medication Sig Start Date End Date Taking? Authorizing Provider  albuterol (VENTOLIN HFA) 108 (90 Base) MCG/ACT inhaler Inhale 2 puffs into the lungs every 6 (six) hours as needed for wheezing. 04/10/19   Samuel Bouche, NP  calcitRIOL (  ROCALTROL) 0.5 MCG capsule Take 0.5 mcg by mouth daily.  09/18/18   [provider]  Cholecalciferol 1.25 MG (50000 UT) TABS Take 1 tablet by mouth once a week. 04/11/19   Samuel Bouche, NP  cinacalcet (SENSIPAR) 60 MG tablet Take 60 mg by mouth daily.     [provider]  Darbepoetin Alfa (ARANESP) 300 MCG/0.6ML SOSY injection Inject 300 mcg into the skin every 14 (fourteen) days.    [provider]  ferric citrate (AURYXIA) 1 GM 210 MG(Fe) tablet Take 3 tablets (630 mg total) by mouth 3 (three) times daily with meals. Patient taking differently: Take 630 mg by mouth 3 (three) times daily with meals. 2 tablets with a snack 03/03/18   Lavina Hamman, MD  Melatonin-Pyridoxine (MELATIN PO) Take by mouth at bedtime as needed for sleep. 01/18/19    [provider]  ondansetron (ZOFRAN ODT) 4 MG disintegrating tablet Take 1 tablet (4 mg total) by mouth every 8 (eight) hours as needed for nausea or vomiting. 04/17/19   Jackquline Denmark, MD  pantoprazole (PROTONIX) 40 MG tablet TAKE 1 TABLET BY MOUTH DAILY BEFORE BREAKFAST. Patient taking differently: Take 40 mg by mouth daily.  09/11/18   [name], PA-C    Family History Family History  Problem Relation Age of Onset  . Diabetes Father   . Hypertension Father   . Diabetes Mother   . Hypertension Mother   . Colon cancer Sister 2  . Wilm's tumor Sister   . Colon polyps Neg Hx   . Esophageal cancer Neg Hx   . Rectal cancer Neg Hx   . Stomach cancer Neg Hx     Social History Social History   Tobacco Use  . Smoking status: Never Smoker  . Smokeless tobacco: Never Used  Substance Use Topics  . Alcohol use: Not Currently  . Drug use: Never     Allergies   Camellia and Lisinopril   Review of Systems Review of Systems No sore throat No changes in taste/smell No cough No pleuritic pain No wheezing No nasal congestion No post-nasal drainage No sinus pain/pressure No itchy/red eyes No earache No hemoptysis No SOB No fever/chills No nausea No vomiting No abdominal pain No diarrhea No urinary symptoms No skin rash No fatigue No myalgias No headache   Physical Exam Triage Vital Signs ED Triage Vitals  Enc Vitals Group     BP 05/17/19 1057 120/76     Pulse Rate 05/17/19 1057 78     Resp 05/17/19 1057 16     Temp 05/17/19 1057 98.7 F (37.1 C)     Temp Source 05/17/19 1057 Oral     SpO2 05/17/19 1057 100 %     Weight --      Height --      Head Circumference --      Peak Flow --      Pain Score 05/17/19 1056 0     Pain Loc --      Pain Edu? --      Excl. in [place]? --    No data found.  Updated Vital Signs BP 120/76 (BP Location: Left Arm)   Pulse 78   Temp 98.7 F (37.1 C) (Oral)   Resp 16   SpO2 100%   Visual  Acuity Right Eye Distance:   Left Eye Distance:   Bilateral Distance:    Right Eye Near:   Left Eye Near:    Bilateral Near:  Physical Exam Vitals and nursing note reviewed.  Constitutional:      General: She is not in acute distress.    Appearance: She is not ill-appearing.  Neurological:     Mental Status: She is alert.   Patient not examined otherwise.   UC Treatments / Results  Labs (all labs ordered are listed, but only abnormal results are displayed) Labs Reviewed  NOVEL CORONAVIRUS, NAA    EKG   Radiology No results found.  Procedures Procedures (including critical care time)  Medications Ordered in UC Medications - No data to display  Initial Impression / Assessment and Plan / UC Course  I have reviewed the triage vital signs and the nursing notes.  Pertinent labs & imaging results that were available during my care of the patient were reviewed by me and considered in my medical decision making (see chart for details).    Patient assymptomatic at present. COVID19 send out   Final Clinical Impressions(s) / UC Diagnoses   Final diagnoses:  Routine lab draw  Encounter for laboratory testing for COVID-19 virus     Discharge Instructions     If your COVID19 test is positive, then you are infected with the novel coronavirus and could give the virus to others.  Please continue isolation at home for at least 10 days since the start of your symptoms. If you do not have symptoms, please isolate at home for 10 days from the day you were tested. Once you complete your 10 day quarantine, you may return to normal activities as long as you've not had a fever for over 24 hours (without taking fever reducing medicine) and your symptoms are improving. Please continue good preventive care measures, including:  frequent hand-washing, avoid touching your face, cover coughs/sneezes, stay out of crowds and keep a 6 foot distance from others.  Go to the nearest hospital  emergency room if fever/cough/breathlessness are severe or illness seems like a threat to life.    ED Prescriptions    None        Kandra Nicolas, MD 05/17/19 1120

## 2019-05-17 NOTE — Patient Instructions (Signed)
Darbepoetin Alfa injection What is this medicine? DARBEPOETIN ALFA (dar be POE e tin AL fa) helps your body make more red blood cells. It is used to treat anemia caused by chronic kidney failure and chemotherapy. This medicine may be used for other purposes; ask your health care provider or pharmacist if you have questions. COMMON BRAND NAME(S): Aranesp What should I tell my health care provider before I take this medicine? They need to know if you have any of these conditions:  blood clotting disorders or history of blood clots  cancer patient not on chemotherapy  cystic fibrosis  heart disease, such as angina, heart failure, or a history of a heart attack  hemoglobin level of 12 g/dL or greater  high blood pressure  low levels of folate, iron, or vitamin B12  seizures  an unusual or allergic reaction to darbepoetin, erythropoietin, albumin, hamster proteins, latex, other medicines, foods, dyes, or preservatives  pregnant or trying to get pregnant  breast-feeding How should I use this medicine? This medicine is for injection into a vein or under the skin. It is usually given by a health care professional in a hospital or clinic setting. If you get this medicine at home, you will be taught how to prepare and give this medicine. Use exactly as directed. Take your medicine at regular intervals. Do not take your medicine more often than directed. It is important that you put your used needles and syringes in a special sharps container. Do not put them in a trash can. If you do not have a sharps container, call your pharmacist or healthcare provider to get one. A special MedGuide will be given to you by the pharmacist with each prescription and refill. Be sure to read this information carefully each time. Talk to your pediatrician regarding the use of this medicine in children. While this medicine may be used in children as young as 1 month of age for selected conditions, precautions do  apply. Overdosage: If you think you have taken too much of this medicine contact a poison control center or emergency room at once. NOTE: This medicine is only for you. Do not share this medicine with others. What if I miss a dose? If you miss a dose, take it as soon as you can. If it is almost time for your next dose, take only that dose. Do not take double or extra doses. What may interact with this medicine? Do not take this medicine with any of the following medications:  epoetin alfa This list may not describe all possible interactions. Give your health care provider a list of all the medicines, herbs, non-prescription drugs, or dietary supplements you use. Also tell them if you smoke, drink alcohol, or use illegal drugs. Some items may interact with your medicine. What should I watch for while using this medicine? Your condition will be monitored carefully while you are receiving this medicine. You may need blood work done while you are taking this medicine. This medicine may cause a decrease in vitamin B6. You should make sure that you get enough vitamin B6 while you are taking this medicine. Discuss the foods you eat and the vitamins you take with your health care professional. What side effects may I notice from receiving this medicine? Side effects that you should report to your doctor or health care professional as soon as possible:  allergic reactions like skin rash, itching or hives, swelling of the face, lips, or tongue  breathing problems  changes in   vision  chest pain  confusion, trouble speaking or understanding  feeling faint or lightheaded, falls  high blood pressure  muscle aches or pains  pain, swelling, warmth in the leg  rapid weight gain  severe headaches  sudden numbness or weakness of the face, arm or leg  trouble walking, dizziness, loss of balance or coordination  seizures (convulsions)  swelling of the ankles, feet, hands  unusually weak or  tired Side effects that usually do not require medical attention (report to your doctor or health care professional if they continue or are bothersome):  diarrhea  fever, chills (flu-like symptoms)  headaches  nausea, vomiting  redness, stinging, or swelling at site where injected This list may not describe all possible side effects. Call your doctor for medical advice about side effects. You may report side effects to FDA at 1-800-FDA-1088. Where should I keep my medicine? Keep out of the reach of children. Store in a refrigerator between 2 and 8 degrees C (36 and 46 degrees F). Do not freeze. Do not shake. Throw away any unused portion if using a single-dose vial. Throw away any unused medicine after the expiration date. NOTE: This sheet is a summary. It may not cover all possible information. If you have questions about this medicine, talk to your doctor, pharmacist, or health care provider.  2020 Elsevier/Gold Standard (2017-01-19 16:44:20)  

## 2019-05-17 NOTE — Discharge Instructions (Addendum)
If your COVID19 test is positive, then you are infected with the novel coronavirus and could give the virus to others.  Please continue isolation at home for at least 10 days since the start of your symptoms. If you do not have symptoms, please isolate at home for 10 days from the day you were tested. Once you complete your 10 day quarantine, you may return to normal activities as long as you've not had a fever for over 24 hours (without taking fever reducing medicine) and your symptoms are improving. Please continue good preventive care measures, including:  frequent hand-washing, avoid touching your face, cover coughs/sneezes, stay out of crowds and keep a 6 foot distance from others.  Go to the nearest hospital emergency room if fever/cough/breathlessness are severe or illness seems like a threat to life.

## 2019-05-17 NOTE — ED Triage Notes (Signed)
Patient presents to Urgent Care with complaints of needing a COVID test since she will be starting to receive dialysis at a new clinic and needs testing before she can start. Patient reports she has no signs or symptoms or exposures to COVID.

## 2019-05-18 LAB — NOVEL CORONAVIRUS, NAA: SARS-CoV-2, NAA: NOT DETECTED

## 2019-05-18 LAB — SARS-COV-2, NAA 2 DAY TAT

## 2019-05-28 DIAGNOSIS — N186 End stage renal disease: Secondary | ICD-10-CM

## 2019-05-28 DIAGNOSIS — G4700 Insomnia, unspecified: Secondary | ICD-10-CM | POA: Insufficient documentation

## 2019-05-28 DIAGNOSIS — G629 Polyneuropathy, unspecified: Secondary | ICD-10-CM | POA: Insufficient documentation

## 2019-05-28 DIAGNOSIS — Z992 Dependence on renal dialysis: Secondary | ICD-10-CM

## 2019-05-28 HISTORY — DX: End stage renal disease: Z99.2

## 2019-05-28 HISTORY — DX: End stage renal disease: N18.6

## 2019-05-31 ENCOUNTER — Inpatient Hospital Stay: Payer: Medicare Other | Admitting: Hematology

## 2019-05-31 ENCOUNTER — Inpatient Hospital Stay: Payer: Medicare Other | Attending: Hematology

## 2019-05-31 ENCOUNTER — Inpatient Hospital Stay: Payer: Medicare Other

## 2019-07-04 ENCOUNTER — Encounter: Payer: Self-pay | Admitting: Medical-Surgical

## 2019-07-04 ENCOUNTER — Ambulatory Visit (INDEPENDENT_AMBULATORY_CARE_PROVIDER_SITE_OTHER): Payer: Medicare Other | Admitting: Medical-Surgical

## 2019-07-04 ENCOUNTER — Other Ambulatory Visit: Payer: Self-pay

## 2019-07-04 VITALS — BP 99/64 | HR 98 | Temp 98.4°F | Ht 67.0 in | Wt 141.1 lb

## 2019-07-04 DIAGNOSIS — G4701 Insomnia due to medical condition: Secondary | ICD-10-CM

## 2019-07-04 DIAGNOSIS — G63 Polyneuropathy in diseases classified elsewhere: Secondary | ICD-10-CM

## 2019-07-04 DIAGNOSIS — Z992 Dependence on renal dialysis: Secondary | ICD-10-CM

## 2019-07-04 DIAGNOSIS — M222X1 Patellofemoral disorders, right knee: Secondary | ICD-10-CM | POA: Diagnosis not present

## 2019-07-04 DIAGNOSIS — N186 End stage renal disease: Secondary | ICD-10-CM

## 2019-07-04 DIAGNOSIS — M222X2 Patellofemoral disorders, left knee: Secondary | ICD-10-CM

## 2019-07-04 MED ORDER — TEMAZEPAM 7.5 MG PO CAPS
7.5000 mg | ORAL_CAPSULE | Freq: Every evening | ORAL | 0 refills | Status: DC | PRN
Start: 2019-07-04 — End: 2019-09-28

## 2019-07-04 NOTE — Progress Notes (Signed)
Subjective:    CC: insomnia, lower extremity pain, discuss recent labs  HPI: Sara 44 year old Meza presenting today for several concerns.  ESRD-would like to discuss recent labs that were drawn by nephrology.  She reports her calcium has been high and she is unsure why it will not come down.  She is having labs drawn approximately every 2 weeks with nephrology.  Her hemoglobin is stable but her PTH is greater than 600.  Was previously taking calcitriol 0.5 mcg daily but this was stopped due to her elevated calcium.  She has tried taking Sensipar but is unable to tolerate even small doses of this due to vomiting.  She is limiting high calcium foods.  Compliant with peritoneal dialysis treatments.  Reports she is on the transplant list.  Admits that she is struggling with the limitations of her endurance and tolerance since her kidneys failed.  She wants to feel normal again and be able to work and be productive like she used to.  She is not doing counseling and at this time is not open to me placing referral.  Denies SI/HI.  Lower extremity pain-history of patella-femoral syndrome.  Reports she had inserts made for her shoes 4 to 5 years ago by Dr. Darene Lamer and these were very helpful in managing her lower extremity pain.  Currently she is reporting her knees hurt bilaterally with the left worse than the right.  Had a cortisone shot in her knee 4 to 5 years ago which provided her significant relief.  Is considering having that repeated with Dr. Darene Lamer if orthotics do not work for her.  Her ankles have also started causing her discomfort.  She experiences numbness, tingling, and burning.  Saw neurology 1 week ago where they started her on nortriptyline 10 mg nightly with instructions to increase to 20 mg nightly after a 1 week.  She is currently taking the 10 mg, tolerating well without side effects.  She notes a very minor improvement in her symptoms.  Insomnia-having significant difficulty falling asleep as  well as staying asleep.  Reports this has been going on for months.  She has tried melatonin which worked for a little while but is no longer effective.  She is also recently tried trazodone which did not have any benefit for her.  Notes that she has to be able to awaken for her peritoneal dialysis machine alarms.  Unfortunately her significant leg pain/burning/tingling wakes her up at night.  She has to get up and walk around to help reduce the pain in her legs which then makes it hard to go back to sleep.  Taking nortriptyline at night but this is not helping her sleep.  I reviewed the past medical history, family history, social history, surgical history, and allergies today and no changes were needed.  Please see the problem list section below in epic for further details.  Past Medical History: Past Medical History:  Diagnosis Date  . Anemia   . Chronic kidney disease 02/22/2018   ESRD-dialysis, training to do dialysis at home. Currently does dialysis every day.   . CKD (chronic kidney disease), stage V ([place]) 02/23/2018  . Dialysis patient ([place])   . Diarrhea due to drug 07/24/2018  . GERD (gastroesophageal reflux disease)   . Hemolacria   . History of gestational diabetes 06/29/2012  . Hypertension    Past Surgical History: Past Surgical History:  Procedure Laterality Date  . AV FISTULA PLACEMENT Right 03/02/2018   Procedure: ARTERIOVENOUS (AV) FISTULA CREATION;  Surgeon: Waynetta Sandy, MD;  Location: [place];  Service: Vascular;  Laterality: Right;  . ENDOVENOUS ABLATION SAPHENOUS VEIN W/ LASER    . HYSTEROSCOPY WITH D & C N/A 09/13/2018   Procedure: DILATATION AND CURETTAGE WITH MINERVA ABLATION;  Surgeon: Emily Filbert, MD;  Location: [place];  Service: Gynecology;  Laterality: N/A;  Rep will be here confirmed on 09/06/18 CS  . IR FLUORO GUIDE CV LINE RIGHT  02/24/2018  . IR US GUIDE VASC ACCESS RIGHT  02/24/2018  . LAPAROSCOPY    . TUBAL LIGATION     Social History: Social History    Socioeconomic History  . Marital status: Married    Spouse name: Not on file  . Number of children: 3  . Years of education: Not on file  . Highest education level: Not on file  Occupational History  . Occupation: homemaker  Tobacco Use  . Smoking status: Never Smoker  . Smokeless tobacco: Never Used  Vaping Use  . Vaping Use: Never used  Substance and Sexual Activity  . Alcohol use: Not Currently  . Drug use: Never  . Sexual activity: Yes    Birth control/protection: Surgical, None  Other Topics Concern  . Not on file  Social History Narrative  . Not on file   Social Determinants of Health   Financial Resource Strain:   . Difficulty of Paying Living Expenses:   Food Insecurity:   . Worried About Charity fundraiser in the Last Year:   . Arboriculturist in the Last Year:   Transportation Needs:   . Film/video editor (Medical):   Marland Kitchen Lack of Transportation (Non-Medical):   Physical Activity:   . Days of Exercise per Week:   . Minutes of Exercise per Session:   Stress:   . Feeling of Stress :   Social Connections:   . Frequency of Communication with Friends and Family:   . Frequency of Social Gatherings with Friends and Family:   . Attends Religious Services:   . Active Member of Clubs or Organizations:   . Attends Archivist Meetings:   Marland Kitchen Marital Status:    Family History: Family History  Problem Relation Age of Onset  . Diabetes Father   . Hypertension Father   . Diabetes Mother   . Hypertension Mother   . Colon cancer Sister 75  . Wilm's tumor Sister   . Colon polyps Neg Hx   . Esophageal cancer Neg Hx   . Rectal cancer Neg Hx   . Stomach cancer Neg Hx    Allergies: Allergies  Allergen Reactions  . Camellia Other (See Comments)    Irregular menses  . Lisinopril Other (See Comments)    irregular menses   Medications: See med rec.  Review of Systems: No fevers, chills, night sweats, weight loss, chest pain, or shortness of breath.    Objective:    General: Well Developed, well nourished, and in no acute distress.  Neuro: Alert and oriented x3.  HEENT: Normocephalic, atraumatic.  Skin: Warm and dry. Cardiac: Regular rate and rhythm, no murmurs rubs or gallops, no lower extremity edema.  Respiratory: Clear to auscultation bilaterally. Not using accessory muscles, speaking in full sentences.  Impression and Recommendations:    1. Patellofemoral pain syndrome of both knees Unfortunately Dr. Zenda Alpers does not make orthotics anymore.  Referral provided to sports medicine in [facility] to see Dr. Clearance Coots to have this made.  May consider scheduling  a return appointment with Dr. Darene Lamer if these are ineffective and she would like to have cortisone shots in her knees. - Ambulatory referral to Sports Medicine  2. ESRD (end stage renal disease) on dialysis [facility]) Managed by nephrology.  Reviewed labs and discussed etiology of elevated PTH/calcium in the setting of renal failure.  Unfortunately she is unable to take Sensipar which could greatly benefit her.  Calcitriol was stopped due to elevated calcium so this is also not an option.  Discussed limiting dietary calcium.  Advised the patient that nephrology will need to continue managing her electrolytes as this directs her peritoneal dialysis prescriptions.  If she changes her mind and would like me to refer her to behavioral health for counseling, I will be glad to do that.  3. Insomnia due to medical condition She has failed melatonin and trazodone.  Nortriptyline not controlling symptoms and is not helping her sleep.  We will try temazepam 7.5 mg nightly as needed for sleep.  Advised patient to try 7.5 mg nightly for 1 week and if this is not effective she may increase to 15 mg nightly to see if this helps.  4. Polyneuropathy associated with underlying disease ([place]) Discussed neuropathy in relationship to renal failure.  She is currently taking the nortriptyline at 10 mg  and will increase to 20 next week.  She may see more benefit at 20 mg.  Advised that she may need to contact the neurologist if she does not have relief at 20 mg.  Reassurance provided that sometimes these medications take a while to find the right dose and that patience is important.  Return in about 3 months (around 10/04/2019) for Chronic disease follow-up. ___________________________________________ Clearnce Sorrel, DNP, APRN, FNP-BC [facility]

## 2019-07-06 ENCOUNTER — Encounter: Payer: Self-pay | Admitting: Medical-Surgical

## 2019-07-06 ENCOUNTER — Telehealth: Payer: Self-pay

## 2019-07-06 NOTE — Telephone Encounter (Signed)
Pt states she started the temazepam last night and woke up this morning with a rash. Pt would like to know if there is something else she can take to help her sleep as well as if there is anything she needs to do regarding the rash. I instructed pt not to take any more of the Rx until she heard back from [place].   HA: No Dizziness/lightheadedness: No Fever: No BA: No Weakness/Fatigue: No  Sinus pain/pressure: No  Runny nose: No  ST: No  ShOB: No  CP: No  Palps: No Abd pain: No Dysuria: No  N/V/C/D: No

## 2019-07-06 NOTE — Telephone Encounter (Signed)
Send patient MyChart message 07/06/2019 at 6:23 PM.  Recommend stopping the medication and will look into alternatives to see if we can find something to help her sleep better at night that is appropriate for renal failure patients.

## 2019-07-09 ENCOUNTER — Encounter: Payer: Self-pay | Admitting: Family Medicine

## 2019-07-09 ENCOUNTER — Other Ambulatory Visit: Payer: Self-pay

## 2019-07-09 ENCOUNTER — Ambulatory Visit (INDEPENDENT_AMBULATORY_CARE_PROVIDER_SITE_OTHER): Payer: Medicare Other | Admitting: Family Medicine

## 2019-07-09 DIAGNOSIS — M222X2 Patellofemoral disorders, left knee: Secondary | ICD-10-CM

## 2019-07-09 DIAGNOSIS — M222X1 Patellofemoral disorders, right knee: Secondary | ICD-10-CM | POA: Diagnosis present

## 2019-07-09 NOTE — Progress Notes (Signed)
Sara Meza - 44 y.o. female MRN [id number]  Date of birth: 1975/08/21  SUBJECTIVE:  Including CC & ROS.  Chief Complaint  Patient presents with  . Foot Orthotics    Sara Meza is a 44 y.o. female that is presenting with bilateral knee pain.  She has received orthotics in the past that seem to improve her bilateral knee pain.  It seems to be worse the more that she walks.   Review of Systems See HPI   HISTORY: Past Medical, Surgical, Social, and Family History Reviewed & Updated per EMR.   Pertinent Historical Findings include:  Past Medical History:  Diagnosis Date  . Anemia   . Chronic kidney disease 02/22/2018   ESRD-dialysis, training to do dialysis at home. Currently does dialysis every day.   . CKD (chronic kidney disease), stage V ([place]) 02/23/2018  . Dialysis patient ([place])   . Diarrhea due to drug 07/24/2018  . GERD (gastroesophageal reflux disease)   . Hemolacria   . History of gestational diabetes 06/29/2012  . Hypertension     Past Surgical History:  Procedure Laterality Date  . AV FISTULA PLACEMENT Right 03/02/2018   Procedure: ARTERIOVENOUS (AV) FISTULA CREATION;  Surgeon: Waynetta Sandy, MD;  Location: [place];  Service: Vascular;  Laterality: Right;  . ENDOVENOUS ABLATION SAPHENOUS VEIN W/ LASER    . HYSTEROSCOPY WITH D & C N/A 09/13/2018   Procedure: DILATATION AND CURETTAGE WITH MINERVA ABLATION;  Surgeon: Emily Filbert, MD;  Location: [place];  Service: Gynecology;  Laterality: N/A;  Rep will be here confirmed on 09/06/18 CS  . IR FLUORO GUIDE CV LINE RIGHT  02/24/2018  . IR US GUIDE VASC ACCESS RIGHT  02/24/2018  . LAPAROSCOPY    . TUBAL LIGATION      Family History  Problem Relation Age of Onset  . Diabetes Father   . Hypertension Father   . Diabetes Mother   . Hypertension Mother   . Colon cancer Sister 8  . Wilm's tumor Sister   . Colon polyps Neg Hx   . Esophageal cancer Neg Hx   . Rectal cancer Neg Hx   . Stomach cancer Neg Hx      Social History   Socioeconomic History  . Marital status: Married    Spouse name: Not on file  . Number of children: 3  . Years of education: Not on file  . Highest education level: Not on file  Occupational History  . Occupation: homemaker  Tobacco Use  . Smoking status: Never Smoker  . Smokeless tobacco: Never Used  Vaping Use  . Vaping Use: Never used  Substance and Sexual Activity  . Alcohol use: Not Currently  . Drug use: Never  . Sexual activity: Yes    Birth control/protection: Surgical, None  Other Topics Concern  . Not on file  Social History Narrative  . Not on file   Social Determinants of Health   Financial Resource Strain:   . Difficulty of Paying Living Expenses:   Food Insecurity:   . Worried About Charity fundraiser in the Last Year:   . Arboriculturist in the Last Year:   Transportation Needs:   . Film/video editor (Medical):   Marland Kitchen Lack of Transportation (Non-Medical):   Physical Activity:   . Days of Exercise per Week:   . Minutes of Exercise per Session:   Stress:   . Feeling of Stress :   Social Connections:   . Frequency  of Communication with Friends and Family:   . Frequency of Social Gatherings with Friends and Family:   . Attends Religious Services:   . Active Member of Clubs or Organizations:   . Attends Archivist Meetings:   Marland Kitchen Marital Status:   Intimate Partner Violence:   . Fear of Current or Ex-Partner:   . Emotionally Abused:   Marland Kitchen Physically Abused:   . Sexually Abused:      PHYSICAL EXAM:  VS: BP 119/78   Pulse 96   Ht 5\' 6"  (1.676 m)   Wt 135 lb (61.2 kg)   BMI 21.79 kg/m  Physical Exam Gen: NAD, alert, cooperative with exam, well-appearing MSK:  Right and left knee: No obvious effusion. Normal strength resistance. Normal range of motion. Neurovascular intact  Patient was fitted for a standard, cushioned, semi-rigid orthotic. The orthotic was heated and afterward the patient stood on the  orthotic blank positioned on the orthotic stand. The patient was positioned in subtalar neutral position and 10 degrees of ankle dorsiflexion in a weight bearing stance. After completion of molding, a stable base was applied to the orthotic blank. The blank was ground to a stable position for weight bearing. Size: 65F Pairs: 2 Base: Blue EVA Additional Posting and Padding: None The patient ambulated these, and they were very comfortable.    ASSESSMENT & PLAN:   Patella-femoral syndrome Acute on chronic in nature. Had relief of her symptoms with previous orthotics.  -Counseled on home exercise therapy and supportive care. -Orthotics. -Could consider injections.

## 2019-07-09 NOTE — Telephone Encounter (Signed)
Provider and pt are communicating through a separate MyChart message.

## 2019-07-09 NOTE — Assessment & Plan Note (Signed)
Acute on chronic in nature. Had relief of her symptoms with previous orthotics.  -Counseled on home exercise therapy and supportive care. -Orthotics. -Could consider injections.

## 2019-08-10 ENCOUNTER — Ambulatory Visit: Payer: Medicare Other | Admitting: Medical-Surgical

## 2019-09-03 DIAGNOSIS — R112 Nausea with vomiting, unspecified: Secondary | ICD-10-CM | POA: Insufficient documentation

## 2019-09-03 DIAGNOSIS — E871 Hypo-osmolality and hyponatremia: Secondary | ICD-10-CM | POA: Insufficient documentation

## 2019-09-03 DIAGNOSIS — E878 Other disorders of electrolyte and fluid balance, not elsewhere classified: Secondary | ICD-10-CM | POA: Insufficient documentation

## 2019-09-03 DIAGNOSIS — E876 Hypokalemia: Secondary | ICD-10-CM | POA: Insufficient documentation

## 2019-09-25 DIAGNOSIS — O24419 Gestational diabetes mellitus in pregnancy, unspecified control: Secondary | ICD-10-CM

## 2019-09-25 HISTORY — DX: Gestational diabetes mellitus in pregnancy, unspecified control: O24.419

## 2019-09-28 ENCOUNTER — Emergency Department (INDEPENDENT_AMBULATORY_CARE_PROVIDER_SITE_OTHER)
Admission: EM | Admit: 2019-09-28 | Discharge: 2019-09-28 | Disposition: A | Discharge status: Home / Self Care | Payer: Medicare Other | Source: Home / Self Care

## 2019-09-28 ENCOUNTER — Other Ambulatory Visit: Payer: Self-pay

## 2019-09-28 ENCOUNTER — Encounter: Payer: Self-pay | Admitting: Emergency Medicine

## 2019-09-28 ENCOUNTER — Encounter: Payer: Self-pay | Admitting: Medical-Surgical

## 2019-09-28 ENCOUNTER — Telehealth (INDEPENDENT_AMBULATORY_CARE_PROVIDER_SITE_OTHER): Payer: Medicare Other | Admitting: Medical-Surgical

## 2019-09-28 VITALS — BP 115/78 | HR 98 | Temp 100.1°F

## 2019-09-28 DIAGNOSIS — R059 Cough, unspecified: Secondary | ICD-10-CM

## 2019-09-28 DIAGNOSIS — R112 Nausea with vomiting, unspecified: Secondary | ICD-10-CM

## 2019-09-28 DIAGNOSIS — R05 Cough: Secondary | ICD-10-CM | POA: Diagnosis not present

## 2019-09-28 DIAGNOSIS — Z992 Dependence on renal dialysis: Secondary | ICD-10-CM

## 2019-09-28 DIAGNOSIS — J069 Acute upper respiratory infection, unspecified: Secondary | ICD-10-CM | POA: Diagnosis not present

## 2019-09-28 DIAGNOSIS — R52 Pain, unspecified: Secondary | ICD-10-CM | POA: Diagnosis not present

## 2019-09-28 DIAGNOSIS — R197 Diarrhea, unspecified: Secondary | ICD-10-CM

## 2019-09-28 DIAGNOSIS — R509 Fever, unspecified: Secondary | ICD-10-CM | POA: Diagnosis not present

## 2019-09-28 DIAGNOSIS — N186 End stage renal disease: Secondary | ICD-10-CM

## 2019-09-28 LAB — POCT CBC W AUTO DIFF (K'VILLE URGENT CARE)

## 2019-09-28 NOTE — Progress Notes (Signed)
Virtual Visit via Video Note  I connected with Sara Meza on 09/28/19 at  4:00 PM EDT by a video enabled telemedicine application and verified that I am speaking with the correct person using two identifiers.   I discussed the limitations of evaluation and management by telemedicine and the availability of in person appointments. The patient expressed understanding and agreed to proceed.  Patient location: home Provider locations: office  Subjective:    CC: nausea/vomiting/diarrhea, body aches, fever, cough  HPI: Pleasant 44 year old female presenting via MyChart video visit with reports of 7 days of fever t-max 103, nausea/vomiting, diarrhea, body aches, and nonproductive cough. Fever worse at night, resolves during the day. Nausea with vomiting daily and unable to keep down solid food and most liquids. Has been able to tolerate sipping on water only over the last few days. Feels dehydrated with her lips and mouth dry and her skin feeling tight. Has had watery light brown diarrhea nearly every hour, no melena or hematochezia. Cough is dry, hacking, worse at night. She has shortness of breath during and immediately after coughing spells. Also having dizziness and lightheadedness. Has had her first COVID vaccine. ESRD patient on peritoneal dialysis. Recently admitted at [facility] for peritonitis. Doing her PD treatments ok since she returned home and notes her fluid has stayed clear. Symptoms started after visiting [facility] to attend a transplant appointment, on the way home noted feeling very sleepy and fatigued, not like herself. Has taken Tylenol for fever and body aches, moderately helpful for both.   Past medical history, Surgical history, Family history not pertinant except as noted below, Social history, Allergies, and medications have been entered into the medical record, reviewed, and corrections made.   Review of Systems: See HPI for pertinent positives and negatives.   Objective:     General: Speaking clearly in complete sentences. Strong cough in brief spells with resulting transient shortness of breath.  Alert and oriented x3.  Normal judgment. Ill-appearing but no apparent acute distress.  Impression and Recommendations:    1. Nausea vomiting and diarrhea/fever/cough/body aches Current symptoms with inability to tolerate POs very concerning for high risk of dehydration and electrolyte imbalance. History of low BP with possible dehydration, so recommending in-person evaluation at Urgent Care or the nearest ED. Recommend COVID testing. Patient verbalized understanding and plans to go to Urgent Care. Advised patient to avoid driving due to dizziness. She voiced plan to wait for her husband to get home at 5pm and then will have him take her.  Return if symptoms worsen or fail to improve.  15 minutes of non-face-to-face time was provided during this encounter.  I discussed the assessment and treatment plan with the patient. The patient was provided an opportunity to ask questions and all were answered. The patient agreed with the plan and demonstrated an understanding of the instructions.   The patient was advised to call back or seek an in-person evaluation if the symptoms worsen or if the condition fails to improve as anticipated.   Clearnce Sorrel, DNP, APRN, FNP-BC [facility]

## 2019-09-28 NOTE — ED Provider Notes (Signed)
[facility]    CSN: [id number] Arrival date & time: 09/28/19  1740      History   Chief Complaint Chief Complaint  Patient presents with  . Fatigue    HPI Sara Meza is a 44 y.o. female.   HPI  Sara Meza is a 44 y.o. female presenting to UC with c/o generalized fatigue, body aches, nausea, vomiting, diarrhea, fever of 100*F, generalized HA and sore throat for about 1 week.  She received her first COVID vaccine on August 18th 2021.  Pt has ESRD and does dialysis at home. She had a video visit with her PCP earlier today about her symptoms but was encouraged to be evaluated in person in the UC.  Pt denies chest pain, trouble breathing, palpitations or passing out. No known sick contacts or recent travel. Slight decreased appetite.     Past Medical History:  Diagnosis Date  . Anemia   . Chronic kidney disease 02/22/2018   ESRD-dialysis, training to do dialysis at home. Currently does dialysis every day.   . CKD (chronic kidney disease), stage V ([place]) 02/23/2018  . Dialysis patient ([place])   . Diarrhea due to drug 07/24/2018  . GERD (gastroesophageal reflux disease)   . Hemolacria   . History of gestational diabetes 06/29/2012  . Hypertension     Patient Active Problem List   Diagnosis Date Noted  . Early satiety 07/24/2018  . Dyspepsia 07/24/2018  . Vitamin D deficiency 03/08/2018  . ESRD (end stage renal disease) on dialysis ([place]) 03/08/2018  . Anemia of chronic disease 03/08/2018  . Secondary hyperparathyroidism of renal origin ([place]) 03/08/2018  . Family history of colon cancer requiring screening colonoscopy 02/22/2018  . Rash and nonspecific skin eruption 02/22/2018  . History of varicose veins of lower extremity 02/22/2018  . Peripheral edema 02/22/2018  . Iron deficiency anemia due to chronic blood loss 02/22/2018  . Anemia in ESRD (end-stage renal disease) ([place]) 02/22/2018  . Menorrhagia with regular cycle 02/22/2018  . Hypertension goal BP (blood  pressure) < 130/80 02/21/2018  . Iron deficiency 02/21/2018  . Status post tubal ligation 02/21/2018  . Hypertensive retinopathy 02/21/2018  . Essential hypertension 12/20/2014  . Left ankle pain 08/25/2012  . History of gestational diabetes 06/29/2012  . 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 Bilateral plantar fasciitis 06/29/2012  . Restless legs 06/29/2012    Past Surgical History:  Procedure Laterality Date  . AV FISTULA PLACEMENT Right 03/02/2018   Procedure: ARTERIOVENOUS (AV) FISTULA CREATION;  Surgeon: Waynetta Sandy, MD;  Location: [facility];  Service: Vascular;  Laterality: Right;  . ENDOVENOUS ABLATION SAPHENOUS VEIN W/ LASER    . HYSTEROSCOPY WITH D & C N/A 09/13/2018   Procedure: DILATATION AND CURETTAGE WITH MINERVA ABLATION;  Surgeon: Emily Filbert, MD;  Location: [facility];  Service: Gynecology;  Laterality: N/A;  Rep will be here confirmed on 09/06/18 CS  . IR FLUORO GUIDE CV LINE RIGHT  02/24/2018  . IR US GUIDE VASC ACCESS RIGHT  02/24/2018  . LAPAROSCOPY    . TUBAL LIGATION      OB History    Gravida  3   Para  3   Term      Preterm      AB      Living        SAB      TAB      Ectopic      Multiple      Live Births  Home Medications    Prior to Admission medications   Medication Sig Start Date End Date Taking? Authorizing Provider  acetaminophen (TYLENOL) 500 MG tablet Take 500-1,000 mg by mouth every 6 (six) hours as needed.    [provider]  Docusate Calcium (STOOL SOFTENER PO) Take 2 tablets by mouth daily. 08/15/19   [provider]  ferric citrate (AURYXIA) 1 GM 210 MG(Fe) tablet Take 3 tablets (630 mg total) by mouth 3 (three) times daily with meals. Patient taking differently: Take 630 mg by mouth 3 (three) times daily with meals. 2 tablets with a snack 03/03/18   Lavina Hamman, MD  gentamicin cream (GARAMYCIN) 0.1 % Apply 1 application topically daily.    [provider]  lactulose  (CHRONULAC) 10 GM/15ML solution Take 20 g by mouth 2 (two) times daily as needed.    [provider]  Methoxy PEG-Epoetin Beta (MIRCERA IJ) Inject 210 mg into the skin every 30 (thirty) days. 04/04/19   [provider]  ondansetron (ZOFRAN-ODT) 8 MG disintegrating tablet Take 1 tablet by mouth every 8 (eight) hours as needed. 02/23/19   [provider]  traZODone (DESYREL) 50 MG tablet Take 50 mg by mouth at bedtime. 05/28/19   [provider]    Family History Family History  Problem Relation Age of Onset  . Diabetes Father   . Hypertension Father   . Diabetes Mother   . Hypertension Mother   . Colon cancer Sister 5  . Wilm's tumor Sister   . Colon polyps Neg Hx   . Esophageal cancer Neg Hx   . Rectal cancer Neg Hx   . Stomach cancer Neg Hx     Social History Social History   Tobacco Use  . Smoking status: Never Smoker  . Smokeless tobacco: Never Used  Vaping Use  . Vaping Use: Never used  Substance Use Topics  . Alcohol use: Not Currently  . Drug use: Never     Allergies   Camellia, Temazepam, and Lisinopril   Review of Systems Review of Systems  Constitutional: Positive for fatigue and fever. Negative for chills.  HENT: Positive for congestion and sore throat. Negative for trouble swallowing and voice change.   Respiratory: Negative for cough.   Gastrointestinal: Positive for abdominal pain (mild generalized), diarrhea and vomiting.  Musculoskeletal: Positive for arthralgias, back pain and myalgias.  Neurological: Positive for headaches. Negative for dizziness.     Physical Exam Triage Vital Signs ED Triage Vitals  Enc Vitals Group     BP 09/28/19 1822 108/70     Pulse Rate 09/28/19 1822 99     Resp --      Temp 09/28/19 1822 98.9 F (37.2 C)     Temp Source 09/28/19 1822 Oral     SpO2 09/28/19 1822 99 %     Weight 09/28/19 1823 125 lb (56.7 kg)     Height 09/28/19 1823 5\' 6"  (1.676 m)     Head Circumference --       Peak Flow --      Pain Score 09/28/19 1823 8     Pain Loc --      Pain Edu? --      Excl. in [place]? --    No data found.  Updated Vital Signs BP 108/70 (BP Location: Right Arm)   Pulse 99   Temp 98.9 F (37.2 C) (Oral)   Ht 5\' 6"  (1.676 m)   Wt 125 lb (56.7 kg)  SpO2 99%   BMI 20.18 kg/m   Visual Acuity Right Eye Distance:   Left Eye Distance:   Bilateral Distance:    Right Eye Near:   Left Eye Near:    Bilateral Near:     Physical Exam Vitals and nursing note reviewed.  Constitutional:      General: She is not in acute distress.    Appearance: Normal appearance. She is well-developed. She is not ill-appearing, toxic-appearing or diaphoretic.     Comments: Pt sitting comfortably on exam bed, appears well, NAD.   HENT:     Head: Normocephalic and atraumatic.     Right Ear: Tympanic membrane and ear canal normal.     Left Ear: Tympanic membrane and ear canal normal.     Nose: Nose normal.     Right Sinus: No maxillary sinus tenderness or frontal sinus tenderness.     Left Sinus: No maxillary sinus tenderness or frontal sinus tenderness.     Mouth/Throat:     Lips: Pink.     Mouth: Mucous membranes are moist.     Pharynx: Oropharynx is clear. Uvula midline. No pharyngeal swelling, oropharyngeal exudate, posterior oropharyngeal erythema or uvula swelling.  Cardiovascular:     Rate and Rhythm: Normal rate and regular rhythm.  Pulmonary:     Effort: Pulmonary effort is normal. No respiratory distress.     Breath sounds: Normal breath sounds. No stridor. No wheezing, rhonchi or rales.  Abdominal:     General: There is no distension.     Palpations: Abdomen is soft.     Tenderness: There is no abdominal tenderness. There is no right CVA tenderness or left CVA tenderness.  Musculoskeletal:        General: Normal range of motion.     Cervical back: Normal range of motion and neck supple. No tenderness.  Lymphadenopathy:     Cervical: No cervical adenopathy.  Skin:     General: Skin is warm and dry.  Neurological:     Mental Status: She is alert and oriented to person, place, and time.  Psychiatric:        Behavior: Behavior normal.      UC Treatments / Results  Labs (all labs ordered are listed, but only abnormal results are displayed) Labs Reviewed  COMPLETE METABOLIC PANEL WITH GFR - Abnormal; Notable for the following components:      Result Value   Glucose, Bld 115 (*)    BUN 56 (*)    Creat 14.37 (*)    GFR, Est Non African American 3 (*)    GFR, Est African American <4 (*)    BUN/Creatinine Ratio 4 (*)    Sodium 129 (*)    Chloride 82 (*)    ALT 39 (*)    All other components within normal limits  SARS-COV-2 RNA,(COVID-19) QUALITATIVE NAAT  POCT CBC W AUTO DIFF ([facility])    EKG   Radiology No results found.  Procedures Procedures (including critical care time)  Medications Ordered in UC Medications - No data to display  Initial Impression / Assessment and Plan / UC Course  I have reviewed the triage vital signs and the nursing notes.  Pertinent labs & imaging results that were available during my care of the patient were reviewed by me and considered in my medical decision making (see chart for details).    Pt appears well, NAD.  Vitals WNL CBC unremarkable in UC CMP and COVID pending at time of discharge  Discussed symptoms that warrant emergent care in the ED. AVS given  Final Clinical Impressions(s) / UC Diagnoses   Final diagnoses:  Acute upper respiratory infection  Nausea vomiting and diarrhea  Body aches  ESRD on dialysis [facility])     Discharge Instructions      Please follow up closely with your primary care provider as well as dialysis clinic.  Do not hesitate to call 911 or have someone drive you to the hospital if symptoms worsening this evening or this weekend- severe pain, unable to keep down fluids, dizziness, passing out, or other new concerning symptoms develop. You will be  notified tomorrow of your labs that were sent out today. You may be advised to go to the hospital depending on those results.     ED Prescriptions    None     PDMP not reviewed this encounter.   Noe Gens, [place] 09/29/19 2255

## 2019-09-28 NOTE — ED Triage Notes (Signed)
Fatigue, body aches nausea, vomiting, diarrhea,fever, headache, sore throat x 1 week. Had first shot on the 18th

## 2019-09-28 NOTE — Discharge Instructions (Signed)
  Please follow up closely with your primary care provider as well as dialysis clinic.  Do not hesitate to call 911 or have someone drive you to the hospital if symptoms worsening this evening or this weekend- severe pain, unable to keep down fluids, dizziness, passing out, or other new concerning symptoms develop. You will be notified tomorrow of your labs that were sent out today. You may be advised to go to the hospital depending on those results.

## 2019-09-29 ENCOUNTER — Telehealth: Payer: Self-pay | Admitting: Emergency Medicine

## 2019-09-29 LAB — COMPLETE METABOLIC PANEL WITH GFR
AG Ratio: 1.3 (calc) (ref 1.0–2.5)
ALT: 39 U/L — ABNORMAL HIGH (ref 6–29)
AST: 24 U/L (ref 10–35)
Albumin: 3.9 g/dL (ref 3.6–5.1)
Alkaline phosphatase (APISO): 122 U/L (ref 31–125)
BUN/Creatinine Ratio: 4 (calc) — ABNORMAL LOW (ref 6–22)
BUN: 56 mg/dL — ABNORMAL HIGH (ref 7–25)
CO2: 28 mmol/L (ref 20–32)
Calcium: 10 mg/dL (ref 8.6–10.2)
Chloride: 82 mmol/L — ABNORMAL LOW (ref 98–110)
Creat: 14.37 mg/dL — ABNORMAL HIGH (ref 0.50–1.10)
GFR, Est African American: <4 mL/min/{1.73_m2} — ABNORMAL LOW (ref >=60)
GFR, Est Non African American: 3 mL/min/{1.73_m2} — ABNORMAL LOW (ref >=60)
Globulin: 3.1 g/dL (calc) (ref 1.9–3.7)
Glucose, Bld: 115 mg/dL — ABNORMAL HIGH (ref 65–99)
Potassium: 4.3 mmol/L (ref 3.5–5.3)
Sodium: 129 mmol/L — ABNORMAL LOW (ref 135–146)
Total Bilirubin: 0.4 mg/dL (ref 0.2–1.2)
Total Protein: 7 g/dL (ref 6.1–8.1)

## 2019-09-29 LAB — SARS-COV-2 RNA,(COVID-19) QUALITATIVE NAAT: SARS CoV2 RNA: NOT DETECTED

## 2019-09-29 NOTE — Telephone Encounter (Signed)
Received call from [facility] with abnormal creatinine level on yesterday's bloodwork; printed CMP and handed to Dr.Lauenstein. Asked to tell patient; labs are stable; no new explanation for her fatigue; please follow up with her kidney doctor. She understands and will comply.

## 2019-10-03 ENCOUNTER — Emergency Department (HOSPITAL_COMMUNITY)
Admission: EM | Admit: 2019-10-03 | Discharge: 2019-10-03 | Disposition: A | Discharge status: Home / Self Care | Payer: Medicare Other | Attending: Emergency Medicine | Admitting: Emergency Medicine

## 2019-10-03 ENCOUNTER — Encounter (HOSPITAL_COMMUNITY): Payer: Self-pay

## 2019-10-03 ENCOUNTER — Other Ambulatory Visit: Payer: Self-pay

## 2019-10-03 DIAGNOSIS — Z5321 Procedure and treatment not carried out due to patient leaving prior to being seen by health care provider: Secondary | ICD-10-CM | POA: Diagnosis not present

## 2019-10-03 DIAGNOSIS — R509 Fever, unspecified: Secondary | ICD-10-CM | POA: Diagnosis not present

## 2019-10-03 DIAGNOSIS — R531 Weakness: Secondary | ICD-10-CM | POA: Diagnosis not present

## 2019-10-03 NOTE — ED Triage Notes (Deleted)
Pt arrives POV for eval of a fall last week. Pt reports she slipped and fell in a bathroom and struck her head on a toilet. Denies blood thinners, reports ongoing HA. States also w/ back pain at this time and dysuria.

## 2019-10-03 NOTE — ED Notes (Signed)
Called  For triage x 5

## 2019-10-04 ENCOUNTER — Ambulatory Visit (INDEPENDENT_AMBULATORY_CARE_PROVIDER_SITE_OTHER): Payer: Medicare Other | Admitting: Medical-Surgical

## 2019-10-04 ENCOUNTER — Encounter: Payer: Self-pay | Admitting: Medical-Surgical

## 2019-10-04 VITALS — BP 109/70 | HR 100 | Temp 98.9°F | Ht 67.0 in | Wt 126.5 lb

## 2019-10-04 DIAGNOSIS — R509 Fever, unspecified: Secondary | ICD-10-CM | POA: Diagnosis not present

## 2019-10-04 DIAGNOSIS — R61 Generalized hyperhidrosis: Secondary | ICD-10-CM

## 2019-10-04 DIAGNOSIS — R634 Abnormal weight loss: Secondary | ICD-10-CM

## 2019-10-04 DIAGNOSIS — R197 Diarrhea, unspecified: Secondary | ICD-10-CM

## 2019-10-04 DIAGNOSIS — R112 Nausea with vomiting, unspecified: Secondary | ICD-10-CM | POA: Diagnosis not present

## 2019-10-04 DIAGNOSIS — R05 Cough: Secondary | ICD-10-CM

## 2019-10-04 DIAGNOSIS — R059 Cough, unspecified: Secondary | ICD-10-CM

## 2019-10-04 DIAGNOSIS — R5383 Other fatigue: Secondary | ICD-10-CM | POA: Diagnosis not present

## 2019-10-04 MED ORDER — ONDANSETRON 8 MG PO TBDP: 8.0000 mg | ORAL_TABLET | Freq: Three times a day (TID) | ORAL | 3 refills | Status: DC | PRN

## 2019-10-04 NOTE — Progress Notes (Signed)
Subjective:    CC: UC/ED follow up  HPI: Pleasant 44 year old female presenting today for follow-up after an urgent care and emergency room visit. Recently she was seen at urgent care for nausea/vomiting/diarrhea after a video visit with me. At that time they did some blood work and checked her for Covid but sent her home without treatment. Since then, she has had resolution of the nausea/vomiting/diarrhea she continues to have nightly fevers, last night 101.8. She is also having night sweats and shortness of breath. She does have a cough that is nonproductive and is mostly at night. Notes a 10 pound weight loss over the last 2 weeks. She has significant body aches requiring frequent rest periods and is in a state of constant fatigue. She has been taking Tylenol every 8 hours. Notes that it does reduce her fever temporarily but it wears off after a few hours. She has followed up with her nephrologist who is managing her peritoneal dialysis. She reports she is on the transplant list and her son is being tested for a possible match.  Reports that she just left the emergency room prior to coming to her appointment this morning. She was seen there for a rash that was determined to be a sort of contact dermatitis. She is unsure what caused this rash and the only new food/cosmetic/chemical that she has been exposed to was a soup that she ate several days ago. She notes that the soup tasted funny. She was treated at the emergency room with an injection of Decadron.  I reviewed the past medical history, family history, social history, surgical history, and allergies today and no changes were needed.  Please see the problem list section below in epic for further details.  Past Medical History: Past Medical History:  Diagnosis Date  . Anemia   . Chronic kidney disease 02/22/2018   ESRD-dialysis, training to do dialysis at home. Currently does dialysis every day.   . CKD (chronic kidney disease), stage V  ([place]) 02/23/2018  . Dialysis patient ([place])   . Diarrhea due to drug 07/24/2018  . GERD (gastroesophageal reflux disease)   . Hemolacria   . History of gestational diabetes 06/29/2012  . Hypertension    Past Surgical History: Past Surgical History:  Procedure Laterality Date  . AV FISTULA PLACEMENT Right 03/02/2018   Procedure: ARTERIOVENOUS (AV) FISTULA CREATION;  Surgeon: Waynetta Sandy, MD;  Location: [facility];  Service: Vascular;  Laterality: Right;  . ENDOVENOUS ABLATION SAPHENOUS VEIN W/ LASER    . HYSTEROSCOPY WITH D & C N/A 09/13/2018   Procedure: DILATATION AND CURETTAGE WITH MINERVA ABLATION;  Surgeon: Emily Filbert, MD;  Location: [facility];  Service: Gynecology;  Laterality: N/A;  Rep will be here confirmed on 09/06/18 CS  . IR FLUORO GUIDE CV LINE RIGHT  02/24/2018  . IR US GUIDE VASC ACCESS RIGHT  02/24/2018  . LAPAROSCOPY    . TUBAL LIGATION     Social History: Social History   Socioeconomic History  . Marital status: Married    Spouse name: Not on file  . Number of children: 3  . Years of education: Not on file  . Highest education level: Not on file  Occupational History  . Occupation: homemaker  Tobacco Use  . Smoking status: Never Smoker  . Smokeless tobacco: Never Used  Vaping Use  . Vaping Use: Never used  Substance and Sexual Activity  . Alcohol use: Not Currently  . Drug use: Never  . Sexual activity:  Yes    Birth control/protection: Surgical, None  Other Topics Concern  . Not on file  Social History Narrative  . Not on file   Social Determinants of Health   Financial Resource Strain:   . Difficulty of Paying Living Expenses: Not on file  Food Insecurity:   . Worried About Charity fundraiser in the Last Year: Not on file  . Ran Out of Food in the Last Year: Not on file  Transportation Needs:   . Lack of Transportation (Medical): Not on file  . Lack of Transportation (Non-Medical): Not on file  Physical Activity:   . Days of Exercise per Week:  Not on file  . Minutes of Exercise per Session: Not on file  Stress:   . Feeling of Stress : Not on file  Social Connections:   . Frequency of Communication with Friends and Family: Not on file  . Frequency of Social Gatherings with Friends and Family: Not on file  . Attends Religious Services: Not on file  . Active Member of Clubs or Organizations: Not on file  . Attends Archivist Meetings: Not on file  . Marital Status: Not on file   Family History: Family History  Problem Relation Age of Onset  . Diabetes Father   . Hypertension Father   . Diabetes Mother   . Hypertension Mother   . Colon cancer Sister 84  . Wilm's tumor Sister   . Colon polyps Neg Hx   . Esophageal cancer Neg Hx   . Rectal cancer Neg Hx   . Stomach cancer Neg Hx    Allergies: Allergies  Allergen Reactions  . Camellia Other (See Comments)    Irregular menses  . Temazepam Hives  . Lisinopril Other (See Comments)    irregular menses   Medications: See med rec.  Review of Systems: See HPI for pertinent positives and negatives.   Objective:    General: Well Developed, well nourished, and in no acute distress.  Neuro: Alert and oriented x3. HEENT: Normocephalic, atraumatic.  Skin: Warm and dry. Cardiac: Regular rate and rhythm, no murmurs rubs or gallops, no lower extremity edema.  Respiratory: Clear to auscultation bilaterally. Not using accessory muscles, speaking in full sentences.  Impression and Recommendations:    1. Nausea vomiting and diarrhea Resolved. Eating and drinking ok. Continue Zofran 8mg  ODT every 8 hours as needed for nausea.  2. Fever/Cough/Fatigue/Night sweats/Unexplained weight loss Continued symptoms concerning for possible undetected infection. Recent Quantiferon Gold (9/3) negative. Chest x-ray and CT abdomen pelvis also negative for potential etiology. No growth on blood cultures from hospitalization 1 month ago. PD fluid tested with no further infection. No  previous explanation for symptoms. Ordering blood cultures x 2. If not anuric, running urine culture. Echocardiogram for evaluation of possible endocarditis. Will possibly need a referral to infectious disease depending on results of testing.   Return if symptoms worsen or fail to improve. ___________________________________________ Clearnce Sorrel, DNP, APRN, FNP-BC [facility] [place]

## 2019-10-16 LAB — CULTURE, BLOOD (SINGLE): MICRO NUMBER:: 10983581

## 2019-10-17 ENCOUNTER — Other Ambulatory Visit: Payer: Self-pay

## 2019-10-17 ENCOUNTER — Ambulatory Visit (HOSPITAL_BASED_OUTPATIENT_CLINIC_OR_DEPARTMENT_OTHER)
Admission: RE | Admit: 2019-10-17 | Discharge: 2019-10-17 | Disposition: A | Discharge status: Home / Self Care | Payer: Medicare Other | Source: Ambulatory Visit | Attending: Medical-Surgical | Admitting: Medical-Surgical

## 2019-10-17 DIAGNOSIS — R634 Abnormal weight loss: Secondary | ICD-10-CM

## 2019-10-17 DIAGNOSIS — R5383 Other fatigue: Secondary | ICD-10-CM | POA: Diagnosis not present

## 2019-10-17 DIAGNOSIS — R61 Generalized hyperhidrosis: Secondary | ICD-10-CM | POA: Insufficient documentation

## 2019-10-17 DIAGNOSIS — R509 Fever, unspecified: Secondary | ICD-10-CM | POA: Diagnosis not present

## 2019-10-17 LAB — ECHOCARDIOGRAM COMPLETE
Area-P 1/2: 3.17 cm2
S' Lateral: 2.55 cm

## 2019-10-18 ENCOUNTER — Other Ambulatory Visit: Payer: Self-pay | Admitting: Medical-Surgical

## 2019-10-18 DIAGNOSIS — L309 Dermatitis, unspecified: Secondary | ICD-10-CM

## 2019-10-25 ENCOUNTER — Other Ambulatory Visit: Payer: Self-pay

## 2019-10-25 ENCOUNTER — Encounter: Payer: Self-pay | Admitting: Medical-Surgical

## 2019-10-25 ENCOUNTER — Telehealth (INDEPENDENT_AMBULATORY_CARE_PROVIDER_SITE_OTHER): Payer: Medicare Other | Admitting: Medical-Surgical

## 2019-10-25 ENCOUNTER — Telehealth: Payer: Self-pay | Admitting: Gastroenterology

## 2019-10-25 VITALS — Temp 99.5°F

## 2019-10-25 DIAGNOSIS — R197 Diarrhea, unspecified: Secondary | ICD-10-CM

## 2019-10-25 DIAGNOSIS — K623 Rectal prolapse: Secondary | ICD-10-CM | POA: Diagnosis not present

## 2019-10-25 DIAGNOSIS — R509 Fever, unspecified: Secondary | ICD-10-CM | POA: Diagnosis not present

## 2019-10-25 DIAGNOSIS — R112 Nausea with vomiting, unspecified: Secondary | ICD-10-CM | POA: Diagnosis not present

## 2019-10-25 MED ORDER — PROMETHAZINE HCL 25 MG PO TABS: 25.0000 mg | ORAL_TABLET | Freq: Four times a day (QID) | ORAL | 2 refills | Status: DC | PRN

## 2019-10-25 NOTE — Telephone Encounter (Signed)
Spoke to patient who reports having a bowel movement this morning and noted her rectum prolapsed. She states this has been happening more frequently when she sits on the toilet or coughs hard. She is able to push it back up without difficulty. She had a colonoscopy around 4/21 this year. Patient would like to discuss options. Dr Lyndel Safe would you do a direct referral to a colorectal surgeon or do you need to see her in office.

## 2019-10-25 NOTE — Telephone Encounter (Signed)
No rectal prolapse was noted at the time of colonoscopy. Can you please bring her into my clinic or [facility] (whichever is faster) RG

## 2019-10-25 NOTE — Telephone Encounter (Signed)
Patient called states her rectum is out and she is bleeding a lot every time is coughs blood come out and she has to wear a pad please advise

## 2019-10-25 NOTE — Progress Notes (Signed)
Virtual Visit via Video Note  I connected with Sara Meza on 10/25/19 at 10:10 AM EDT by a video enabled telemedicine application and verified that I am speaking with the correct person using two identifiers.   I discussed the limitations of evaluation and management by telemedicine and the availability of in person appointments. The patient expressed understanding and agreed to proceed.  Patient location: home Provider locations: office  Subjective:    CC: Nausea/vomiting/diarrhea, fever  HPI: Pleasant 44 year old female presenting via MyChart video visit with reports of 2 days of nausea, vomiting, diarrhea, and fever T-max 101.1 last night.  Notes that this started immediately after dialysis 2 days ago.  Previously on PD, she has now been switched to hemodialysis.  They are using her AV fistula.  Her first hemodialysis treatment on Saturday went well with no difficulties.  Unfortunately on Tuesday, her blood pressure dropped requiring them to place her in Trendelenburg for quite an extended period of time.  She notes that her toes are still somewhat numb since having them well above her head for nearly 2 hours.  She is unsure how low her blood pressure went but notes that they were frequently giving her fluid back throughout the treatment.  She does still have her Tenckhoff in place and her last PD treatment was 7 days ago.  She is not sure when her last Tenckhoff catheter flush was.  Her nausea/vomiting has been significant over the past couple of days and she has been able to keep any foods or liquids down.  She reports having to keep a trash can with her while she is having diarrhea because she will also vomit at the same time.  Her diarrhea has been so bad that she notes that her mild rectal prolapse has now worsened.  She did try to push the prolapse back in and was successful.  Unfortunately, with her next episode of diarrhea it popped back out.  She has been trying to take Tylenol which has  reduced her fever.  She does have Zofran at home that she has not tried so far but is going to try after our visit.  Her blood pressure today is 114/77 with a pulse of 95.  She denies shortness of breath, chest pain, dizziness/lightheadedness.  She has not been tested for Covid since her symptoms started.  Past medical history, Surgical history, Family history not pertinant except as noted below, Social history, Allergies, and medications have been entered into the medical record, reviewed, and corrections made.   Review of Systems: See HPI for pertinent positives and negatives.   Objective:    General: Speaking clearly in complete sentences without any shortness of breath.  Alert and oriented x3.  Normal judgment. No apparent acute distress.  Impression and Recommendations:    1. Nausea vomiting and diarrhea/fever Try Zofran that she has on hand to see if this helps with nausea.  We will also send in Phenergan in case the Zofran is ineffective.  Okay to alternate these if necessary.  Low suspicion at this point for C. difficile colitis, okay to try Imodium to see if this will slow the down the diarrhea.  Recommend starting with small sips of water or juice.  Increase diet as tolerated starting with soft bland foods and adding in regular food as tolerated.  As the symptoms have been coming and going for a while now and are unexplained, referring to infectious disease for further evaluation.  She is missing dialysis today, advised to  contact nephrology regarding her symptoms and her missed treatment.  Also recommend Covid testing, discussed testing options and locations to find home tests.  2.Rectal prolapse With worsening rectal prolapse, recommend she contact Dr. Lyndel Safe (GI) for his recommendations.  This will likely need a surgical procedure to repair.  Return if symptoms worsen or fail to improve.  20 minutes of non face-to-face time was provided during this encounter.   I discussed the  assessment and treatment plan with the patient. The patient was provided an opportunity to ask questions and all were answered. The patient agreed with the plan and demonstrated an understanding of the instructions.   The patient was advised to call back or seek an in-person evaluation if the symptoms worsen or if the condition fails to improve as anticipated.  Clearnce Sorrel, DNP, APRN, FNP-BC [facility]

## 2019-10-26 NOTE — Telephone Encounter (Signed)
Spoke to patient to schedule her an appointment with Carl Best NP regarding her possible rectal prolapse. She agreed to go to the [place] office on 11/21/19 at 11 am for evaluation.

## 2019-10-28 HISTORY — PX: OTHER SURGICAL HISTORY: SHX169

## 2019-11-02 ENCOUNTER — Ambulatory Visit (INDEPENDENT_AMBULATORY_CARE_PROVIDER_SITE_OTHER): Payer: Medicare Other | Admitting: Medical-Surgical

## 2019-11-02 ENCOUNTER — Other Ambulatory Visit: Payer: Self-pay

## 2019-11-02 ENCOUNTER — Encounter: Payer: Self-pay | Admitting: Medical-Surgical

## 2019-11-02 VITALS — BP 159/89 | HR 97 | Temp 98.5°F | Ht 67.0 in | Wt 136.3 lb

## 2019-11-02 DIAGNOSIS — D631 Anemia in chronic kidney disease: Secondary | ICD-10-CM

## 2019-11-02 DIAGNOSIS — N186 End stage renal disease: Secondary | ICD-10-CM

## 2019-11-02 DIAGNOSIS — Z09 Encounter for follow-up examination after completed treatment for conditions other than malignant neoplasm: Secondary | ICD-10-CM

## 2019-11-02 DIAGNOSIS — Z114 Encounter for screening for human immunodeficiency virus [HIV]: Secondary | ICD-10-CM

## 2019-11-02 DIAGNOSIS — Z992 Dependence on renal dialysis: Secondary | ICD-10-CM | POA: Diagnosis not present

## 2019-11-02 MED ORDER — GABAPENTIN 100 MG PO CAPS: 100.0000 mg | ORAL_CAPSULE | Freq: Every day | ORAL | 1 refills | Status: DC

## 2019-11-02 NOTE — Progress Notes (Signed)
Subjective:    CC: hospital follow up  HPI: Pleasant 44 year old female presenting for hospital follow up after being admitted on 10/8 with discharge on 10/12. During her admission, she was diagnosed with multifocal pneumonia and was found to be significantly anemia with a hemoglobin of 6.1. Her Tenchkoff dialysis catheter was removed while inpatient and she has transitioned to hemodialysis 3 times weekly. I received a call from [facility] yesterday with reports that she was refusing to return to their facility for dialysis because "the chairs are dirty" and "they had roaches". The dialysis center needs to have labs drawn in order to be able to transfer her to a new center but she would not come in for lab draws. They are requesting [place] to have those labs completed so they can facilitate the transfer as quickly as possible.   Today, she is doing very well with no complaints of nausea, vomiting, diarrhea, or fevers.  She reports that she will be going back to hemodialysis tomorrow as scheduled for her treatment.  Notes that there is a plan to transfer her to a [facility] center and nephrologist in [facility].  She was at that center when she initially started dialysis and remembers that she liked it there.  Has been very careful with her fluid intake and is making careful dietary choices since she did miss her dialysis treatment yesterday.  Her Tenckhoff dialysis catheter site is healing well, Steri-Strips intact without drainage.  She is keeping this covered with a sterile gauze.  She also has Steri-Strips intact just above the umbilicus, no drainage.  Her right upper arm AV fistula has quite a bit of bruising related to infiltration on cannulation.  She notes that this is very sore.  Denies fever, chills, shortness of breath, chest pain, and lower extremity edema.  I reviewed the past medical history, family history, social history, surgical history, and allergies today and no changes were needed.   Please see the problem list section below in epic for further details.  Past Medical History: Past Medical History:  Diagnosis Date  . Anemia   . Chronic kidney disease 02/22/2018   ESRD-dialysis, training to do dialysis at home. Currently does dialysis every day.   . CKD (chronic kidney disease), stage V ([place]) 02/23/2018  . Dialysis patient ([place])   . Diarrhea due to drug 07/24/2018  . GERD (gastroesophageal reflux disease)   . Hemolacria   . History of gestational diabetes 06/29/2012  . Hypertension    Past Surgical History: Past Surgical History:  Procedure Laterality Date  . AV FISTULA PLACEMENT Right 03/02/2018   Procedure: ARTERIOVENOUS (AV) FISTULA CREATION;  Surgeon: Waynetta Sandy, MD;  Location: [place];  Service: Vascular;  Laterality: Right;  . ENDOVENOUS ABLATION SAPHENOUS VEIN W/ LASER    . HYSTEROSCOPY WITH D & C N/A 09/13/2018   Procedure: DILATATION AND CURETTAGE WITH MINERVA ABLATION;  Surgeon: Emily Filbert, MD;  Location: [place];  Service: Gynecology;  Laterality: N/A;  Rep will be here confirmed on 09/06/18 CS  . IR FLUORO GUIDE CV LINE RIGHT  02/24/2018  . IR US GUIDE VASC ACCESS RIGHT  02/24/2018  . LAPAROSCOPY    . TUBAL LIGATION     Social History: Social History   Socioeconomic History  . Marital status: Married    Spouse name: Not on file  . Number of children: 3  . Years of education: Not on file  . Highest education level: Not on file  Occupational History  .  Occupation: homemaker  Tobacco Use  . Smoking status: Never Smoker  . Smokeless tobacco: Never Used  Vaping Use  . Vaping Use: Never used  Substance and Sexual Activity  . Alcohol use: Not Currently  . Drug use: Never  . Sexual activity: Yes    Birth control/protection: Surgical, None  Other Topics Concern  . Not on file  Social History Narrative  . Not on file   Social Determinants of Health   Financial Resource Strain:   . Difficulty of Paying Living Expenses: Not on file  Food  Insecurity:   . Worried About Charity fundraiser in the Last Year: Not on file  . Ran Out of Food in the Last Year: Not on file  Transportation Needs:   . Lack of Transportation (Medical): Not on file  . Lack of Transportation (Non-Medical): Not on file  Physical Activity:   . Days of Exercise per Week: Not on file  . Minutes of Exercise per Session: Not on file  Stress:   . Feeling of Stress : Not on file  Social Connections:   . Frequency of Communication with Friends and Family: Not on file  . Frequency of Social Gatherings with Friends and Family: Not on file  . Attends Religious Services: Not on file  . Active Member of Clubs or Organizations: Not on file  . Attends Archivist Meetings: Not on file  . Marital Status: Not on file   Family History: Family History  Problem Relation Age of Onset  . Diabetes Father   . Hypertension Father   . Diabetes Mother   . Hypertension Mother   . Colon cancer Sister 49  . Wilm's tumor Sister   . Colon polyps Neg Hx   . Esophageal cancer Neg Hx   . Rectal cancer Neg Hx   . Stomach cancer Neg Hx    Allergies: Allergies  Allergen Reactions  . Azithromycin Rash    Itching, LE skin rash  . Camellia Other (See Comments)    Irregular menses  . Temazepam Hives  . Lisinopril Other (See Comments)    irregular menses   Medications: See med rec.  Review of Systems: See HPI for pertinent positives and negatives.   Objective:    General: Well Developed, well nourished, and in no acute distress.  Neuro: Alert and oriented x3.  HEENT: Normocephalic, atraumatic.  Skin: Warm and dry. Steristrips to right lower abdomen and above umbilicus intact with old drainage. Bruising to right upper arm surrounding AVF cannulation sites. Cardiac: Regular rate and rhythm, no murmurs rubs or gallops, no lower extremity edema.  Respiratory: Clear to auscultation bilaterally. Not using accessory muscles, speaking in full  sentences.    Impression and Recommendations:    1. Hospital discharge follow-up Sara Meza is doing very well today and appears to be recovering from her hospital stay.  2. ESRD (end stage renal disease) on dialysis [facility]) Discussed the need for adherence to dialysis regimen, especially in the setting of transplant work-up.  She will be returning to dialysis tomorrow for her treatment.  There is a plan for transfer in place but we will need to draw labs as below.  Since she did miss her dialysis treatment yesterday, we will go ahead and check a CMP to make sure there is no significant hyperkalemia. - Hepatitis B Surface AntiGEN - Hepatitis B Surface AntiBODY - Hepatitis B Core Antibody, total - Hepatitis C Antibody - HIV Antibody (routine testing w rflx) - COMPLETE  METABOLIC PANEL WITH GFR  3. Anemia in ESRD (end-stage renal disease) (HCC) Checking CBC to evaluate hemoglobin after significantly anemia during hospitalization. - CBC  4. Encounter for screening for human immunodeficiency virus (HIV)  HIV screening today to complete labs for transfer to a new dialysis center. - HIV Antibody (routine testing w rflx)  Return in about 3 months (around 02/02/2020) for chronic disease follow up. ___________________________________________ Clearnce Sorrel, DNP, APRN, FNP-BC Primary Care and [place]

## 2019-11-02 NOTE — Progress Notes (Signed)
Rx called in verbally to the pharmacy.

## 2019-11-05 LAB — COMPLETE METABOLIC PANEL WITH GFR
AG Ratio: 1.2 (calc) (ref 1.0–2.5)
ALT: 9 U/L (ref 6–29)
AST: 8 U/L — ABNORMAL LOW (ref 10–35)
Albumin: 3.4 g/dL — ABNORMAL LOW (ref 3.6–5.1)
Alkaline phosphatase (APISO): 122 U/L (ref 31–125)
BUN/Creatinine Ratio: 4 (calc) — ABNORMAL LOW (ref 6–22)
BUN: 41 mg/dL — ABNORMAL HIGH (ref 7–25)
CO2: 31 mmol/L (ref 20–32)
Calcium: 9.6 mg/dL (ref 8.6–10.2)
Chloride: 97 mmol/L — ABNORMAL LOW (ref 98–110)
Creat: 10.26 mg/dL — ABNORMAL HIGH (ref 0.50–1.10)
GFR, Est African American: 5 mL/min/{1.73_m2} — ABNORMAL LOW (ref >=60)
GFR, Est Non African American: 4 mL/min/{1.73_m2} — ABNORMAL LOW (ref >=60)
Globulin: 2.9 g/dL (calc) (ref 1.9–3.7)
Glucose, Bld: 88 mg/dL (ref 65–139)
Potassium: 5.4 mmol/L — ABNORMAL HIGH (ref 3.5–5.3)
Sodium: 142 mmol/L (ref 135–146)
Total Bilirubin: 0.6 mg/dL (ref 0.2–1.2)
Total Protein: 6.3 g/dL (ref 6.1–8.1)

## 2019-11-05 LAB — HEPATITIS B CORE ANTIBODY, TOTAL: Hep B Core Total Ab: NONREACTIVE

## 2019-11-05 LAB — HEPATITIS C ANTIBODY
Hepatitis C Ab: NONREACTIVE
SIGNAL TO CUT-OFF: 0.1 (ref <1.00)

## 2019-11-05 LAB — HIV ANTIBODY (ROUTINE TESTING W REFLEX): HIV 1&2 Ab, 4th Generation: NONREACTIVE

## 2019-11-05 LAB — CBC
HCT: 26.5 % — ABNORMAL LOW (ref 35.0–45.0)
Hemoglobin: 8.8 g/dL — ABNORMAL LOW (ref 11.7–15.5)
MCH: 31.2 pg (ref 27.0–33.0)
MCHC: 33.2 g/dL (ref 32.0–36.0)
MCV: 94 fL (ref 80.0–100.0)
MPV: 9.3 fL (ref 7.5–12.5)
Platelets: 314 10*3/uL (ref 140–400)
RBC: 2.82 10*6/uL — ABNORMAL LOW (ref 3.80–5.10)
RDW: 13.6 % (ref 11.0–15.0)
WBC: 7.8 10*3/uL (ref 3.8–10.8)

## 2019-11-05 LAB — HEPATITIS B SURFACE ANTIGEN: Hepatitis B Surface Ag: NONREACTIVE

## 2019-11-05 LAB — HEPATITIS B SURFACE ANTIBODY,QUALITATIVE: Hep B S Ab: REACTIVE — AB

## 2019-11-06 DIAGNOSIS — T782XXA Anaphylactic shock, unspecified, initial encounter: Secondary | ICD-10-CM | POA: Insufficient documentation

## 2019-11-06 DIAGNOSIS — Z992 Dependence on renal dialysis: Secondary | ICD-10-CM | POA: Insufficient documentation

## 2019-11-06 DIAGNOSIS — T7840XA Allergy, unspecified, initial encounter: Secondary | ICD-10-CM | POA: Insufficient documentation

## 2019-11-06 HISTORY — DX: Dependence on renal dialysis: Z99.2

## 2019-11-06 HISTORY — DX: Anaphylactic shock, unspecified, initial encounter: T78.2XXA

## 2019-11-12 DIAGNOSIS — E441 Mild protein-calorie malnutrition: Secondary | ICD-10-CM | POA: Insufficient documentation

## 2019-11-20 NOTE — Progress Notes (Signed)
Subjective:    CC: wound check post surgery  HPI: Pleasant 44 year old female presenting today with concerns regarding her surgery site from the Tenckhoff removal on 10/28/2019. She was seen by surgery in the hospital for 2 days after the procedure and reports being told the steri-strips would fall off and the packing would fall out on it's own. She has been cleaning the area daily and monitoring for infection. The steri-strips are starting to peel off but the packing is still in place and has not begun to come out even after three weeks. She is concerned as she has occasional bleeding and some discoloration on the packing. She is worried about possible infection. She is keeping the area covered with a gauze dressing to protect it from contamination and keep the drainage off her clothes. Was not instructed to follow up with surgery after her hospital discharge.   Has transitioned to the dialysis center in [facility] and is doing very well. Tolerating hemodialysis well now with no headaches, nausea, dizziness, or severe fatigue. Notes going home after her treatment and taking a nap. When she wakes up, she feels good and is able to go and do her normal activities.   I reviewed the past medical history, family history, social history, surgical history, and allergies today and no changes were needed.  Please see the problem list section below in epic for further details.  Past Medical History: Past Medical History:  Diagnosis Date  . Anemia   . Chronic kidney disease 02/22/2018   ESRD-dialysis, training to do dialysis at home. Currently does dialysis every day.   . CKD (chronic kidney disease), stage V ([place]) 02/23/2018  . Dialysis patient ([place])   . Diarrhea due to drug 07/24/2018  . GERD (gastroesophageal reflux disease)   . Hemolacria   . History of gestational diabetes 06/29/2012  . Hypertension    Past Surgical History: Past Surgical History:  Procedure Laterality Date  . AV FISTULA PLACEMENT  Right 03/02/2018   Procedure: ARTERIOVENOUS (AV) FISTULA CREATION;  Surgeon: Waynetta Sandy, MD;  Location: [place];  Service: Vascular;  Laterality: Right;  . ENDOVENOUS ABLATION SAPHENOUS VEIN W/ LASER    . HYSTEROSCOPY WITH D & C N/A 09/13/2018   Procedure: DILATATION AND CURETTAGE WITH MINERVA ABLATION;  Surgeon: Emily Filbert, MD;  Location: [place];  Service: Gynecology;  Laterality: N/A;  Rep will be here confirmed on 09/06/18 CS  . IR FLUORO GUIDE CV LINE RIGHT  02/24/2018  . IR US GUIDE VASC ACCESS RIGHT  02/24/2018  . LAPAROSCOPY    . TUBAL LIGATION     Social History: Social History   Socioeconomic History  . Marital status: Married    Spouse name: Not on file  . Number of children: 3  . Years of education: Not on file  . Highest education level: Not on file  Occupational History  . Occupation: homemaker  Tobacco Use  . Smoking status: Never Smoker  . Smokeless tobacco: Never Used  Vaping Use  . Vaping Use: Never used  Substance and Sexual Activity  . Alcohol use: Not Currently  . Drug use: Never  . Sexual activity: Yes    Birth control/protection: Surgical, None  Other Topics Concern  . Not on file  Social History Narrative  . Not on file   Social Determinants of Health   Financial Resource Strain:   . Difficulty of Paying Living Expenses: Not on file  Food Insecurity:   . Worried About Running  Out of Food in the Last Year: Not on file  . Ran Out of Food in the Last Year: Not on file  Transportation Needs:   . Lack of Transportation (Medical): Not on file  . Lack of Transportation (Non-Medical): Not on file  Physical Activity:   . Days of Exercise per Week: Not on file  . Minutes of Exercise per Session: Not on file  Stress:   . Feeling of Stress : Not on file  Social Connections:   . Frequency of Communication with Friends and Family: Not on file  . Frequency of Social Gatherings with Friends and Family: Not on file  . Attends Religious Services: Not  on file  . Active Member of Clubs or Organizations: Not on file  . Attends Archivist Meetings: Not on file  . Marital Status: Not on file   Family History: Family History  Problem Relation Age of Onset  . Diabetes Father   . Hypertension Father   . Diabetes Mother   . Hypertension Mother   . Colon cancer Sister 28  . Wilm's tumor Sister   . Colon polyps Neg Hx   . Esophageal cancer Neg Hx   . Rectal cancer Neg Hx   . Stomach cancer Neg Hx    Allergies: Allergies  Allergen Reactions  . Azithromycin Rash    Itching, LE skin rash  . Camellia Other (See Comments)    Irregular menses  . Temazepam Hives  . Lisinopril Other (See Comments)    irregular menses   Medications: See med rec.  Review of Systems: See HPI for pertinent positives and negatives.   Objective:    General: Well Developed, well nourished, and in no acute distress.  Neuro: Alert and oriented x3.  HEENT: Normocephalic, atraumatic.  Skin: Warm and dry. Steristrips intact with peeling edges at the site just superior to the umbilicus. RLQ Tenckhoff site packed with Iodoform packing gauze tightly with 1.5 inch tab protruding. No purulent drainage, erythema, heat, or swelling to the site. No foul odors. Edges of wound have begun healing forming a lip of scar tissue around the top, trapping the packing in place. Packing saturated with sterile saline and removed with gentle manipulation. Wound approximately 0.75cm round and 0.75cm deep with granulation tissue and minimal bleeding.  Cardiac: Regular rate and rhythm, no murmurs rubs or gallops, no lower extremity edema.  Respiratory: Clear to auscultation bilaterally. Not using accessory muscles, speaking in full sentences.   Impression and Recommendations:    1. Visit for wound check No signs of infection today. Packing in wound was very tight but scar tissue around the lip preventing it from "falling out" as expected. Packing in place for at least 3 weeks  and adhered to wound bed. Packing removed today to evaluate for healing and infection. Wound repacked with iodoform gauze and covered with sterile band-aid. Packing provided to patient with instructions to change daily and keep covered. Can expect a little bleeding today due to manipulation but if this does not stop, will need re-evaluation. Reviewed signs of infection or wound complications to monitor for. Patient verbalized understanding and is competent to perform the instructions given. If she has any difficulties or concerns, advised her to return for further evaluation.   Return if symptoms worsen or fail to improve. ___________________________________________ Clearnce Sorrel, DNP, APRN, FNP-BC Primary Care and [place]

## 2019-11-21 ENCOUNTER — Encounter: Payer: Self-pay | Admitting: Medical-Surgical

## 2019-11-21 ENCOUNTER — Ambulatory Visit (INDEPENDENT_AMBULATORY_CARE_PROVIDER_SITE_OTHER): Payer: Medicare Other | Admitting: Medical-Surgical

## 2019-11-21 ENCOUNTER — Ambulatory Visit (INDEPENDENT_AMBULATORY_CARE_PROVIDER_SITE_OTHER): Payer: Medicare Other | Admitting: Nurse Practitioner

## 2019-11-21 ENCOUNTER — Encounter: Payer: Self-pay | Admitting: Nurse Practitioner

## 2019-11-21 VITALS — BP 135/85 | HR 87 | Temp 98.2°F | Ht 67.0 in | Wt 134.4 lb

## 2019-11-21 DIAGNOSIS — Z5189 Encounter for other specified aftercare: Secondary | ICD-10-CM | POA: Diagnosis not present

## 2019-11-21 DIAGNOSIS — K623 Rectal prolapse: Secondary | ICD-10-CM

## 2019-11-21 MED ORDER — HYDROCORTISONE ACETATE 25 MG RE SUPP: 25.0000 mg | Freq: Every day | RECTAL | 0 refills | Status: AC

## 2019-11-21 NOTE — Patient Instructions (Signed)
We have sent the following medications to your pharmacy for you to pick up at your convenience: Anusol HC suppositories at bedtime x 5 days.   Continue Miralax 1-2 x daily to avoid straining.   Follow up with nephrology for anemia.

## 2019-11-21 NOTE — Progress Notes (Signed)
11/21/2019 Sara Meza [id number] [birth year]   Chief Complaint: rectal prolapse   History of Present Illness:  Sara Meza is a 44 year old female with a past medical history of hypertension and ESRD previously on peritoneal dialysis then switched to hemodialysis. She presents to our office today for further evaluation for possible rectal prolapse. She has a history of constipation which occurred while she was on peritoneal dialysis at home. She initially took Miralax QD and a stool softener and her constipation improved. However, this regimen stopped working a few months ago. She was prescribed Lactulose 56ml which she takes if no BM in 2 days.  Lactulose resulted in loose stools with occasional fecal leakage after coughing or sneezing which was undesirable. Approximately, one month ago when she was constipated she felt prolapsed rectal tissue. She was concerned her rectum was falling out. She manually pushed in the prolapsed tissue in for a few days without further prolapse. No rectal bleeding. No black stools. She has her labs done routinely during dialysis. She stated her Hg level was 6.0. on Sat. 10/30 and she was advised by her nephrologist to repeat a H/H on 11/2, these results are pending. Her most recent labs in [place] 11/02/2019 showed a Hg level of 8.8 which is her baseline level  and HCT 26.5. She has dialysis tomorrow and she will further discuss her lab results with her nephrologist. She is on a daily 40 oz fluid restriction.  She denies being on erythropoietin or IV iron.  She denies ever needing a blood transfusion.  No nausea or vomiting.  No dysphagia or heartburn.  She underwent an EGD and colonoscopy by Dr. Lyndel Safe 05/03/2019.  The EGD showed minimal gastritis.  The colonoscopy showed mild melanosis throughout the colon and 3 polyps were removed and 2 small lipomas to the distal transverse colon were removed, a few small diverticula in the sigmoid and descending and ascending colon  and nonbleeding small internal hemorrhoids were noted.  Sister with history of colon cancer was diagnosed in her 30s.   CBC Latest Ref Rng & Units 11/02/2019 05/17/2019 05/02/2019  WBC 3.8 - 10.8 Thousand/uL 7.8 7.7 8.1  Hemoglobin 11.7 - 15.5 g/dL 8.8(L) 8.5(L) 8.4(L)  Hematocrit 35 - 45 % 26.5(L) 26.0(L) 24.4(L)  Platelets 140 - 400 Thousand/uL 314 226 313    EGD 05/03/2019 by Dr. Lyndel Safe: - The examined esophagus was normal with normal well-defined Z-line at 38 cm.  - Stomach was J-shaped. Localized minimal inflammation characterized by erythema was found in the gastric antrum. Biopsies were taken with a cold forceps for histology. - The examined duodenum was normal. Biopsies for histology were taken with a cold forceps for evaluation of celiac disease. -Minimal gastritis  Colonoscopy by Dr. Lyndel Safe 05/03/2019: - An area of mild melanosis was found in the entire colon. Biopsies were taken with a cold forceps for histology. - Three sessile polyps were found in the proximal transverse colon and proximal ascending colon. The polyps were 4 to 8 mm in size. Smaller polyps were removed with a cold snare and larger with hot snare. Resection and retrieval were complete. - There were 2 small lipoma, 8 mm in diameter, in the distal transverse colon. - A few small-mouthed diverticula were found in the sigmoid colon, descending colon and ascending colon. - Non-bleeding internal hemorrhoids were found during retroflexion. The hemorrhoids were small. - The terminal ileum appeared normal. - The exam was otherwise without abnormality on direct and retroflexion views.  1.  Surgical [P], small bowel - BENIGN SMALL BOWEL MUCOSA. - NO VILLOUS ATROPHY, INFLAMMATION OR OTHER ABNORMALITIES PRESENT. 2. Surgical [P], gastric - ESSENTIALLY UNREMARKABLE GASTRIC-TYPE MUCOSA. - THERE IS NO EVIDENCE OF HELICOBACTER PYLORI, DYSPLASIA, OR MALIGNANCY. 3. Surgical [P], colon, transverse and ascending, polyp (3) -  SESSILE SERRATED POLYP WITHOUT CYTOLOGIC DYSPLASIA. - MELANOSIS COLI. 4. Surgical [P], colon, rectum and sigmoid - MELANOSIS COLI. - THERE IS NO EVIDENCE OF SIGNIFICANT INFLAMMATION, DYSPLASIA, OR MALIGNANCY   Current Outpatient Medications on File Prior to Visit  Medication Sig Dispense Refill  . acetaminophen (TYLENOL) 500 MG tablet Take 500-1,000 mg by mouth every 6 (six) hours as needed.    . gabapentin (NEURONTIN) 100 MG capsule Take 1 capsule (100 mg total) by mouth at bedtime. 30 capsule 1  . sevelamer carbonate (RENVELA) 800 MG tablet Take by mouth.     No current facility-administered medications on file prior to visit.    Allergies  Allergen Reactions  . Azithromycin Rash    Itching, LE skin rash  . Camellia Other (See Comments)    Irregular menses  . Temazepam Hives  . Lisinopril Other (See Comments)    irregular menses    Current Medications, Allergies, Past Medical History, Past Surgical History, Family History and Social History were reviewed in [employer] record.   Review of Systems:   Constitutional: Negative for fever, sweats, chills or weight loss.  Respiratory: Negative for shortness of breath.   Cardiovascular: Negative for chest pain, palpitations and leg swelling.  Gastrointestinal: See HPI.  Musculoskeletal: Negative for back pain or muscle aches.  Neurological: Negative for dizziness, headaches or paresthesias.    Physical Exam: BP 118/64   Pulse 85   Ht 5\' 7"  (1.702 m)   Wt 134 lb (60.8 kg)   BMI 20.99 kg/m  General: Well developed 44 year old female in no acute distress. Head: Normocephalic and atraumatic. Eyes: No scleral icterus. Conjunctiva pink . Ears: Normal auditory acuity. Mouth: Dentition intact. No ulcers or lesions.  Lungs: Clear throughout to auscultation. Heart: Regular rate and rhythm, no murmur. Fistula bruit radiates into chest.  Abdomen: Soft, nontender and nondistended. No masses or hepatomegaly.  Normal bowel sounds x 4 quadrants. Right abdominal PD cath site drsg inact (PD catheter was removed).  Rectal: Thickened anal folds. No fissure. No prolapsed internal hemorrhoids. No evidence of rectal prolapse when patient bared down. Small internal hemorrhoids. No blood or stool in the rectal vault. Maya CMA present during exam.  Musculoskeletal: Symmetrical with no gross deformities. Extremities: No edema. LUE fistula with + bruit and thrill  Neurological: Alert oriented x 4. No focal deficits.  Psychological: Alert and cooperative. Normal mood and affect  Assessment and Recommendations:  54. 44 year old female with possible rectal prolapse vs internal hemorrhoids with prolapse. No evidence of hemorrhoidal or rectal prolapse on exam today.  -Avoid straining -Miralax bid as tolerated  -Lactulose QD, reduce to 1 tbsp = 13ml if needed  -Patient to manually push prolapsed tissue back into rectum with Vaseline as needed -Patient to call our office if symptoms recur  -Follow up in office in 6 weeks  2. ESRD on hemodialysis   3. Acute on chronic anemia most likely due to ESRD.  Baseline Hg 8.8. Patient reported Hg level of 6 on Sat 10/30. Repeat H/H pending. Nephrology following. No overt GI bleeding. EGD/colonoscopy 04/2019 without source of anemia. -I will request a copy of her H/H done on 11/2  -Dr. Lyndel Safe to  verify if small bowel capsule endoscopy warranted with reported drop in her Hg level of 6.   4. History of a sessile serrated colon polyp 04/2019 -Next colonoscopy due 04/2022

## 2019-12-02 NOTE — Progress Notes (Signed)
Agree with excellent plan. RG 

## 2019-12-31 ENCOUNTER — Other Ambulatory Visit: Payer: Self-pay | Admitting: Medical-Surgical

## 2019-12-31 DIAGNOSIS — E8770 Fluid overload, unspecified: Secondary | ICD-10-CM | POA: Insufficient documentation

## 2020-01-07 ENCOUNTER — Ambulatory Visit: Payer: Medicare Other | Admitting: Nurse Practitioner

## 2020-01-31 ENCOUNTER — Other Ambulatory Visit: Payer: Self-pay | Admitting: Medical-Surgical

## 2020-06-06 ENCOUNTER — Other Ambulatory Visit: Payer: Self-pay | Admitting: *Deleted

## 2020-06-06 DIAGNOSIS — N186 End stage renal disease: Secondary | ICD-10-CM

## 2020-06-06 DIAGNOSIS — Z992 Dependence on renal dialysis: Secondary | ICD-10-CM

## 2020-06-16 IMAGING — XA IR FLUORO GUIDE CV LINE*R*
1 series · 1 of 1 positions shown · non-contrast
Comparison: none

INDICATION: 42-year-old female with a history of renal failure

[Series 1: fl(-)  angio sharp · 1 of 1 slices shown]
[im 1/1]
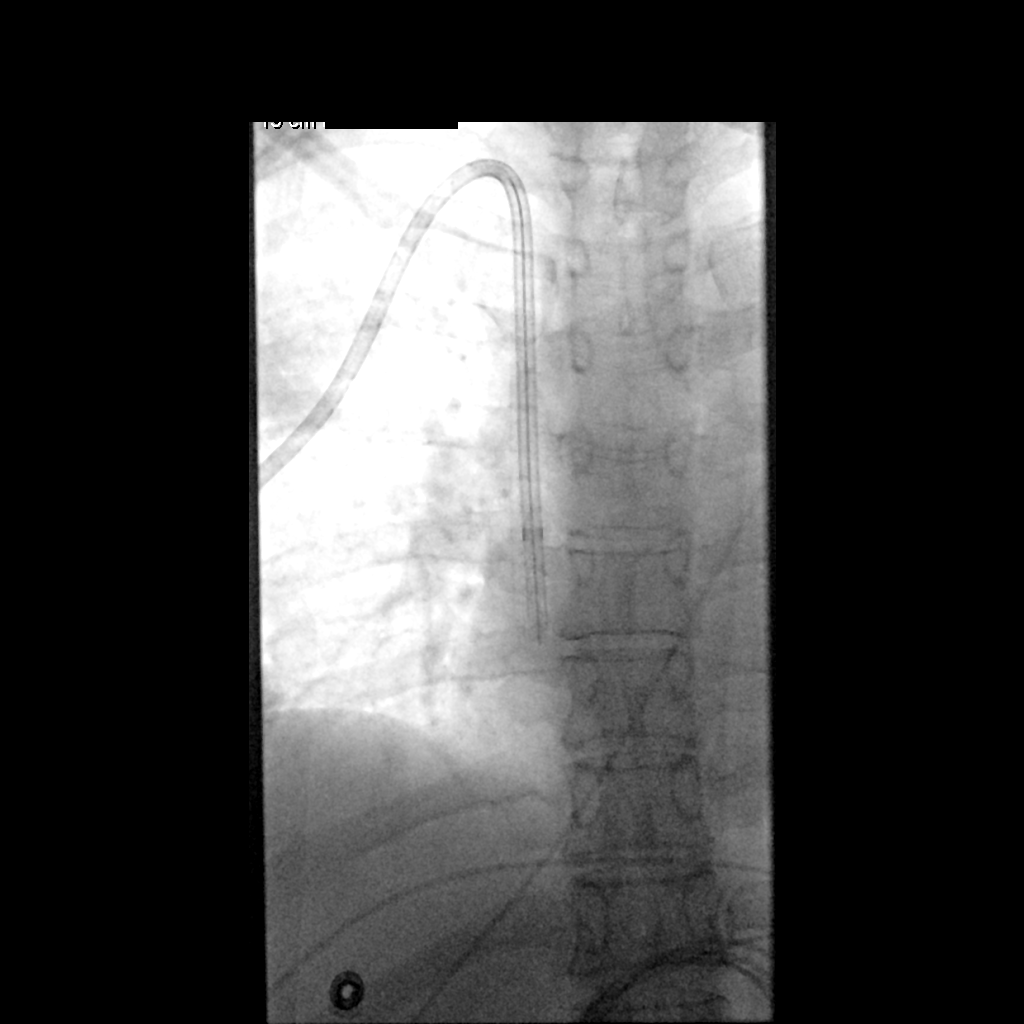

[1 of 1 positions shown; findings below may reference images not displayed]

EXAM:
TUNNELED CENTRAL VENOUS HEMODIALYSIS CATHETER PLACEMENT WITH
ULTRASOUND AND FLUOROSCOPIC GUIDANCE

MEDICATIONS:
2.0 g Ancef. The antibiotic was given in an appropriate time
interval prior to skin puncture.

ANESTHESIA/SEDATION:
Moderate (conscious) sedation was employed during this procedure. A
total of Versed 1.0 mg and Fentanyl 75 mcg was administered
intravenously.

Moderate Sedation Time: 17 minutes. The patient's level of
consciousness and vital signs were monitored continuously by
radiology nursing throughout the procedure under my direct
supervision.

FLUOROSCOPY TIME:  Fluoroscopy Time: 0 minutes 12 seconds (0.5 mGy).

COMPLICATIONS:
None



After creating a small venotomy incision, a micropuncture kit was
utilized to access the right internal jugular vein under direct,
real-time ultrasound guidance after the overlying soft tissues were
anesthetized with 1% lidocaine with epinephrine. Ultrasound image
documentation was performed. The microwire was marked to measure
appropriate internal catheter length. External tunneled length was
estimated. A total tip to cuff length of 19 cm was selected.

Skin and subcutaneous tissues of chest wall below the clavicle were
generously infiltrated with 1% lidocaine for local anesthesia. A
small stab incision was made with 11 blade scalpel. The selected
hemodialysis catheter was tunneled in a retrograde fashion from the
anterior chest wall to the venotomy incision.

A guidewire was advanced to the level of the IVC and the
micropuncture sheath was exchanged for a peel-away sheath.

The catheter was then placed through the peel-away sheath with tips
ultimately positioned within the superior aspect of the right
atrium. Final catheter positioning was confirmed and documented with
a spot radiographic image. The catheter aspirates and flushes
normally. The catheter was flushed with appropriate volume heparin
dwells.

The catheter exit site was secured with a 0-Prolene retention
suture. The venotomy incision was closed Derma bond and sterile
dressing. Dressings were applied at the chest wall.

Patient tolerated the procedure well and remained hemodynamically
stable throughout.

No complications were encountered and no significant blood loss
encountered.
IMPRESSION: Status post right IJ tunneled hemodialysis catheter placement.
Catheter ready for use.

## 2020-06-30 ENCOUNTER — Ambulatory Visit (INDEPENDENT_AMBULATORY_CARE_PROVIDER_SITE_OTHER): Payer: Medicare Other | Admitting: Medical-Surgical

## 2020-06-30 DIAGNOSIS — Z0000 Encounter for general adult medical examination without abnormal findings: Secondary | ICD-10-CM

## 2020-06-30 DIAGNOSIS — Z1231 Encounter for screening mammogram for malignant neoplasm of breast: Secondary | ICD-10-CM | POA: Diagnosis not present

## 2020-06-30 NOTE — Patient Instructions (Signed)
[facility] Maintenance Summary and Written Plan of Care  Ms. Sara Meza ,  Thank you for allowing me to perform your Medicare Annual Wellness Visit and for your ongoing commitment to your health.   Health Maintenance & Immunization History Health Maintenance  Topic Date Due   COVID-19 Vaccine (2 - Pfizer risk series) 07/16/2020 (Originally 09/27/2019)   Zoster Vaccines- Shingrix (1 of 2) 09/30/2020 (Originally 06/06/1992)   TETANUS/TDAP  11/20/2020 (Originally 06/06/1992)   Pneumococcal Vaccine 71-38 Years old (1 - PCV) 06/30/2021 (Originally 06/07/1979)   INFLUENZA VACCINE  08/18/2020   MAMMOGRAM  10/31/2020   PAP SMEAR-Modifier  06/28/2021   COLONOSCOPY (Pts 45-36yrs Insurance coverage will need to be confirmed)  05/03/2022   Hepatitis C Screening  Completed   HIV Screening  Completed   HPV VACCINES  Aged Out   Immunization History  Administered Date(s) Administered   Influenza Inj Mdck Quad Pf 11/02/2018   Influenza,inj,Quad PF,6+ Mos 11/02/2018   Influenza-Unspecified 12/02/2014   PFIZER Comirnaty(Gray Top)Covid-19 Tri-Sucrose Vaccine 09/06/2019   PFIZER(Purple Top)SARS-COV-2 Vaccination 09/06/2019   Pneumococcal Conjugate-13 05/10/2018   Pneumococcal Polysaccharide-23 12/22/2018    These are the patient goals that we discussed:  Goals Addressed               This Visit's Progress     Patient Stated (pt-stated)        06/30/2020 AWV Goal: Exercise for General Health  Patient will verbalize understanding of the benefits of increased physical activity: Exercising regularly is important. It will improve your overall fitness, flexibility, and endurance. Regular exercise also will improve your overall health. It can help you control your weight, reduce stress, and improve your bone density. Over the next year, patient will increase physical activity as tolerated with a goal of at least 150 minutes of moderate physical activity per week.  You can tell  that you are exercising at a moderate intensity if your heart starts beating faster and you start breathing faster but can still hold a conversation. Moderate-intensity exercise ideas include: Walking 1 mile (1.6 km) in about 15 minutes Biking Hiking Golfing Dancing Water aerobics Patient will verbalize understanding of everyday activities that increase physical activity by providing examples like the following: Yard work, such as: Sales promotion account executive Gardening Washing windows or floors Patient will be able to explain general safety guidelines for exercising:  Before you start a new exercise program, talk with your health care provider. Do not exercise so much that you hurt yourself, feel dizzy, or get very short of breath. Wear comfortable clothes and wear shoes with good support. Drink plenty of water while you exercise to prevent dehydration or heat stroke. Work out until your breathing and your heartbeat get faster.           This is a list of Health Maintenance Items that are overdue or due now: Pneumococcal vaccine  Td vaccine Screening mammography  Orders/Referrals Placed Today: Orders Placed This Encounter  Procedures   Mammogram 3D SCREEN BREAST BILATERAL    Standing Status:   Future    Standing Expiration Date:   06/30/2021    Scheduling Instructions:     Please call patient to schedule    Order Specific Question:   Reason for Exam (SYMPTOM  OR DIAGNOSIS REQUIRED)    Answer:   breast cancer screening    Order Specific Question:   Is the patient pregnant?  Answer:   No    Order Specific Question:   Preferred imaging location?    Answer:   [facility]   (Contact our referral department at [phone] if you have not spoken with someone about your referral appointment within the next 5 days)    Follow-up Plan Follow-up with Samuel Bouche, NP as planned Schedule your  tetanus vaccine at your pharmacy.  Confirm with your kidney doctor about the pneumonia vaccine. Mammogram referral has been sent.  Medicare wellness visit In one year. AVS printed and mailed.

## 2020-06-30 NOTE — Progress Notes (Signed)
MEDICARE ANNUAL WELLNESS VISIT  06/30/2020  Telephone Visit Disclaimer This Medicare AWV was conducted by telephone due to national recommendations for restrictions regarding the COVID-19 Pandemic (e.g. social distancing).  I verified, using two identifiers, that I am speaking with Sara Meza or their authorized healthcare agent. I discussed the limitations, risks, security, and privacy concerns of performing an evaluation and management service by telephone and the potential availability of an in-person appointment in the future. The patient expressed understanding and agreed to proceed.  Location of Patient: Home Location of Provider (nurse):  In the office.  Subjective:    Sara Meza is a 45 y.o. female patient of Samuel Bouche, NP who had a Medicare Annual Wellness Visit today via telephone. 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 she has 3 children. she reports that she is socially active and does interact with friends/family regularly. she is minimally physically active and enjoys cleaning her house.  Patient Care Team: Samuel Bouche, NP as PCP - General (Nurse Practitioner) Silverio Decamp, MD as Consulting Physician (Sports Medicine) Elza Rafter, MD as Consulting Physician (Family Medicine) Dwana Melena, MD as Consulting Physician (Nephrology)  Advanced Directives 06/30/2020 04/27/2019 09/13/2018 09/12/2018 05/15/2018 05/15/2018 02/22/2018  Does Patient Have a Medical Advance Directive? Yes Yes Yes Yes No No No  Type of Advance Directive Living will;Healthcare Power of [place]  Does patient want to make changes to medical advance directive? No - Patient declined - No - Patient declined No - Patient declined - - -  Copy of [place] in Chart? No - copy requested No - copy requested No - copy requested No - copy requested - - -  Would patient  like information on creating a medical advance directive? - No - Patient declined - - No - Patient declined - No - Patient declined    Hospital Utilization Over the Past 12 Months: # of hospitalizations or ER visits: 2 # of surgeries: 1  Review of Systems    Patient reports that her overall health is better compared to last year.  History obtained from chart review and the patient  Patient Reported Readings (BP, Pulse, CBG, Weight, etc) none  Pain Assessment Pain : No/denies pain     Current Medications & Allergies (verified) Allergies as of 06/30/2020       Reactions   Azithromycin Rash   Itching, LE skin rash   Camellia Other (See Comments)   Irregular menses   Temazepam Hives   Lisinopril Other (See Comments)   irregular menses        Medication List        Accurate as of June 30, 2020 11:08 AM. If you have any questions, ask your nurse or doctor.          acetaminophen 500 MG tablet Commonly known as: TYLENOL Take 500-1,000 mg by mouth every 6 (six) hours as needed.   cinacalcet 30 MG tablet Commonly known as: SENSIPAR Take 30 mg by mouth 2 (two) times daily.   DIALYVITE 800 WITH ZINC 0.8 MG Tabs Take 1 tablet by mouth daily.   DOXERCALCIFEROL PO Doxercalciferol (Hectorol)   gabapentin 100 MG capsule Commonly known as: NEURONTIN Take 1 capsule by mouth at bedtime   heparin 1000 unit/ml Soln injection Heparin Sodium (Porcine) 1,000 Units/mL Systemic   lidocaine-prilocaine cream Commonly known as: EMLA APPLY SMALL AMOUNT TO ACCESS  SITE (AVF) 30 MINUTES BEFORE DIALYSIS. COVER WITH OCCLUSIVE DRESSING (SARAN WRAP)   sevelamer carbonate 800 MG tablet Commonly known as: RENVELA Take by mouth.   Velphoro 500 MG chewable tablet Generic drug: sucroferric oxyhydroxide Chew 500 mg by mouth 3 (three) times daily.        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 Currently does dialysis every day.    CKD (chronic kidney disease), stage V ([place]) 02/23/2018   Dialysis patient [facility])    Diarrhea due to drug 07/24/2018   GERD (gastroesophageal reflux disease)    Hemolacria    History of gestational diabetes 06/29/2012   Hypertension    Past Surgical History:  Procedure Laterality Date   AV FISTULA PLACEMENT Right 03/02/2018   Procedure: ARTERIOVENOUS (AV) FISTULA CREATION;  Surgeon: Waynetta Sandy, MD;  Location: [place];  Service: Vascular;  Laterality: Right;   ENDOVENOUS ABLATION SAPHENOUS VEIN W/ LASER     HYSTEROSCOPY WITH D & C N/A 09/13/2018   Procedure: DILATATION AND CURETTAGE WITH MINERVA ABLATION;  Surgeon: Emily Filbert, MD;  Location: [place];  Service: Gynecology;  Laterality: N/A;  Rep will be here confirmed on 09/06/18 CS   IR FLUORO GUIDE CV LINE RIGHT  02/24/2018   IR US GUIDE VASC ACCESS RIGHT  02/24/2018   LAPAROSCOPY     tenckhoff catheter removal  10/28/2019   TUBAL LIGATION     Family History  Problem Relation Age of Onset   Diabetes Father    Hypertension Father    Diabetes Mother    Hypertension Mother    Colon cancer Sister 59   Wilm's tumor Sister    Colon polyps Neg Hx    Esophageal cancer Neg Hx    Rectal cancer Neg Hx    Stomach cancer Neg Hx    Social History   Socioeconomic History   Marital status: Married    Spouse name: [name]   Number of children: 3   Years of education: 12   Highest education level: 12th grade  Occupational History   Occupation: homemaker    Comment: disabled  Tobacco Use   Smoking status: Never   Smokeless tobacco: Never  Vaping Use   Vaping Use: Never used  Substance and Sexual Activity   Alcohol use: Not Currently   Drug use: Never   Sexual activity: Yes    Birth control/protection: Surgical, None  Other Topics Concern   Not on file  Social History Narrative   Lives with her husband and one of her children. She likes to clean her  house in her free time.   Social Determinants of Health   Financial Resource Strain: Low Risk    Difficulty of Paying Living Expenses: Not hard at all  Food Insecurity: No Food Insecurity   Worried About Charity fundraiser in the Last Year: Never true   [place] in the Last Year: Never true  Transportation Needs: No Transportation Needs   Lack of Transportation (Medical): No   Lack of Transportation (Non-Medical): No  Physical Activity: Inactive   Days of Exercise per Week: 0 days   Minutes of Exercise per Session: 0 min  Stress: No Stress Concern Present   Feeling of Stress : Not at all  Social Connections: Socially Isolated   Frequency of Communication with Friends and Family: Never   Frequency of  Social Gatherings with Friends and Family: Never   Attends Religious Services: Never   Marine scientist or Organizations: No   Attends Archivist Meetings: Never   Marital Status: Married    Activities of Daily Living In your present state of health, do you have any difficulty performing the following activities: 06/30/2020 09/28/2019  Hearing? N N  Vision? N N  Difficulty concentrating or making decisions? N N  Walking or climbing stairs? N N  Dressing or bathing? N N  Doing errands, shopping? N N  Preparing Food and eating ? N -  Using the Toilet? N -  In the past six months, have you accidently leaked urine? N -  Do you have problems with loss of bowel control? N -  Managing your Medications? N -  Managing your Finances? N -  Housekeeping or managing your Housekeeping? N -  Some recent data might be hidden    Patient Education/ Literacy How often do you need to have someone help you when you read instructions, pamphlets, or other written materials from your doctor or pharmacy?: 1 - Never What is the last grade level you completed in school?: 12th grade.  Exercise Current Exercise Habits: Home exercise routine, Type of exercise: walking, Time  (Minutes): 30, Frequency (Times/Week): 7, Weekly Exercise (Minutes/Week): 210, Intensity: Mild, Exercise limited by: None identified  Diet Patient reports consuming  2-3  meals a day and 1-2 snack(s) a day Patient reports that her primary diet is: Regular ; fluid restrictions. Patient reports that she does have regular access to food.   Depression Screen PHQ 2/9 Scores 06/30/2020 02/21/2018  PHQ - 2 Score 0 0  PHQ- 9 Score - 3     Fall Risk Fall Risk  06/30/2020  Falls in the past year? 0  Number falls in past yr: 0  Injury with Fall? 0  Risk for fall due to : No Fall Risks  Follow up Falls evaluation completed     Objective:  Sara Meza seemed alert and oriented and she participated appropriately during our telephone visit.  Blood Pressure Weight BMI  BP Readings from Last 3 Encounters:  11/21/19 118/64  11/21/19 135/85  11/02/19 (!) 159/89   Wt Readings from Last 3 Encounters:  11/21/19 134 lb (60.8 kg)  11/21/19 134 lb 6.4 oz (61 kg)  11/02/19 136 lb 4.8 oz (61.8 kg)   BMI Readings from Last 1 Encounters:  11/21/19 20.99 kg/m    *Unable to obtain current vital signs, weight, and BMI due to telephone visit type  Hearing/Vision  Sicilia did not seem to have difficulty with hearing/understanding during the telephone conversation Reports that she has not had a formal eye exam by an eye care professional within the past year Reports that she has not had a formal hearing evaluation within the past year *Unable to fully assess hearing and vision during telephone visit type  Cognitive Function: 6CIT Screen 06/30/2020  What Year? 0 points  What month? 0 points  What time? 0 points  Count back from 20 0 points  Months in reverse 0 points  Repeat phrase 0 points  Total Score 0   (Normal:0-7, Significant for Dysfunction: >8)  Normal Cognitive Function Screening: Yes   Immunization & Health Maintenance Record Immunization History  Administered Date(s) Administered    Influenza Inj Mdck Quad Pf 11/02/2018   Influenza,inj,Quad PF,6+ Mos 11/02/2018   Influenza-Unspecified 12/02/2014   PFIZER Comirnaty(Gray Top)Covid-19 Tri-Sucrose Vaccine 09/06/2019   PFIZER(Purple Top)SARS-COV-2  Vaccination 09/06/2019   Pneumococcal Conjugate-13 05/10/2018   Pneumococcal Polysaccharide-23 12/22/2018    Health Maintenance  Topic Date Due   COVID-19 Vaccine (2 - Pfizer risk series) 07/16/2020 (Originally 09/27/2019)   Zoster Vaccines- Shingrix (1 of 2) 09/30/2020 (Originally 06/06/1992)   TETANUS/TDAP  11/20/2020 (Originally 06/06/1992)   Pneumococcal Vaccine 52-8 Years old (1 - PCV) 06/30/2021 (Originally 06/07/1979)   INFLUENZA VACCINE  08/18/2020   MAMMOGRAM  10/31/2020   PAP SMEAR-Modifier  06/28/2021   COLONOSCOPY (Pts 45-62yrs Insurance coverage will need to be confirmed)  05/03/2022   Hepatitis C Screening  Completed   HIV Screening  Completed   HPV VACCINES  Aged Out       Assessment  This is a routine wellness examination for [employer].  Health Maintenance: Due or Overdue There are no preventive care reminders to display for this patient.   Sara Meza does not need a referral for Community Assistance: Care Management:   no Social Work:    no Prescription Assistance:  no Nutrition/Diabetes Education:  no   Plan:  Personalized Goals  Goals Addressed               This Visit's Progress     Patient Stated (pt-stated)        06/30/2020 AWV Goal: Exercise for General Health  Patient will verbalize understanding of the benefits of increased physical activity: Exercising regularly is important. It will improve your overall fitness, flexibility, and endurance. Regular exercise also will improve your overall health. It can help you control your weight, reduce stress, and improve your bone density. Over the next year, patient will increase physical activity as tolerated with a goal of at least 150 minutes of moderate physical activity per week.   You can tell that you are exercising at a moderate intensity if your heart starts beating faster and you start breathing faster but can still hold a conversation. Moderate-intensity exercise ideas include: Walking 1 mile (1.6 km) in about 15 minutes Biking Hiking Golfing Dancing Water aerobics Patient will verbalize understanding of everyday activities that increase physical activity by providing examples like the following: Yard work, such as: Sales promotion account executive Gardening Washing windows or floors Patient will be able to explain general safety guidelines for exercising:  Before you start a new exercise program, talk with your health care provider. Do not exercise so much that you hurt yourself, feel dizzy, or get very short of breath. Wear comfortable clothes and wear shoes with good support. Drink plenty of water while you exercise to prevent dehydration or heat stroke. Work out until your breathing and your heartbeat get faster.         Personalized Health Maintenance & Screening Recommendations  Pneumococcal vaccine  Td vaccine Screening mammography  Lung Cancer Screening Recommended: no (Low Dose CT Chest recommended if Age 27-80 years, 30 pack-year currently smoking OR have quit w/in past 15 years) Hepatitis C Screening recommended: no HIV Screening recommended: no  Advanced Directives: Written information was not prepared per patient's request.  Referrals & Orders Orders Placed This Encounter  Procedures   Mammogram 3D SCREEN BREAST BILATERAL     Follow-up Plan Follow-up with Samuel Bouche, NP as planned Schedule your tetanus vaccine at your pharmacy.  Confirm with your kidney doctor about the pneumonia vaccine. Mammogram referral has been sent.  Medicare wellness visit In one year. AVS printed and mailed.   I have personally  reviewed and noted the following in the patient's  chart:   Medical and social history Use of alcohol, tobacco or illicit drugs  Current medications and supplements Functional ability and status Nutritional status Physical activity Advanced directives List of other physicians Hospitalizations, surgeries, and ER visits in previous 12 months Vitals Screenings to include cognitive, depression, and falls Referrals and appointments  In addition, I have reviewed and discussed with Sara Meza certain preventive protocols, quality metrics, and best practice recommendations. A written personalized care plan for preventive services as well as general preventive health recommendations is available and can be mailed to the patient at her request.      Tinnie Gens, RN  06/30/2020

## 2020-07-02 ENCOUNTER — Ambulatory Visit (HOSPITAL_COMMUNITY)
Admission: RE | Admit: 2020-07-02 | Discharge: 2020-07-02 | Disposition: A | Discharge status: Home / Self Care | Payer: Medicare Other | Source: Ambulatory Visit | Attending: Vascular Surgery | Admitting: Vascular Surgery

## 2020-07-02 ENCOUNTER — Other Ambulatory Visit: Payer: Self-pay

## 2020-07-02 ENCOUNTER — Ambulatory Visit (INDEPENDENT_AMBULATORY_CARE_PROVIDER_SITE_OTHER): Payer: Medicare Other | Admitting: Physician Assistant

## 2020-07-02 VITALS — BP 146/93 | HR 85 | Temp 97.9°F | Resp 14 | Ht 66.0 in | Wt 149.0 lb

## 2020-07-02 DIAGNOSIS — N186 End stage renal disease: Secondary | ICD-10-CM

## 2020-07-02 DIAGNOSIS — Z992 Dependence on renal dialysis: Secondary | ICD-10-CM | POA: Diagnosis present

## 2020-07-02 NOTE — Progress Notes (Signed)
Established Dialysis Access   History of Present Illness   Sara Meza is a 45 y.o. (1975/05/13) female who presents for evaluation of pain in right upper extremity. She has a right brachiocephalic AV fistula that was placed by Dr. Donzetta Matters on 03/02/18. 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 She explains that sometimes with sticks "the tech feels something inside". Does occasionally have oozing bleeding while at dialysis and after. Also reports occasional sharp stabbing pain that lasts up to 3 minutes. Intermittent numbness or tingling in right hand. Denies any pain in right forearm or hand. No coldness or weakness. Usually occurs when she is not at dialysis. Otherwise she says the fistula is functioning well. She dost not have any steal symptoms.  She dialyzes on Tues/ Thurs/ Saturday in [facility]   Current Outpatient Medications  Medication Sig Dispense Refill   acetaminophen (TYLENOL) 500 MG tablet Take 500-1,000 mg by mouth every 6 (six) hours as needed.     B Complex-C-Zn-Folic Acid (DIALYVITE 098 WITH ZINC) 0.8 MG TABS Take 1 tablet by mouth daily.     cinacalcet (SENSIPAR) 30 MG tablet Take 30 mg by mouth 2 (two) times daily.     DOXERCALCIFEROL PO Doxercalciferol (Hectorol) (Patient not taking: Reported on 06/30/2020)     gabapentin (NEURONTIN) 100 MG capsule Take 1 capsule by mouth at bedtime 90 capsule 1   heparin 1000 unit/ml SOLN injection Heparin Sodium (Porcine) 1,000 Units/mL Systemic     lidocaine-prilocaine (EMLA) cream APPLY SMALL AMOUNT TO ACCESS SITE (AVF) 30 MINUTES BEFORE DIALYSIS. COVER WITH OCCLUSIVE DRESSING (SARAN WRAP)     sevelamer carbonate (RENVELA) 800 MG tablet Take by mouth. (Patient not taking: Reported on 06/30/2020)     VELPHORO 500 MG chewable tablet Chew 500 mg by mouth 3 (three) times daily.     No current facility-administered medications for this visit.    On ROS today: negative unless stated in  HPI   Physical Examination   Vitals:   07/02/20 1024  BP: (!) 146/93  Pulse: 85  Resp: 14  Temp: 97.9 F (36.6 C)  TempSrc: Temporal  SpO2: 100%  Weight: 149 lb (67.6 kg)  Height: 5\' 6"  (1.676 m)   Body mass index is 24.05 kg/m.  General Alert, O x 3, WD, NAD  Pulmonary Non labored  Cardiac Regular rate and rhythm   Vascular Vessel Right Left  Radial Palpable Palpable  Brachial Palpable Palpable  Ulnar Not palpable Not palpable    Musculo- skeletal M/S 5/5 throughout  , Extremities without ischemic changes    Neurologic A&O; CN grossly intact    Right upper extremity BC fistula as shown above. Diffusely aneurysmal and very tortuous proximally. Has excellent thrill and good bruit. No pulsatility.The skin overlying the aneurysmal areas is not hypopigmented and is easily mobilized. No ulceration  Non-invasive Vascular Imaging   right Arm Access Duplex  (07/02/20):     Findings:  +--------------------+----------+-----------------+--------+  AVF                 PSV (cm/s)Flow Vol (mL/min)Comments  +--------------------+----------+-----------------+--------+  Native artery inflow   351          4537                 +--------------------+----------+-----------------+--------+  AVF Anastomosis        892                               +--------------------+----------+-----------------+--------+      +------------+----------+------------+----------+--------------------------  ----+  OUTFLOW VEINPSV (cm/s)  Diameter  Depth (cm)           Describe                                        (cm)                                                +------------+----------+------------+----------+--------------------------  ----+  Shoulder       156        0.93       0.63                                     +------------+----------+------------+----------+--------------------------  ----+  Prox UA        166        1.05       0.53           competing branch          +------------+----------+------------+----------+--------------------------  ----+  Mid UA         102        1.93       0.25        competing branch and                                                              aneurysmal              +------------+----------+------------+----------+--------------------------  ----+  Dist UA        414        1.54       0.16             aneurysmal              +------------+----------+------------+----------+--------------------------  ----+  AC Fossa       629        0.82       0.22                                     +------------+----------+------------+----------+--------------------------  ----+   Medical Decision Making   Sara Meza is a 45 y.o. female who presents with aneurysmal dilatation of right AV fistula with some intermittent pains. 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 The fistula is functioning well per patient. On duplex the fistula is patent but also aneurysmal and also some competing branches present. Reassured her that I do not feel that she is at high risk for bleeding with no ulceration or thinning of the skin.  To revise and plicate her aneurysms we discussed that she would likely need temporary catheter as there would not been adequate length to uses remainder of fistula to continue to cannulate. She does not want a TDC if she can avoid it. We discussed continuing to monitor it to see if this worsens prior to any intervention. She will likely benefit from Fistulogram prior to  surgical revision to evaluate for any stenosis. She will call for follow up if she has worsening symptoms or bleeding.  - Recommended that techs at dialysis move around cannulation sites to avoid sticking in the same location - She will follow up as needed if she has increased pain, bleeding, malfunction of fistula, or increase in size of aneurysmal areas  Karoline Caldwell, PA-C [facility] Office: [phone]  Clinic MD: Oneida Alar

## 2020-09-17 DIAGNOSIS — I340 Nonrheumatic mitral (valve) insufficiency: Secondary | ICD-10-CM | POA: Insufficient documentation

## 2020-09-24 ENCOUNTER — Ambulatory Visit (INDEPENDENT_AMBULATORY_CARE_PROVIDER_SITE_OTHER): Payer: Medicare Other | Admitting: Medical-Surgical

## 2020-09-24 ENCOUNTER — Encounter: Payer: Self-pay | Admitting: Medical-Surgical

## 2020-09-24 VITALS — BP 156/85 | HR 73 | Resp 20 | Ht 66.0 in | Wt 140.2 lb

## 2020-09-24 DIAGNOSIS — Z131 Encounter for screening for diabetes mellitus: Secondary | ICD-10-CM | POA: Diagnosis not present

## 2020-09-24 DIAGNOSIS — Z1329 Encounter for screening for other suspected endocrine disorder: Secondary | ICD-10-CM

## 2020-09-24 DIAGNOSIS — Z0000 Encounter for general adult medical examination without abnormal findings: Secondary | ICD-10-CM

## 2020-09-24 DIAGNOSIS — Z8632 Personal history of gestational diabetes: Secondary | ICD-10-CM

## 2020-09-24 NOTE — Progress Notes (Signed)
HPI: Sara Meza is a 45 y.o. female who  has a past medical history of Anemia, Chronic kidney disease (02/22/2018), CKD (chronic kidney disease), stage V ([place]) (02/23/2018), Dialysis patient [facility]), Diarrhea due to drug (07/24/2018), GERD (gastroesophageal reflux disease), Hemolacria, History of gestational diabetes (06/29/2012), Hypertension, and Rash and nonspecific skin eruption (02/22/2018).  she presents to [facility] today, 09/24/20,  for chief complaint of: Annual physical exam  Dentist: Overdue, no dental insurance Eye exam: Done last year, wears reading glasses Exercise: None intentional Diet: Follows renal diet as recommended Pap smear: 2 years ago, normal Mammogram: Ordered Colon cancer screening: Done April 2021 COVID vaccine: Up-to-date  Concerns: None  Past medical, surgical, social and family history reviewed:  Patient Active Problem List   Diagnosis Date Noted   Rectal prolapse 11/21/2019   Early satiety 07/24/2018   Dyspepsia 07/24/2018   Vitamin D deficiency 03/08/2018   ESRD (end stage renal disease) on dialysis ([place]) 03/08/2018   Anemia of chronic disease 03/08/2018   Secondary hyperparathyroidism of renal origin ([place]) 03/08/2018   Family history of colon cancer requiring screening colonoscopy 02/22/2018   History of varicose veins of lower extremity 02/22/2018   Peripheral edema 02/22/2018   Iron deficiency anemia due to chronic blood loss 02/22/2018   Anemia in ESRD (end-stage renal disease) ([place]) 02/22/2018   Menorrhagia with regular cycle 02/22/2018   Hypertension goal BP (blood pressure) < 130/80 02/21/2018   Iron deficiency 02/21/2018   Status post tubal ligation 02/21/2018   Hypertensive retinopathy 02/21/2018   Essential hypertension 12/20/2014   Left ankle pain 08/25/2012   History of gestational diabetes 06/29/2012   Patella-femoral syndrome 06/29/2012   Bilateral plantar fasciitis 06/29/2012   Restless legs  06/29/2012    Past Surgical History:  Procedure Laterality Date   AV FISTULA PLACEMENT Right 03/02/2018   Procedure: ARTERIOVENOUS (AV) FISTULA CREATION;  Surgeon: Waynetta Sandy, MD;  Location: [place];  Service: Vascular;  Laterality: Right;   ENDOVENOUS ABLATION SAPHENOUS VEIN W/ LASER     HYSTEROSCOPY WITH D & C N/A 09/13/2018   Procedure: DILATATION AND CURETTAGE WITH MINERVA ABLATION;  Surgeon: Emily Filbert, MD;  Location: [place];  Service: Gynecology;  Laterality: N/A;  Rep will be here confirmed on 09/06/18 CS   IR FLUORO GUIDE CV LINE RIGHT  02/24/2018   IR US GUIDE VASC ACCESS RIGHT  02/24/2018   LAPAROSCOPY     tenckhoff catheter removal  10/28/2019   TUBAL LIGATION      Social History   Tobacco Use   Smoking status: Never   Smokeless tobacco: Never  Substance Use Topics   Alcohol use: Not Currently    Family History  Problem Relation Age of Onset   Diabetes Father    Hypertension Father    Diabetes Mother    Hypertension Mother    Colon cancer Sister 83   Wilm's tumor Sister    Colon polyps Neg Hx    Esophageal cancer Neg Hx    Rectal cancer Neg Hx    Stomach cancer Neg Hx      Current medication list and allergy/intolerance information reviewed:    Current Outpatient Medications  Medication Sig Dispense Refill   acetaminophen (TYLENOL) 500 MG tablet Take 500-1,000 mg by mouth every 6 (six) hours as needed.     ASPIRIN LOW DOSE 81 MG EC tablet Take 81 mg by mouth daily.     atorvastatin (LIPITOR) 40 MG tablet Take 40 mg  by mouth daily as needed.     B Complex-C-Zn-Folic Acid (DIALYVITE 491 WITH ZINC) 0.8 MG TABS Take 1 tablet by mouth daily.     cinacalcet (SENSIPAR) 30 MG tablet Take 30 mg by mouth 2 (two) times daily.     heparin 1000 unit/ml SOLN injection Heparin Sodium (Porcine) 1,000 Units/mL Systemic     metoprolol succinate (TOPROL-XL) 25 MG 24 hr tablet Take 25 mg by mouth daily.     VELPHORO 500 MG chewable tablet Chew 500 mg by mouth 3  (three) times daily.     No current facility-administered medications for this visit.    Allergies  Allergen Reactions   Green Tea Leaf Ext Itching and Other (See Comments)    Irregular menses    Azithromycin Rash    Itching, LE skin rash    Camellia Other (See Comments)    Irregular menses   Temazepam Hives   Lisinopril Other (See Comments)    irregular menses      Review of Systems: Constitutional:  No  fever, no chills, No recent illness, No unintentional weight changes. No significant fatigue.  HEENT: No  headache, no vision change, no hearing change, No sore throat, No  sinus pressure Cardiac: No  chest pain, No  pressure, No palpitations, No  Orthopnea Respiratory:  No  shortness of breath. No  Cough Gastrointestinal: No  abdominal pain, No  nausea, No  vomiting,  No  blood in stool, No  diarrhea, No  constipation  Musculoskeletal: No new myalgia/arthralgia Skin: No  Rash, No other wounds/concerning lesions Genitourinary: No  incontinence, No  abnormal genital bleeding, No abnormal genital discharge Hem/Onc: No  easy bruising/bleeding, No  abnormal lymph node Endocrine: No cold intolerance,  No heat intolerance. No polyuria/polydipsia/polyphagia  Neurologic: No  weakness, No  dizziness, No  slurred speech/focal weakness/facial droop Psychiatric: + Concerns with depression, + concerns with anxiety, No sleep problems, No mood problems  Exam:  BP (!) 156/85 (BP Location: Left Arm, Patient Position: Sitting, Cuff Size: Normal)   Pulse 73   Resp 20   Ht $R'5\' 6"'OX$  (1.676 m)   Wt 140 lb 3.2 oz (63.6 kg)   SpO2 97%   BMI 22.63 kg/m  Constitutional: VS see above. General Appearance: alert, well-developed, well-nourished, NAD Eyes: Normal lids and conjunctive, non-icteric sclera Ears, Nose, Mouth, Throat: MMM, Normal external inspection ears/nares/mouth/lips/gums. TM normal bilaterally.   Neck: No masses, trachea midline. No thyroid enlargement. No tenderness/mass appreciated.  No lymphadenopathy Respiratory: Normal respiratory effort. no wheeze, no rhonchi, no rales Cardiovascular: S1/S2 normal, no murmur, no rub/gallop auscultated. RRR. No lower extremity edema. No carotid bruit or JVD. No abdominal aortic bruit.  Right upper arm AV fistula positive for thrill and bruit. Gastrointestinal: Nontender, no masses. No hepatomegaly, no splenomegaly. No hernia appreciated. Bowel sounds normal. Rectal exam deferred.  Musculoskeletal: Gait normal. No clubbing/cyanosis of digits.  Neurological: Normal balance/coordination. No tremor. No cranial nerve deficit on limited exam. Motor and sensation intact and symmetric. Cerebellar reflexes intact.  Skin: warm, dry, intact. No rash/ulcer. No concerning nevi or subq nodules on limited exam.   Psychiatric: Normal judgment/insight. Normal mood and affect. Oriented x3.    ASSESSMENT/PLAN:   1. Annual physical exam Regular labs checked recently with renal transplant so no further preventative care lab work due today.  Recommend getting in with a dentist when possible to prevent dental issues from interfering with the transplant procedures.  2. Diabetes mellitus screening Check hemoglobin A1c. - Hemoglobin A1c  3. Thyroid disorder screen Checking TSH. - TSH  4. History of gestational diabetes Checking hemoglobin A1c. - Hemoglobin A1c  Orders Placed This Encounter  Procedures   TSH   Hemoglobin A1c    No orders of the defined types were placed in this encounter.   Patient Instructions  Preventive Care 22-57 Years Old, Female Preventive care refers to lifestyle choices and visits with your health care provider that can promote health and wellness. This includes: A yearly physical exam. This is also called an annual wellness visit. Regular dental and eye exams. Immunizations. Screening for certain conditions. Healthy lifestyle choices, such as: Eating a healthy diet. Getting regular exercise. Not using drugs or  products that contain nicotine and tobacco. Limiting alcohol use. What can I expect for my preventive care visit? Physical exam Your health care provider will check your: Height and weight. These may be used to calculate your BMI (body mass index). BMI is a measurement that tells if you are at a healthy weight. Heart rate and blood pressure. Body temperature. Skin for abnormal spots. Counseling Your health care provider may ask you questions about your: Past medical problems. Family's medical history. Alcohol, tobacco, and drug use. Emotional well-being. Home life and relationship well-being. Sexual activity. Diet, exercise, and sleep habits. Work and work Statistician. Access to firearms. Method of birth control. Menstrual cycle. Pregnancy history. What immunizations do I need? Vaccines are usually given at various ages, according to a schedule. Your health care provider will recommend vaccines for you based on your age, medical history, and lifestyle or other factors, such as travel or where you work. What tests do I need? Blood tests Lipid and cholesterol levels. These may be checked every 5 years, or more often if you are over 1 years old. Hepatitis C test. Hepatitis B test. Screening Lung cancer screening. You may have this screening every year starting at age 68 if you have a 30-pack-year history of smoking and currently smoke or have quit within the past 15 years. Colorectal cancer screening. All adults should have this screening starting at age 22 and continuing until age 23. Your health care provider may recommend screening at age 15 if you are at increased risk. You will have tests every 1-10 years, depending on your results and the type of screening test. Diabetes screening. This is done by checking your blood sugar (glucose) after you have not eaten for a while (fasting). You may have this done every 1-3 years. Mammogram. This may be done every 1-2 years. Talk  with your health care provider about when you should start having regular mammograms. This may depend on whether you have a family history of breast cancer. BRCA-related cancer screening. This may be done if you have a family history of breast, ovarian, tubal, or peritoneal cancers. Pelvic exam and Pap test. This may be done every 3 years starting at age 64. Starting at age 70, this may be done every 5 years if you have a Pap test in combination with an HPV test. Other tests STD (sexually transmitted disease) testing, if you are at risk. Bone density scan. This is done to screen for osteoporosis. You may have this scan if you are at high risk for osteoporosis. Talk with your health care provider about your test results, treatment options, and if necessary, the need for more tests. Follow these instructions at home: Eating and drinking  Eat a diet that includes fresh fruits and vegetables, whole grains, lean protein, and low-fat dairy  products. Take vitamin and mineral supplements as recommended by your health care provider. Do not drink alcohol if: Your health care provider tells you not to drink. You are pregnant, may be pregnant, or are planning to become pregnant. If you drink alcohol: Limit how much you have to 0-1 drink a day. Be aware of how much alcohol is in your drink. In the [place] one drink equals one 12 oz bottle of beer (355 mL), one 5 oz glass of wine (148 mL), or one 1 oz glass of hard liquor (44 mL). Lifestyle Take daily care of your teeth and gums. Brush your teeth every morning and night with fluoride toothpaste. Floss one time each day. Stay active. Exercise for at least 30 minutes 5 or more days each week. Do not use any products that contain nicotine or tobacco, such as cigarettes, e-cigarettes, and chewing tobacco. If you need help quitting, ask your health care provider. Do not use drugs. If you are sexually active, practice safe sex. Use a condom or other form of  protection to prevent STIs (sexually transmitted infections). If you do not wish to become pregnant, use a form of birth control. If you plan to become pregnant, see your health care provider for a prepregnancy visit. If told by your health care provider, take low-dose aspirin daily starting at age 56. Find healthy ways to cope with stress, such as: Meditation, yoga, or listening to music. Journaling. Talking to a trusted person. Spending time with friends and family. Safety Always wear your seat belt while driving or riding in a vehicle. Do not drive: If you have been drinking alcohol. Do not ride with someone who has been drinking. When you are tired or distracted. While texting. Wear a helmet and other protective equipment during sports activities. If you have firearms in your house, make sure you follow all gun safety procedures. What's next? Visit your health care provider once a year for an annual wellness visit. Ask your health care provider how often you should have your eyes and teeth checked. Stay up to date on all vaccines. This information is not intended to replace advice given to you by your health care provider. Make sure you discuss any questions you have with your health care provider. Document Revised: 03/14/2020 Document Reviewed: 09/15/2017 Elsevier Patient Education  [place].  Follow-up plan: Return in about 1 year (around 09/24/2021) for annual physical exam or sooner if needed.  Clearnce Sorrel, DNP, APRN, FNP-BC [facility]

## 2020-09-24 NOTE — Patient Instructions (Signed)
Erroneous note

## 2020-09-25 LAB — TSH: TSH: 1.66 mIU/L

## 2020-09-25 LAB — HEMOGLOBIN A1C
Hgb A1c MFr Bld: 4.4 % of total Hgb (ref <5.7)
Mean Plasma Glucose: 80 mg/dL
eAG (mmol/L): 4.4 mmol/L

## 2020-10-26 IMAGING — US TRANSVAGINAL ULTRASOUND OF PELVIS
1 series · 14 of 25 positions shown · non-contrast
Comparison: None

CLINICAL DATA: Menorrhagia

EXAM:
ULTRASOUND PELVIS TRANSVAGINAL
TECHNIQUE: Transvaginal ultrasound examination of the pelvis was performed
including evaluation of the uterus, ovaries, adnexal regions, and
pelvic cul-de-sac.

[Series 1: transvaginal ultrasound of pelvis · 0.09mm/px · 86 acquisitions, 14 frames shown]
[im 1/86]
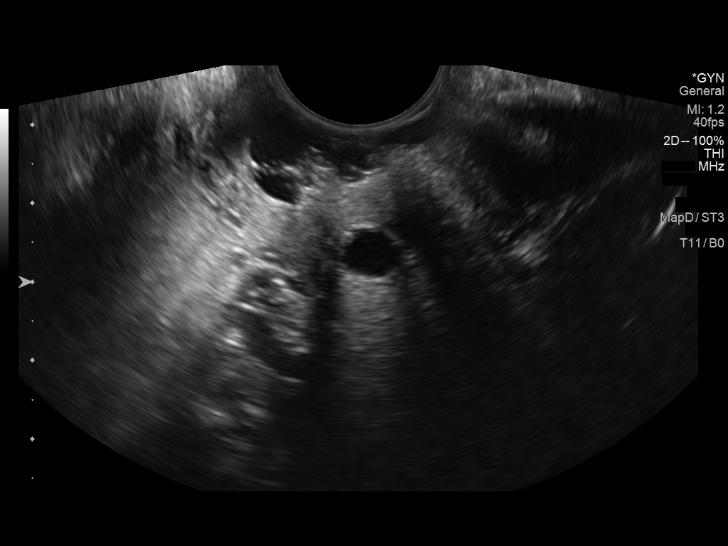
[im 8/86]
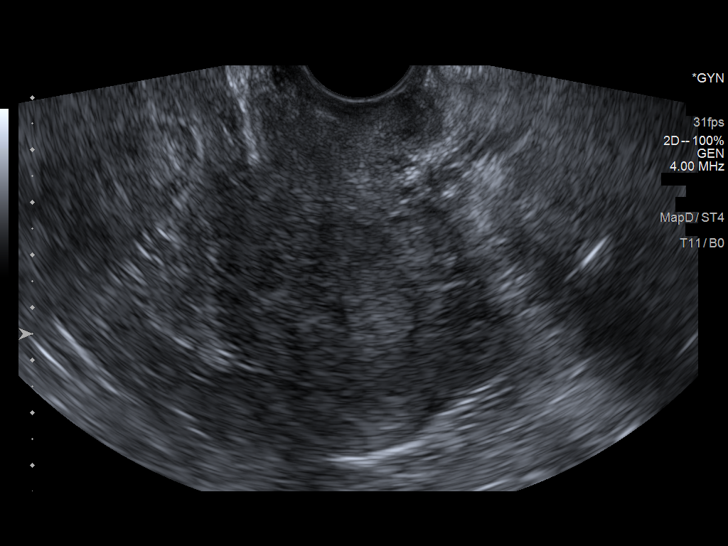
[im 15/86]
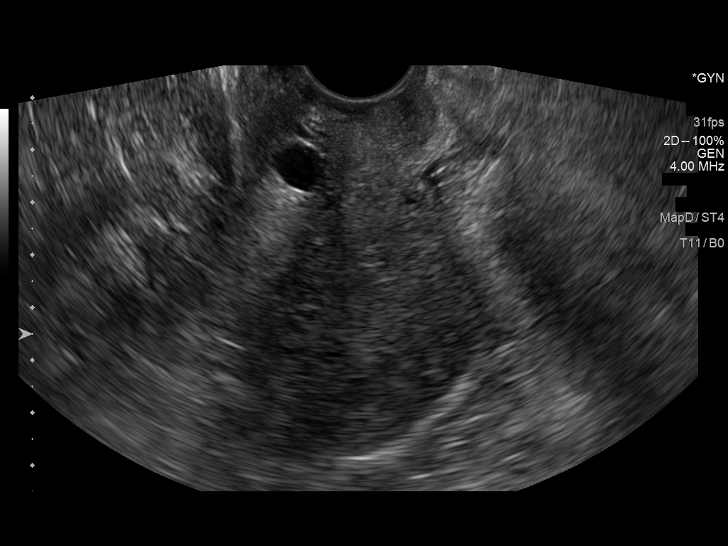
[im 22/86]
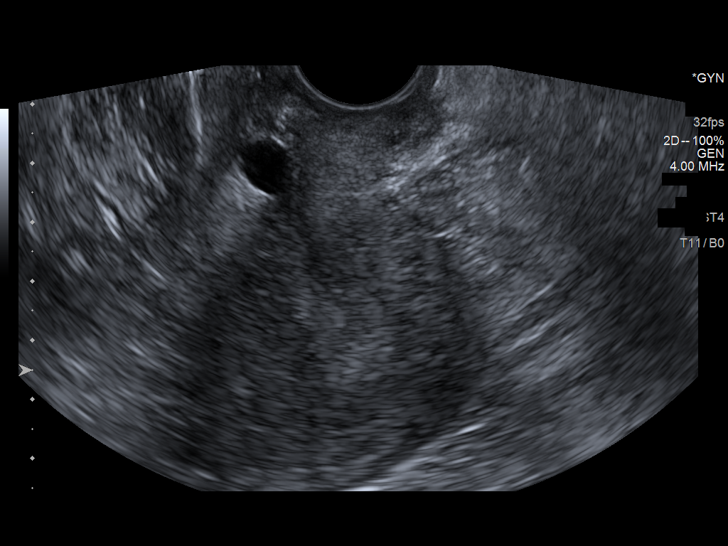
[im 29/86]
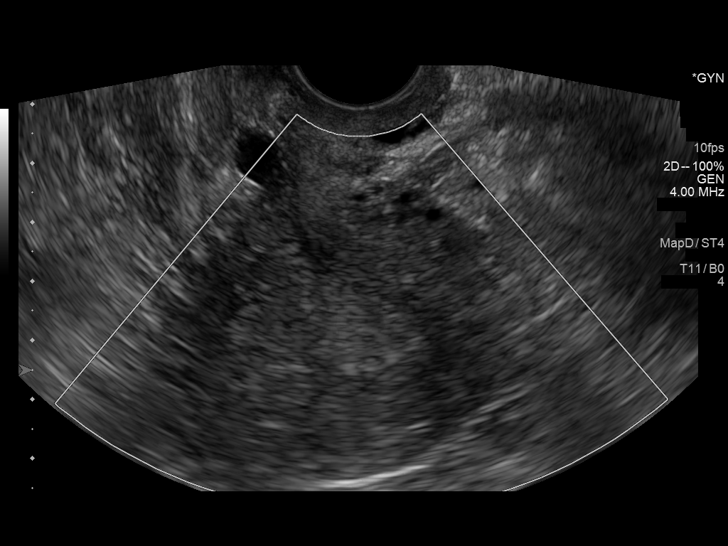
[im 32/86]
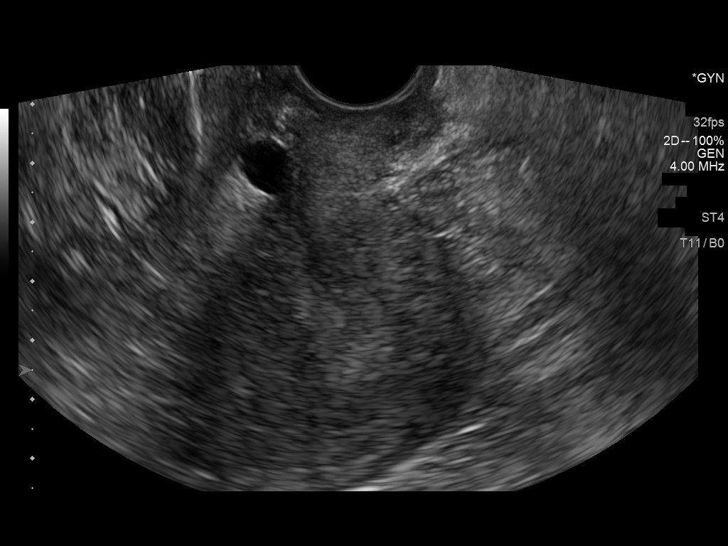
[im 39/86]
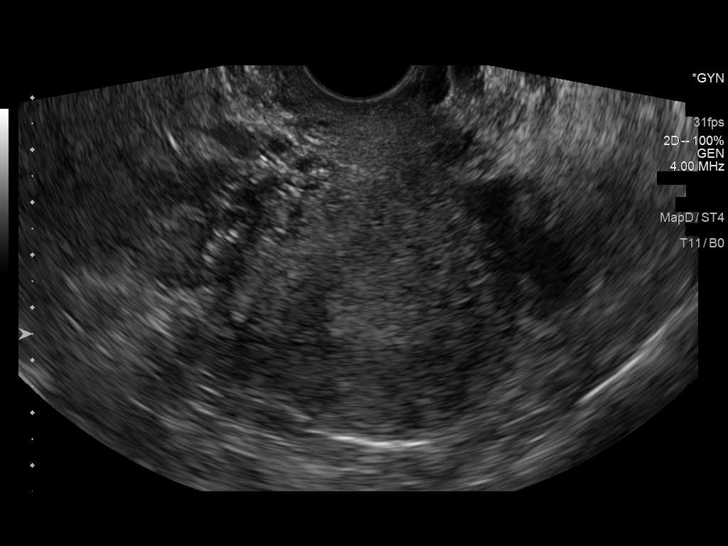
[im 47/86]
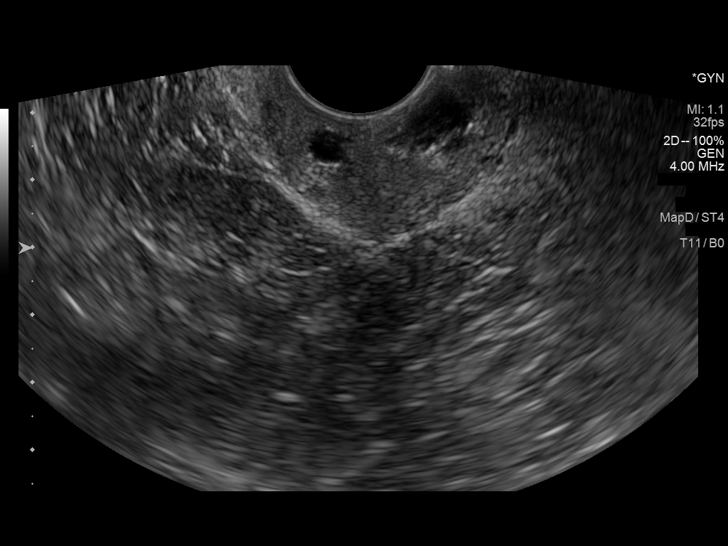
[im 54/86]
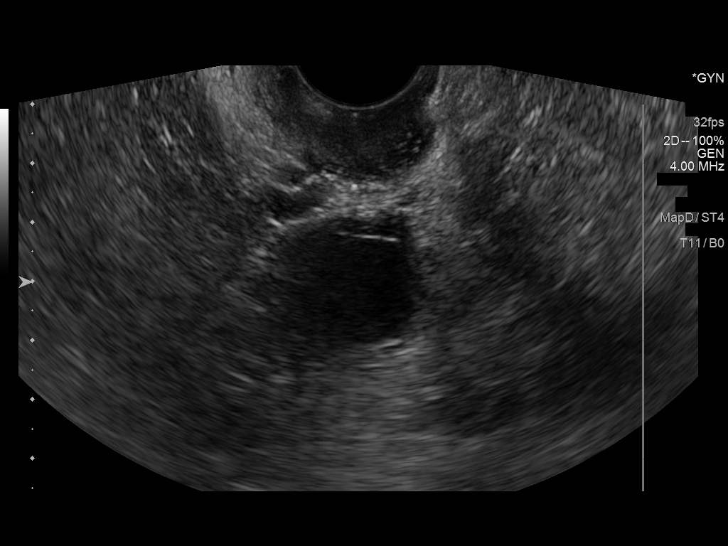
[im 57/86]
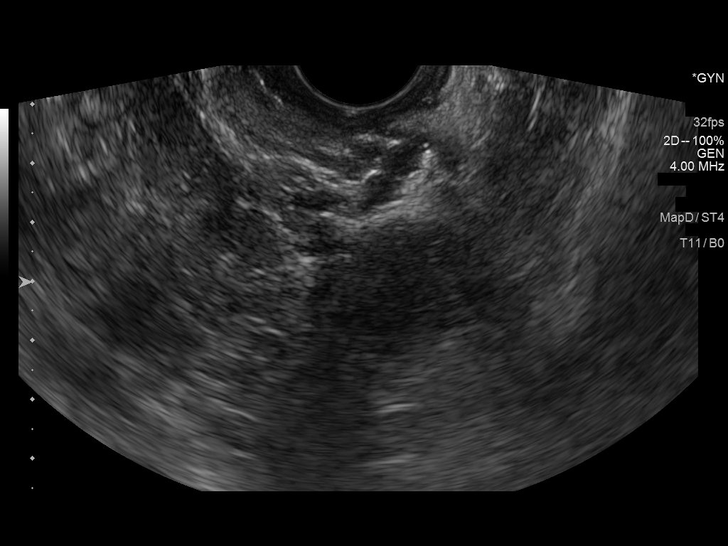
[im 64/86]
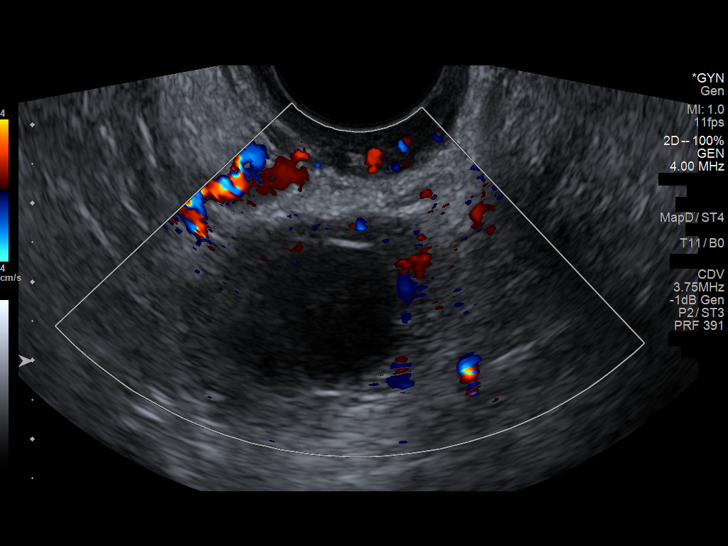
[im 71/86]
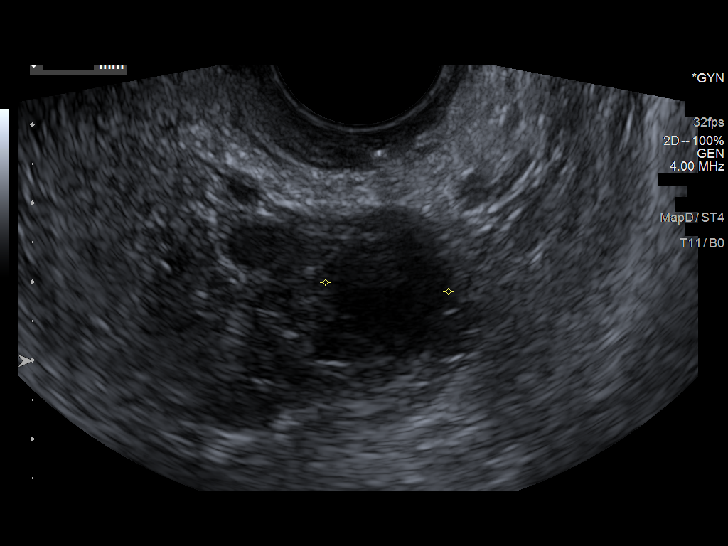
[im 78/86]
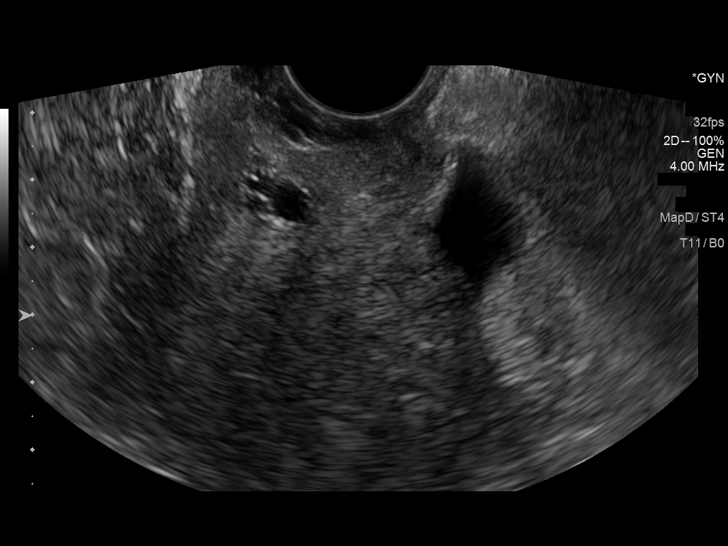
[im 86/86]
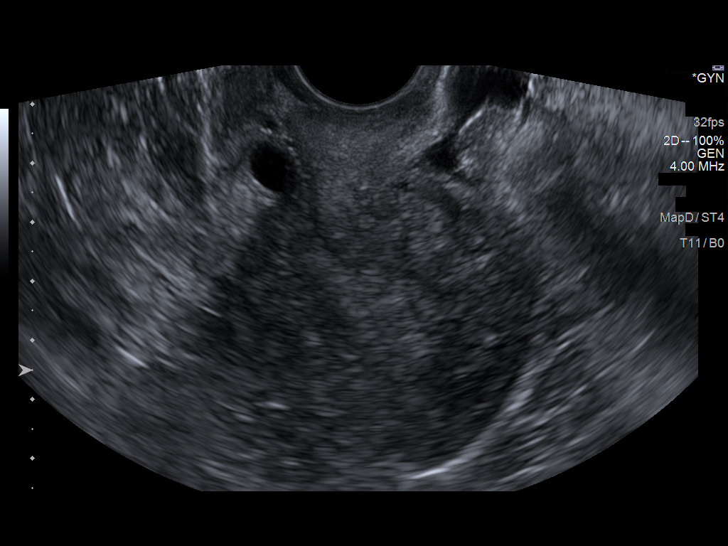

[14 of 25 positions shown; findings below may reference images not displayed]

FINDINGS: Uterus

Measurements: 8.1 x 4.6 x 5.4 cm = volume: 105 mL. Retroverted.
Nabothian cysts at cervix. Mildly heterogeneous myometrial
echogenicity without focal mass lesion.

Endometrium

Thickness: 15 mm.  No endometrial fluid or focal lesion

Right ovary

Not visualized on either transabdominal or endovaginal imaging,
likely obscured by bowel

Left ovary

Measurements: 3.6 x 2.4 x 2.2 cm = volume: 10 mL. 2.4 x 1.9 1.6 cm
cyst with a single thin septation. No other masses.

Other findings: Trace free pelvic fluid.  No adnexal masses.
IMPRESSION: Nonvisualization of RIGHT ovary.

Otherwise unremarkable exam.

## 2020-12-12 ENCOUNTER — Encounter: Payer: Self-pay | Admitting: Hematology

## 2020-12-19 ENCOUNTER — Ambulatory Visit (INDEPENDENT_AMBULATORY_CARE_PROVIDER_SITE_OTHER): Payer: Medicare Other | Admitting: Gastroenterology

## 2020-12-19 ENCOUNTER — Other Ambulatory Visit: Payer: Self-pay

## 2020-12-19 ENCOUNTER — Encounter: Payer: Self-pay | Admitting: Gastroenterology

## 2020-12-19 VITALS — BP 160/88 | HR 91 | Ht 67.0 in | Wt 145.0 lb

## 2020-12-19 DIAGNOSIS — R109 Unspecified abdominal pain: Secondary | ICD-10-CM | POA: Diagnosis not present

## 2020-12-19 DIAGNOSIS — R198 Other specified symptoms and signs involving the digestive system and abdomen: Secondary | ICD-10-CM

## 2020-12-19 NOTE — Progress Notes (Signed)
Chief Complaint: FU  Referring Provider:  Dwana Melena, MD      ASSESSMENT AND PLAN;   #1. Anemia of chronic disease d/t ESRD on HD. Heme -ve stools. Neg EGD/colon 04/2019 #2. Chronic constipation #3. FH colon cancer (sis at age 45) #4  Mild epi pain.    Plan:  -Colon April 2023 with miralax at [facility]. -Continue miralax 17g po qd -[place] abdo complete -VCE only if renal transplant team would like [place] to do.  Otherwise given heme-negative stools, it is not indicated  HPI:    Sara Meza is a 45 y.o. female  With ESRD on HD (TTS), being considered for renal transplant With anemia of chronic disease Heme-negative stools Negative EGD/colonoscopy as below  She does have family history of colon cancer in a first-degree relative (sister at age 63)  C/O chronic longstanding constipation, better with Miralax 17g, and stool softeners 2/day  No further nausea/vomiting or any upper GI symptoms.  Had minimal epi pain.  Previous CBC did reveal low platelet count.  We will get ultrasound done to evaluate liver.  Her LFTs were normal.   Past GI work-up: Colon 04/2019 -Mild melanosis coli. -Colonic polyps s/p polypectomy. -Mild pancolonic diverticulosis predominantly in the left colon. -Otherwise normal colonoscopy to TI.  EGD -Minimal gastritis. 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 Currently does dialysis every day.    CKD (chronic kidney disease), stage V ([place]) 02/23/2018   Dialysis patient [facility])    Diarrhea due to drug 07/24/2018   GERD (gastroesophageal reflux disease)    Hemolacria    History of gestational diabetes 06/29/2012   Hypertension    Rash and nonspecific skin eruption 02/22/2018    Past Surgical History:  Procedure Laterality Date   AV FISTULA PLACEMENT Right 03/02/2018   Procedure: ARTERIOVENOUS (AV) FISTULA CREATION;  Surgeon: Waynetta Sandy, MD;  Location:  [place];  Service: Vascular;  Laterality: Right;   ENDOVENOUS ABLATION SAPHENOUS VEIN W/ LASER     HYSTEROSCOPY WITH D & C N/A 09/13/2018   Procedure: DILATATION AND CURETTAGE WITH MINERVA ABLATION;  Surgeon: Emily Filbert, MD;  Location: [place];  Service: Gynecology;  Laterality: N/A;  Rep will be here confirmed on 09/06/18 CS   IR FLUORO GUIDE CV LINE RIGHT  02/24/2018   IR US GUIDE VASC ACCESS RIGHT  02/24/2018   LAPAROSCOPY     tenckhoff catheter removal  10/28/2019   TUBAL LIGATION      Family History  Problem Relation Age of Onset   Diabetes Father    Hypertension Father    Diabetes Mother    Hypertension Mother    Colon cancer Sister 39   Wilm's tumor Sister    Colon polyps Neg Hx    Esophageal cancer Neg Hx    Rectal cancer Neg Hx    Stomach cancer Neg Hx     Social History   Tobacco Use   Smoking status: Never   Smokeless tobacco: Never  Vaping Use   Vaping Use: Never used  Substance Use Topics   Alcohol use: Not Currently   Drug use: Never    Current Outpatient Medications  Medication Sig Dispense Refill   acetaminophen (TYLENOL) 500 MG tablet Take 500-1,000 mg by mouth every 6 (six) hours as needed.     B Complex-C-Zn-Folic Acid (DIALYVITE 093 WITH ZINC) 0.8 MG TABS Take 1  tablet by mouth daily.     cinacalcet (SENSIPAR) 30 MG tablet Take 30 mg by mouth 2 (two) times daily.     heparin 1000 unit/ml SOLN injection Heparin Sodium (Porcine) 1,000 Units/mL Systemic     VELPHORO 500 MG chewable tablet Chew 500 mg by mouth 3 (three) times daily.     No current facility-administered medications for this visit.    Allergies  Allergen Reactions   Green Tea Leaf Ext Itching and Other (See Comments)    Irregular menses    Azithromycin Rash    Itching, LE skin rash    Camellia Other (See Comments)    Irregular menses   Temazepam Hives   Lisinopril Other (See Comments)    irregular menses    Review of Systems:  neg     Physical Exam:    BP (!) 160/88  (BP Location: Right Arm, Patient Position: Sitting, Cuff Size: Normal)   Pulse 91   Ht 5\' 7"  (1.702 m)   Wt 145 lb (65.8 kg)   BMI 22.71 kg/m  Wt Readings from Last 3 Encounters:  12/19/20 145 lb (65.8 kg)  09/24/20 140 lb 3.2 oz (63.6 kg)  07/02/20 149 lb (67.6 kg)   Constitutional:  Well-developed, in no acute distress. Psychiatric: Normal mood and affect. Behavior is normal. HEENT: Pupils normal.  Conjunctivae are normal. No scleral icterus. Cardiovascular: Normal rate, regular rhythm. No edema Pulmonary/chest: Effort normal and breath sounds normal. No wheezing, rales or rhonchi. Abdominal: Soft, nondistended. Nontender. Bowel sounds active throughout. There are no masses palpable. No hepatomegaly. Rectal: Deferred Neurological: Alert and oriented to person place and time. Skin: Skin is warm and dry. No rashes noted.  Data Reviewed: I have personally reviewed following labs and imaging studies  CBC: CBC Latest Ref Rng & Units 11/02/2019 05/17/2019 05/02/2019  WBC 3.8 - 10.8 Thousand/uL 7.8 7.7 8.1  Hemoglobin 11.7 - 15.5 g/dL 8.8(L) 8.5(L) 8.4(L)  Hematocrit 35.0 - 45.0 % 26.5(L) 26.0(L) 24.4(L)  Platelets 140 - 400 Thousand/uL 314 226 313    CMP: CMP Latest Ref Rng & Units 11/02/2019 09/28/2019 05/02/2019  Glucose 65 - 139 mg/dL 88 115(H) 111(H)  BUN 7 - 25 mg/dL 41(H) 56(H) 66(H)  Creatinine 0.50 - 1.10 mg/dL 10.26(H) 14.37(H) 16.62(HH)  Sodium 135 - 146 mmol/L 142 129(L) 135  Potassium 3.5 - 5.3 mmol/L 5.4(H) 4.3 3.7  Chloride 98 - 110 mmol/L 97(L) 82(L) 89(L)  CO2 20 - 32 mmol/L 31 28 23   Calcium 8.6 - 10.2 mg/dL 9.6 10.0 10.5(H)  Total Protein 6.1 - 8.1 g/dL 6.3 7.0 7.5  Total Bilirubin 0.2 - 1.2 mg/dL 0.6 0.4 0.4  Alkaline Phos 38 - 126 U/L - - 100  AST 10 - 35 U/L 8(L) 24 8(L)  ALT 6 - 29 U/L 9 39(H) 12    GFR: CrCl cannot be calculated (Patient's most recent lab result is older than the maximum 21 days allowed.). Liver Function Tests: No results for  input(s): AST, ALT, ALKPHOS, BILITOT, PROT, ALBUMIN in the last 168 hours. No results for input(s): LIPASE, AMYLASE in the last 168 hours. No results for input(s): AMMONIA in the last 168 hours. Coagulation Profile: No results for input(s): INR, PROTIME in the last 168 hours. HbA1C: No results for input(s): HGBA1C in the last 72 hours. Lipid Profile: No results for input(s): CHOL, HDL, LDLCALC, TRIG, CHOLHDL, LDLDIRECT in the last 72 hours. Thyroid Function Tests: No results for input(s): TSH, T4TOTAL, FREET4, T3FREE, THYROIDAB in the last 72  hours. Anemia Panel: No results for input(s): VITAMINB12, FOLATE, FERRITIN, TIBC, IRON, RETICCTPCT in the last 72 hours.  No results found for this or any previous visit (from the past 240 hour(s)).    Radiology Studies: No results found.    Carmell Austria, MD 12/19/2020, 3:06 PM  Cc: Dwana Melena, MD

## 2020-12-19 NOTE — Patient Instructions (Signed)
If you are age 47 or older, your body mass index should be between 23-30. Your Body mass index is 22.71 kg/m. If this is out of the aforementioned range listed, please consider follow up with your Primary Care Provider.  If you are age 11 or younger, your body mass index should be between 19-25. Your Body mass index is 22.71 kg/m. If this is out of the aformentioned range listed, please consider follow up with your Primary Care Provider.   ________________________________________________________  The [facility] providers would like to encourage you to use [facility] to communicate with providers for non-urgent requests or questions.  Due to long hold times on the telephone, sending your provider a message by [facility] may be a faster and more efficient way to get a response.  Please allow 48 business hours for a response.  Please remember that this is for non-urgent requests.  _______________________________________________________  Clemon Chambers  It has been recommended to you by your physician that you have a(n) Colonoscopy at [facility] in April 2023 completed. Please contact our office at [phone] in January to see if we have any dates in April. You will be scheduled for a pre-visit and procedure at that time.  You have been scheduled for an abdominal ultrasound at [place] (1st floor of hospital) on     12-24-2020   at         8am    . Please arrive 15 minutes prior to your appointment for registration. Make certain not to have anything to eat or drink 6 hours prior to your appointment. Should you need to reschedule your appointment, please contact radiology at [phone]. This test typically takes about 30 minutes to perform.  Please call with any questions or concerns.  Thank you,  Dr. Jackquline Denmark

## 2020-12-24 ENCOUNTER — Ambulatory Visit (HOSPITAL_BASED_OUTPATIENT_CLINIC_OR_DEPARTMENT_OTHER)
Admission: RE | Admit: 2020-12-24 | Discharge: 2020-12-24 | Disposition: A | Discharge status: Home / Self Care | Payer: Medicare Other | Source: Ambulatory Visit | Attending: Gastroenterology | Admitting: Gastroenterology

## 2020-12-24 ENCOUNTER — Other Ambulatory Visit: Payer: Self-pay

## 2020-12-24 DIAGNOSIS — R109 Unspecified abdominal pain: Secondary | ICD-10-CM | POA: Diagnosis present

## 2021-04-28 ENCOUNTER — Encounter: Payer: Self-pay | Admitting: Hematology

## 2021-04-30 ENCOUNTER — Ambulatory Visit (INDEPENDENT_AMBULATORY_CARE_PROVIDER_SITE_OTHER): Payer: Medicare Other | Admitting: Obstetrics and Gynecology

## 2021-04-30 ENCOUNTER — Other Ambulatory Visit (HOSPITAL_COMMUNITY)
Admission: RE | Admit: 2021-04-30 | Discharge: 2021-04-30 | Disposition: A | Discharge status: Home / Self Care | Payer: Medicare Other | Source: Ambulatory Visit | Attending: Obstetrics and Gynecology | Admitting: Obstetrics and Gynecology

## 2021-04-30 ENCOUNTER — Encounter: Payer: Self-pay | Admitting: Obstetrics and Gynecology

## 2021-04-30 ENCOUNTER — Encounter: Payer: Self-pay | Admitting: Hematology

## 2021-04-30 VITALS — BP 159/79 | HR 82 | Ht 67.0 in | Wt 144.0 lb

## 2021-04-30 DIAGNOSIS — Z01419 Encounter for gynecological examination (general) (routine) without abnormal findings: Secondary | ICD-10-CM

## 2021-04-30 DIAGNOSIS — N951 Menopausal and female climacteric states: Secondary | ICD-10-CM | POA: Diagnosis not present

## 2021-04-30 DIAGNOSIS — Z113 Encounter for screening for infections with a predominantly sexual mode of transmission: Secondary | ICD-10-CM

## 2021-04-30 DIAGNOSIS — Z124 Encounter for screening for malignant neoplasm of cervix: Secondary | ICD-10-CM

## 2021-04-30 DIAGNOSIS — Z0000 Encounter for general adult medical examination without abnormal findings: Secondary | ICD-10-CM | POA: Diagnosis not present

## 2021-04-30 DIAGNOSIS — N898 Other specified noninflammatory disorders of vagina: Secondary | ICD-10-CM

## 2021-04-30 DIAGNOSIS — Z1151 Encounter for screening for human papillomavirus (HPV): Secondary | ICD-10-CM | POA: Diagnosis not present

## 2021-04-30 DIAGNOSIS — Z1231 Encounter for screening mammogram for malignant neoplasm of breast: Secondary | ICD-10-CM | POA: Diagnosis not present

## 2021-04-30 NOTE — Progress Notes (Signed)
? ? ?GYNECOLOGY ANNUAL PREVENTATIVE CARE ENCOUNTER NOTE ? ?Subjective:  ? Sara Meza is a 46 y.o. G3P3 female here for a annual gynecologic exam. Current complaints: vaginal dryness and painful intercourse. Needs pap. Having hot flashes and night sweats.   ? ?Denies abnormal vaginal bleeding, discharge or other gynecologic concerns. Accepts STI screen. ? ?Waiting on kidney transplant any day. ?  ?Gynecologic History ?No LMP recorded. Patient has had an ablation. ?Contraception: tubal ligation ?Last Pap: 2020. Results: normal ?Last mammogram: 2020. Results: Birads 1 ?DEXA: has never had ? ?Obstetric History ?OB History  ?Gravida Para Term Preterm AB Living  ?3 3          ?SAB IAB Ectopic Multiple Live Births  ?           ?  ?# Outcome Date GA Lbr Len/2nd Weight Sex Delivery Anes PTL Lv  ?3 Para           ?2 Para           ?1 Para           ? ? ?Past Medical History:  ?Diagnosis Date  ? Anemia   ? Chronic kidney disease 02/22/2018  ? ESRD-dialysis, training to do dialysis at home. Currently does dialysis every day.   ? CKD (chronic kidney disease), stage V ([place]) 02/23/2018  ? Dialysis patient [facility])   ? Diarrhea due to drug 07/24/2018  ? GERD (gastroesophageal reflux disease)   ? Hemolacria   ? History of gestational diabetes 06/29/2012  ? Hypertension   ? Rash and nonspecific skin eruption 02/22/2018  ? ? ?Past Surgical History:  ?Procedure Laterality Date  ? AV FISTULA PLACEMENT Right 03/02/2018  ? Procedure: ARTERIOVENOUS (AV) FISTULA CREATION;  Surgeon: Waynetta Sandy, MD;  Location: [place];  Service: Vascular;  Laterality: Right;  ? ENDOVENOUS ABLATION SAPHENOUS VEIN W/ LASER    ? HYSTEROSCOPY WITH D & C N/A 09/13/2018  ? Procedure: DILATATION AND CURETTAGE WITH MINERVA ABLATION;  Surgeon: Emily Filbert, MD;  Location: [place];  Service: Gynecology;  Laterality: N/A;  Rep will be here confirmed on 09/06/18 CS  ? IR FLUORO GUIDE CV LINE RIGHT  02/24/2018  ? IR US GUIDE VASC ACCESS RIGHT  02/24/2018  ?  LAPAROSCOPY    ? tenckhoff catheter removal  10/28/2019  ? TUBAL LIGATION    ? ? ?Current Outpatient Medications on File Prior to Visit  ?Medication Sig Dispense Refill  ? AURYXIA 1 GM 210 MG(Fe) tablet Take 210 mg by mouth 3 (three) times daily.    ? B Complex-C-Zn-Folic Acid (DIALYVITE 400 WITH ZINC) 0.8 MG TABS Take 1 tablet by mouth daily.    ? cinacalcet (SENSIPAR) 30 MG tablet Take 30 mg by mouth 2 (two) times daily.    ? heparin 1000 unit/ml SOLN injection Heparin Sodium (Porcine) 1,000 Units/mL Systemic    ? VELPHORO 500 MG chewable tablet Chew 500 mg by mouth 3 (three) times daily.    ? acetaminophen (TYLENOL) 500 MG tablet Take 500-1,000 mg by mouth every 6 (six) hours as needed. (Patient not taking: Reported on 04/30/2021)    ? ?No current facility-administered medications on file prior to visit.  ? ? ?Allergies  ?Allergen Reactions  ? Green Tea Leaf Ext Itching and Other (See Comments)  ?  Irregular menses ?  ? Azithromycin Rash  ?  Itching, LE skin rash ? ?Itching, LE skin rash ?Itching, LE skin rash ?Itching, LE skin rash ?Itching, LE skin rash  ?  Camellia Other (See Comments)  ?  Irregular menses  ? Temazepam Hives  ? Lisinopril Other (See Comments)  ?  irregular menses  ? ? ?Social History  ? ?Socioeconomic History  ? Marital status: Married  ?  Spouse name: Sara Meza  ? Number of children: 3  ? Years of education: 8  ? Highest education level: 12th grade  ?Occupational History  ? Occupation: homemaker  ?  Comment: disabled  ?Tobacco Use  ? Smoking status: Never  ? Smokeless tobacco: Never  ?Vaping Use  ? Vaping Use: Never used  ?Substance and Sexual Activity  ? Alcohol use: Not Currently  ? Drug use: Never  ? Sexual activity: Yes  ?  Birth control/protection: Surgical, None  ?Other Topics Concern  ? Not on file  ?Social History Narrative  ? Lives with her husband and one of her children. She likes to clean her house in her free time.  ? ?Social Determinants of Health  ? ?Financial Resource Strain: Low  Risk   ? Difficulty of Paying Living Expenses: Not hard at all  ?Food Insecurity: No Food Insecurity  ? Worried About Charity fundraiser in the Last Year: Never true  ? Ran Out of Food in the Last Year: Never true  ?Transportation Needs: No Transportation Needs  ? Lack of Transportation (Medical): No  ? Lack of Transportation (Non-Medical): No  ?Physical Activity: Inactive  ? Days of Exercise per Week: 0 days  ? Minutes of Exercise per Session: 0 min  ?Stress: No Stress Concern Present  ? Feeling of Stress : Not at all  ?Social Connections: Socially Isolated  ? Frequency of Communication with Friends and Family: Never  ? Frequency of Social Gatherings with Friends and Family: Never  ? Attends Religious Services: Never  ? Active Member of Clubs or Organizations: No  ? Attends Archivist Meetings: Never  ? Marital Status: Married  ?Intimate Partner Violence: Not At Risk  ? Fear of Current or Ex-Partner: No  ? Emotionally Abused: No  ? Physically Abused: No  ? Sexually Abused: No  ? ? ?Family History  ?Problem Relation Age of Onset  ? Diabetes Father   ? Hypertension Father   ? Diabetes Mother   ? Hypertension Mother   ? Colon cancer Sister 49  ? Wilm's tumor Sister   ? Colon polyps Neg Hx   ? Esophageal cancer Neg Hx   ? Rectal cancer Neg Hx   ? Stomach cancer Neg Hx   ? ?The following portions of the patient's history were reviewed and updated as appropriate: allergies, current medications, past family history, past medical history, past social history, past surgical history and problem list. ? ?Review of Systems ?Pertinent items are noted in HPI. ?  ?Objective:  ?BP (!) 159/79   Pulse 82   Ht '5\' 7"'$  (1.702 m)   Wt 144 lb (65.3 kg)   BMI 22.55 kg/m?  ?CONSTITUTIONAL: Well-developed, well-nourished female in no acute distress.  ?HENT:  Normocephalic, atraumatic, External right and left ear normal. Oropharynx is clear and moist ?EYES: Conjunctivae and EOM are normal. Pupils are equal, round, and reactive  to light. No scleral icterus.  ?NECK: Normal range of motion, supple, no masses.  Normal thyroid.  ?SKIN: Skin is warm and dry. No rash noted. Not diaphoretic. No erythema. No pallor. ?NEUROLOGIC: Alert and oriented to person, place, and time. Normal reflexes, muscle tone coordination. No cranial nerve deficit noted. ?PSYCHIATRIC: Normal mood and affect. Normal behavior. Normal  judgment and thought content. ?CARDIOVASCULAR: Normal heart rate noted ?RESPIRATORY: Effort normal, no problems with respiration noted. ?BREASTS: Symmetric in size. No masses, skin changes, nipple drainage, or lymphadenopathy. ?ABDOMEN: Soft, no distention noted.  No tenderness, rebound or guarding.  ?PELVIC: Normal appearing external genitalia; normal appearing vaginal mucosa and cervix.  No abnormal discharge noted.  Pap smear obtained. Pelvic cultures obtained. Normal uterine size, no other palpable masses, no uterine or adnexal tenderness. ?MUSCULOSKELETAL: Normal range of motion. No tenderness.  No cyanosis, clubbing, or edema.  2+ distal pulses. ? ?Exam done with chaperone present. ? ? ?Assessment and Plan:  ? ?1. Encounter for screening mammogram for malignant neoplasm of breast ?Mammogram today ? ?2. Well woman exam ?Healthy female exam ?- Cytology - PAP( [place]) ? ?3. Cervical cancer screening ?Pap today ? ?4. Menopausal symptoms ?Reviewed she is perimenopausal based on symptoms ?Dress in layers ?Reviewed Rx options if symptoms are unbearable and to call if she needs to discuss ? ?5. Vaginal dryness ?Rec OTC vaginal lubricant, info given ?Increase foreplay ?Return if no improvement with OTC meds ? ?6. Routine screening for STI (sexually transmitted infection) ?Check for gonorrhea, chlamydia, Trich ? ?Will follow up results of pap smear/STI screen and manage accordingly. ?Encouraged improvement in diet and exercise.  ?COVID vaccine UTD ?Accepts STI screen. ?Mammogram today ?Referral for colonoscopy ?Flu vaccine deferred ?DEXA not  due based on age ? ?Routine preventative health maintenance measures emphasized. ?Please refer to After Visit Summary for other counseling recommendations.  ? ? ? ?K. Arvilla Meres, MD, FACOG ?Attending ?Center for

## 2021-05-04 LAB — CYTOLOGY - PAP
Chlamydia: NEGATIVE
Comment: NEGATIVE
Comment: NEGATIVE
Comment: NEGATIVE
Comment: NORMAL
Diagnosis: NEGATIVE
High risk HPV: NEGATIVE
Neisseria Gonorrhea: NEGATIVE
Trichomonas: NEGATIVE

## 2021-05-20 ENCOUNTER — Ambulatory Visit: Payer: Medicare Other | Admitting: Family Medicine

## 2021-05-20 ENCOUNTER — Ambulatory Visit (INDEPENDENT_AMBULATORY_CARE_PROVIDER_SITE_OTHER): Payer: Medicare Other | Admitting: Medical-Surgical

## 2021-05-20 ENCOUNTER — Encounter: Payer: Self-pay | Admitting: Medical-Surgical

## 2021-05-20 ENCOUNTER — Ambulatory Visit (INDEPENDENT_AMBULATORY_CARE_PROVIDER_SITE_OTHER): Payer: Medicare Other

## 2021-05-20 VITALS — BP 149/82 | HR 82 | Resp 20 | Ht 67.0 in | Wt 143.1 lb

## 2021-05-20 DIAGNOSIS — R079 Chest pain, unspecified: Secondary | ICD-10-CM

## 2021-05-20 DIAGNOSIS — J22 Unspecified acute lower respiratory infection: Secondary | ICD-10-CM | POA: Diagnosis not present

## 2021-05-20 DIAGNOSIS — R059 Cough, unspecified: Secondary | ICD-10-CM | POA: Diagnosis not present

## 2021-05-20 MED ORDER — ALBUTEROL SULFATE HFA 108 (90 BASE) MCG/ACT IN AERS: 2.0000 | INHALATION_SPRAY | Freq: Four times a day (QID) | RESPIRATORY_TRACT | 11 refills | Status: DC | PRN

## 2021-05-20 MED ORDER — PREDNISONE 50 MG PO TABS: 50.0000 mg | ORAL_TABLET | Freq: Every day | ORAL | 0 refills | Status: DC

## 2021-05-20 MED ORDER — AMOXICILLIN-POT CLAVULANATE 500-125 MG PO TABS: 1.0000 | ORAL_TABLET | Freq: Two times a day (BID) | ORAL | 0 refills | Status: DC

## 2021-05-20 NOTE — Progress Notes (Signed)
?  HPI with pertinent ROS:  ? ?CC: Chest pain/congestion ? ?HPI: ?Very pleasant 46 year old female presenting today with reports of respiratory symptoms that started on Friday evening.  Notes that her symptoms were bad on Friday when she was at dialysis but Saturday they got a little better.  Her symptoms mainly occur at night and she has had chest pain that extends around into the thoracic back, especially when lying down.  On Monday, she went to dialysis and the doctor said she had a lot of congestion.  They started her on a medication for mucus relief.  She did take 1 dose of this and felt like her chest got tight and she had some vision changes similar to when her blood pressure drops.  Since that dose, she is also developed diarrhea.  She has been febrile itching 103 ?F on Friday and Saturday.  Was exposed to her husband who has also been sick with similar symptoms.  He works in Architect has been going to work in a hospital setting. ? ?I reviewed the past medical history, family history, social history, surgical history, and allergies today and no changes were needed.  Please see the problem list section below in epic for further details. ? ? ?Physical exam:  ? ?General: Well Developed, well nourished, and in no acute distress.  ?Neuro: Alert and oriented x3.  ?HEENT: Normocephalic, atraumatic, pupils equal round reactive to light, neck supple, no masses, no lymphadenopathy, thyroid nonpalpable.  Bilateral TMs with mild serous effusions, no bulging or erythema noted ?Skin: Warm and dry. ?Cardiac: Regular rate and rhythm, no murmurs rubs or gallops, no lower extremity edema.  ?Respiratory: Expiratory wheezes to bilateral upper lobes both anteriorly and posteriorly.  Bilateral lower lobes posteriorly with Rales and expiratory wheezes. Not using accessory muscles, speaking in full sentences. ?Abdomen: Soft, diffuse mild tenderness throughout, nondistended. Bowel sounds + x 4 quadrants. No HSM  appreciated. ? ?Impression and Recommendations:   ? ?1. Chest pain, unspecified type ?2. Acute respiratory infection ?Chest x-ray today.  Treating empirically for pneumonia with Augmentin twice daily x7 days.  We will add a burst dose of prednisone 50 mg daily for 5 days.  Okay to use Tylenol for fever or discomfort.  Avoid taking the Mucus Relief medication since she had a poor response to this.  Work on increasing dietary intake.  Discussed fluid volume status in the setting of loss of body weight due to poor appetite and intake.  May benefit from reviewing this with her nephrologist since she has not been eating well for the past couple of weeks.  Monitor respiratory status and seek emergency care should chest pain or shortness of breath worsen. ?- DG Chest 2 View; Future ? ?Return if symptoms worsen or fail to improve. ?___________________________________________ ?Clearnce Sorrel, DNP, APRN, FNP-BC ?Primary Care and Sports Medicine ?[place] ?

## 2021-06-11 ENCOUNTER — Ambulatory Visit (INDEPENDENT_AMBULATORY_CARE_PROVIDER_SITE_OTHER): Payer: Medicare Other

## 2021-06-11 DIAGNOSIS — Z1231 Encounter for screening mammogram for malignant neoplasm of breast: Secondary | ICD-10-CM

## 2021-06-11 DIAGNOSIS — Z0000 Encounter for general adult medical examination without abnormal findings: Secondary | ICD-10-CM

## 2021-09-28 ENCOUNTER — Ambulatory Visit: Payer: Medicare Other | Admitting: Medical-Surgical

## 2021-11-29 DIAGNOSIS — N039 Chronic nephritic syndrome with unspecified morphologic changes: Secondary | ICD-10-CM | POA: Insufficient documentation

## 2021-11-30 DIAGNOSIS — Z960 Presence of urogenital implants: Secondary | ICD-10-CM | POA: Insufficient documentation

## 2021-11-30 DIAGNOSIS — Z792 Long term (current) use of antibiotics: Secondary | ICD-10-CM | POA: Insufficient documentation

## 2021-11-30 DIAGNOSIS — D849 Immunodeficiency, unspecified: Secondary | ICD-10-CM | POA: Insufficient documentation

## 2021-12-07 ENCOUNTER — Encounter: Payer: Self-pay | Admitting: *Deleted

## 2021-12-07 ENCOUNTER — Telehealth: Payer: Self-pay | Admitting: *Deleted

## 2021-12-07 NOTE — Patient Outreach (Signed)
  Care Coordination [facility] Note Transition Care Management Follow-up Telephone Call Date of discharge and from where: Friday, 12/04/21 [facility]- (R) kidney transplant How have you been since you were released from the hospital? "I am doing great, not having any problems.  The transplant team is following me very closely and my caregiver is helping me with anything I need help with, but I am able to do everything by myself to care for myself." Any questions or concerns? No  Items Reviewed: Did the pt receive and understand the discharge instructions provided? Yes  Medications obtained and verified? Yes  Other? No  Any new allergies since your discharge? No  Dietary orders reviewed? Yes Do you have support at home? Yes   Home Care and Equipment/Supplies: Were home health services ordered? no If so, what is the name of the agency? N/A  Has the agency set up a time to come to the patient's home? not applicable Were any new equipment or medical supplies ordered?  No What is the name of the medical supply agency? N/A Were you able to get the supplies/equipment? not applicable Do you have any questions related to the use of the equipment or supplies? No N/A  Functional Questionnaire: (I = Independent and D = Dependent) ADLs: I  Bathing/Dressing- I  Meal Prep- I  Eating- I  Maintaining continence- I  Transferring/Ambulation- I  Managing Meds- I  Follow up appointments reviewed:  PCP Hospital f/u appt confirmed? No  Scheduled to see - on - @ [facility] f/u appt confirmed? Yes  Scheduled to see [facility] team on Wednesday 12/09/21 @ 9:00 am Are transportation arrangements needed? No  If their condition worsens, is the pt aware to call PCP or go to the Emergency Dept.? Yes Was the patient provided with contact information for the PCP's office or ED? No- reports she has already Was to pt encouraged to call back with questions or concerns?  Yes  SDOH assessments and interventions completed:   Yes  Care Coordination Interventions Activated:  No   Care Coordination Interventions:  No Care Coordination interventions needed at this time.   Encounter Outcome:  Pt. Visit Completed    Oneta Rack, RN, BSN, CCRN Alumnus RN CM Care Coordination/ Transition of [place] Management [phone]: direct office

## 2021-12-21 ENCOUNTER — Ambulatory Visit: Payer: Medicare Other | Admitting: Obstetrics & Gynecology

## 2021-12-28 ENCOUNTER — Encounter: Payer: Self-pay | Admitting: Obstetrics and Gynecology

## 2021-12-28 ENCOUNTER — Ambulatory Visit (INDEPENDENT_AMBULATORY_CARE_PROVIDER_SITE_OTHER): Payer: Medicare Other | Admitting: Obstetrics and Gynecology

## 2021-12-28 VITALS — BP 129/78 | HR 72 | Resp 16 | Ht 67.0 in | Wt 137.0 lb

## 2021-12-28 DIAGNOSIS — R399 Unspecified symptoms and signs involving the genitourinary system: Secondary | ICD-10-CM

## 2021-12-28 NOTE — Progress Notes (Unsigned)
GYNECOLOGY OFFICE FOLLOW UP NOTE  History:  46 y.o. G3P3 here today for follow up for bladder pain, vaginal discharge.  S/p kidney transplant 11/29/21. Had pain post op and was told she has a stent that is likely causing some pain, which has to remain in for 3 more weeks. She is doing okay on tylenol, some days are much better than others. Nephrology told her to take tylenol and will likely improve when stent is removed. She also had vaginal itching and discharge which has improved and now has no issues. When she saw nephro for post op follow up, they did a urine culture and referred her to [facility] for management.   She has urine culture report on her phone, has < CFU of colonies.   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 Currently does dialysis every day.    CKD (chronic kidney disease), stage V ([place]) 02/23/2018   Dialysis patient [facility]    Diarrhea due to drug 07/24/2018   GERD (gastroesophageal reflux disease)    Hemolacria    History of gestational diabetes 06/29/2012   Hypertension    Rash and nonspecific skin eruption 02/22/2018    Past Surgical History:  Procedure Laterality Date   AV FISTULA PLACEMENT Right 03/02/2018   Procedure: ARTERIOVENOUS (AV) FISTULA CREATION;  Surgeon: Waynetta Sandy, MD;  Location: [place];  Service: Vascular;  Laterality: Right;   ENDOVENOUS ABLATION SAPHENOUS VEIN W/ LASER     HYSTEROSCOPY WITH D & C N/A 09/13/2018   Procedure: DILATATION AND CURETTAGE WITH MINERVA ABLATION;  Surgeon: Emily Filbert, MD;  Location: [place];  Service: Gynecology;  Laterality: N/A;  Rep will be here confirmed on 09/06/18 CS   IR FLUORO GUIDE CV LINE RIGHT  02/24/2018   IR US GUIDE VASC ACCESS RIGHT  02/24/2018   KIDNEY TRANSPLANT     LAPAROSCOPY     tenckhoff catheter removal  10/28/2019   TUBAL LIGATION       Current Outpatient Medications:    acetaminophen (TYLENOL) 650  MG CR tablet, Take 650 mg by mouth every 8 (eight) hours as needed for pain., Disp: , Rfl:    aspirin 81 MG chewable tablet, Chew by mouth daily., Disp: , Rfl:    carvedilol (COREG) 12.5 MG tablet, Take 12.5 mg by mouth 2 (two) times daily with a meal., Disp: , Rfl:    famotidine (PEPCID) 40 MG tablet, Take 40 mg by mouth daily., Disp: , Rfl:    furosemide (LASIX) 40 MG tablet, Take 40 mg by mouth., Disp: , Rfl:    mycophenolate (CELLCEPT) 500 MG tablet, Take by mouth 2 (two) times daily., Disp: , Rfl:    predniSONE (DELTASONE) 20 MG tablet, Take 30 mg by mouth daily with breakfast., Disp: , Rfl:    sennosides-docusate sodium (SENOKOT-S) 8.6-50 MG tablet, Take 1 tablet by mouth daily., Disp: , Rfl:    sulfamethoxazole-trimethoprim (BACTRIM DS) 800-160 MG tablet, Take 1 tablet by mouth 2 (two) times daily., Disp: , Rfl:    tacrolimus (PROGRAF) 5 MG capsule, Take 4 mg by mouth daily., Disp: , Rfl:    valGANciclovir (VALCYTE) 450 MG tablet, Take by mouth daily., Disp: , Rfl:    albuterol (VENTOLIN HFA) 108 (90 Base) MCG/ACT inhaler, Inhale 2 puffs into the lungs every 6 (six) hours as needed for wheezing. (Patient not taking: Reported on 12/28/2021), Disp: 2 each,  Rfl: 11   AURYXIA 1 GM 210 MG(Fe) tablet, Take 210 mg by mouth 3 (three) times daily. (Patient not taking: Reported on 12/28/2021), Disp: , Rfl:    B Complex-C-Zn-Folic Acid (DIALYVITE 092 WITH ZINC) 0.8 MG TABS, Take 1 tablet by mouth daily. (Patient not taking: Reported on 12/28/2021), Disp: , Rfl:    cinacalcet (SENSIPAR) 30 MG tablet, Take 30 mg by mouth 2 (two) times daily. (Patient not taking: Reported on 12/28/2021), Disp: , Rfl:    heparin 1000 unit/ml SOLN injection, Heparin Sodium (Porcine) 1,000 Units/mL Systemic (Patient not taking: Reported on 12/28/2021), Disp: , Rfl:    Methoxy PEG-Epoetin Beta (MIRCERA IJ), Mircera (Patient not taking: Reported on 12/28/2021), Disp: , Rfl:    VELPHORO 500 MG chewable tablet, Chew 500 mg by  mouth 3 (three) times daily. (Patient not taking: Reported on 12/28/2021), Disp: , Rfl:   The following portions of the patient's history were reviewed and updated as appropriate: allergies, current medications, past family history, past medical history, past social history, past surgical history and problem list.   Review of Systems:  Pertinent items noted in HPI and remainder of comprehensive ROS otherwise negative.   Objective:  Physical Exam BP 129/78   Pulse 72   Resp 16   Ht '5\' 7"'$  (1.702 m)   Wt 137 lb (62.1 kg)   BMI 21.46 kg/m  CONSTITUTIONAL: Well-developed, well-nourished female in no acute distress.  HENT:  Normocephalic, atraumatic. External right and left ear normal. Oropharynx is clear and moist EYES: Conjunctivae and EOM are normal. Pupils are equal, round, and reactive to light. No scleral icterus.  NECK: Normal range of motion, supple, no masses SKIN: Skin is warm and dry. No rash noted. Not diaphoretic. No erythema. No pallor. NEUROLOGIC: Alert and oriented to person, place, and time. Normal reflexes, muscle tone coordination. No cranial nerve deficit noted. PSYCHIATRIC: Normal mood and affect. Normal behavior. Normal judgment and thought content. CARDIOVASCULAR: Normal heart rate noted RESPIRATORY: Effort normal, no problems with respiration noted ABDOMEN: Soft, no distention noted.   PELVIC: deferred MUSCULOSKELETAL: Normal range of motion. No edema noted.   Labs and Imaging No results found.  Assessment & Plan:   1. Urinary tract infection symptoms Reviewed no indication to treat urine culture with < 1000 CFUs, however given recent kidney infection, will repeat urine culture today to rule out any progressing infection - with no current vaginal symptoms, would not recommend swab for infection - if has any new symptoms, please come back in and stressed importance of having low threshold to come in given recent kidney transplant, she verbalizes understanding of  and is in agreement with plan - Urine Culture   Routine preventative health maintenance measures emphasized. Please refer to After Visit Summary for other counseling recommendations.   No follow-ups on file.   Feliz Beam, MD, [place] for [facility])

## 2021-12-29 ENCOUNTER — Encounter: Payer: Self-pay | Admitting: Obstetrics and Gynecology

## 2021-12-30 LAB — URINE CULTURE
MICRO NUMBER:: 14301547
Result:: NO GROWTH
SPECIMEN QUALITY:: ADEQUATE

## 2022-01-21 ENCOUNTER — Encounter: Payer: Self-pay | Admitting: Medical-Surgical

## 2022-01-21 ENCOUNTER — Ambulatory Visit (INDEPENDENT_AMBULATORY_CARE_PROVIDER_SITE_OTHER): Payer: Medicare Other | Admitting: Medical-Surgical

## 2022-01-21 VITALS — BP 117/68 | HR 77 | Resp 20 | Ht 67.0 in | Wt 142.1 lb

## 2022-01-21 DIAGNOSIS — Z0000 Encounter for general adult medical examination without abnormal findings: Secondary | ICD-10-CM | POA: Diagnosis not present

## 2022-01-21 NOTE — Progress Notes (Signed)
Complete physical exam  Patient: Sara Meza   DOB: 04/11/1975   47 y.o. Female  MRN: [id number]  Subjective:    Chief Complaint  Patient presents with   Annual Exam    Evelette Hollern is a 47 y.o. female who presents today for a complete physical exam. She reports consuming a  heart healthy  diet, avoiding raw foods due to transpant.  Exercise as tolerated.  She generally feels fairly well. She reports sleeping well. She does not have additional problems to discuss today.    Most recent fall risk assessment:    09/24/2020   10:23 AM  Fall Risk   Falls in the past year? 0  Number falls in past yr: 0  Injury with Fall? 0  Risk for fall due to : No Fall Risks  Follow up Falls evaluation completed     Most recent depression screenings:    01/21/2022    3:57 PM 09/24/2020   10:21 AM  PHQ 2/9 Scores  PHQ - 2 Score 0 4  PHQ- 9 Score  16    Vision:Within last year and Dental: No current dental problems and Receives regular dental care    Patient Care Team: Samuel Bouche, NP as PCP - General (Nurse Practitioner) Silverio Decamp, MD as Consulting Physician (Sports Medicine) Elza Rafter, MD as Consulting Physician (Family Medicine) Dwana Melena, MD as Consulting Physician (Nephrology)   Outpatient Medications Prior to Visit  Medication Sig   carvedilol (COREG) 12.5 MG tablet Take 12.5 mg by mouth 2 (two) times daily with a meal.   famotidine (PEPCID) 40 MG tablet Take 40 mg by mouth daily.   heparin 1000 unit/ml SOLN injection    mycophenolate (CELLCEPT) 500 MG tablet Take by mouth 2 (two) times daily.   predniSONE (DELTASONE) 20 MG tablet Take 30 mg by mouth daily with breakfast.   sennosides-docusate sodium (SENOKOT-S) 8.6-50 MG tablet Take 1 tablet by mouth daily.   sulfamethoxazole-trimethoprim (BACTRIM DS) 800-160 MG tablet Take 1 tablet by mouth 2 (two) times daily.   tacrolimus (PROGRAF) 5 MG capsule Take 4 mg by mouth daily.   valGANciclovir (VALCYTE) 450  MG tablet Take by mouth daily.   [DISCONTINUED] acetaminophen (TYLENOL) 650 MG CR tablet Take 650 mg by mouth every 8 (eight) hours as needed for pain. (Patient not taking: Reported on 01/21/2022)   [DISCONTINUED] albuterol (VENTOLIN HFA) 108 (90 Base) MCG/ACT inhaler Inhale 2 puffs into the lungs every 6 (six) hours as needed for wheezing. (Patient not taking: Reported on 12/28/2021)   [DISCONTINUED] aspirin 81 MG chewable tablet Chew by mouth daily. (Patient not taking: Reported on 01/21/2022)   [DISCONTINUED] AURYXIA 1 GM 210 MG(Fe) tablet Take 210 mg by mouth 3 (three) times daily. (Patient not taking: Reported on 12/28/2021)   [DISCONTINUED] B Complex-C-Zn-Folic Acid (DIALYVITE 329 WITH ZINC) 0.8 MG TABS Take 1 tablet by mouth daily. (Patient not taking: Reported on 12/28/2021)   [DISCONTINUED] cinacalcet (SENSIPAR) 30 MG tablet Take 30 mg by mouth 2 (two) times daily. (Patient not taking: Reported on 12/28/2021)   [DISCONTINUED] furosemide (LASIX) 40 MG tablet Take 40 mg by mouth. (Patient not taking: Reported on 01/21/2022)   [DISCONTINUED] Methoxy PEG-Epoetin Beta (MIRCERA IJ) Mircera (Patient not taking: Reported on 12/28/2021)   [DISCONTINUED] VELPHORO 500 MG chewable tablet Chew 500 mg by mouth 3 (three) times daily. (Patient not taking: Reported on 12/28/2021)   No facility-administered medications prior to visit.    Review of Systems  Constitutional:  Negative for chills, fever, malaise/fatigue and weight loss.  HENT:  Negative for congestion, ear pain, hearing loss, sinus pain and sore throat.   Eyes:  Negative for blurred vision, photophobia and pain.  Respiratory:  Negative for cough, shortness of breath and wheezing.   Cardiovascular:  Negative for chest pain, palpitations and leg swelling.  Gastrointestinal:  Negative for abdominal pain, constipation, diarrhea, heartburn, nausea and vomiting.  Genitourinary:  Negative for dysuria, frequency and urgency.  Musculoskeletal:  Negative  for falls and neck pain.  Skin:  Negative for itching and rash.  Neurological:  Negative for dizziness, weakness and headaches.  Endo/Heme/Allergies:  Negative for polydipsia. Does not bruise/bleed easily.  Psychiatric/Behavioral:  Negative for depression, substance abuse and suicidal ideas. The patient is not nervous/anxious.      Objective:    BP 117/68 (BP Location: Left Arm, Cuff Size: Normal)   Pulse 77   Resp 20   Ht '5\' 7"'$  (1.702 m)   Wt 142 lb 1.6 oz (64.5 kg)   SpO2 99%   BMI 22.26 kg/m    Physical Exam Constitutional:      General: She is not in acute distress.    Appearance: Normal appearance. She is not ill-appearing.  HENT:     Head: Normocephalic and atraumatic.     Right Ear: Tympanic membrane, ear canal and external ear normal. There is no impacted cerumen.     Left Ear: Tympanic membrane, ear canal and external ear normal. There is no impacted cerumen.     Nose: Nose normal. No congestion or rhinorrhea.     Mouth/Throat:     Mouth: Mucous membranes are moist.     Pharynx: No oropharyngeal exudate or posterior oropharyngeal erythema.  Eyes:     General: No scleral icterus.       Right eye: No discharge.        Left eye: No discharge.     Extraocular Movements: Extraocular movements intact.     Conjunctiva/sclera: Conjunctivae normal.     Pupils: Pupils are equal, round, and reactive to light.  Neck:     Thyroid: No thyromegaly.     Vascular: No carotid bruit or JVD.     Trachea: Trachea normal.  Cardiovascular:     Rate and Rhythm: Normal rate and regular rhythm.     Pulses: Normal pulses.     Heart sounds: Normal heart sounds. No murmur heard.    No friction rub. No gallop.  Pulmonary:     Effort: Pulmonary effort is normal. No respiratory distress.     Breath sounds: Normal breath sounds. No wheezing.  Abdominal:     General: Bowel sounds are normal. There is no distension.     Palpations: Abdomen is soft.     Tenderness: There is no abdominal  tenderness. There is no guarding.  Musculoskeletal:        General: Normal range of motion.     Cervical back: Normal range of motion and neck supple.  Lymphadenopathy:     Cervical: No cervical adenopathy.  Skin:    General: Skin is warm and dry.  Neurological:     Mental Status: She is alert and oriented to person, place, and time.     Cranial Nerves: No cranial nerve deficit.  Psychiatric:        Mood and Affect: Mood normal.        Behavior: Behavior normal.        Thought Content: Thought content normal.  Judgment: Judgment normal.   No results found for any visits on 01/21/22.     Assessment & Plan:    Routine Health Maintenance and Physical Exam  Immunization History  Administered Date(s) Administered   Influenza Inj Mdck Quad Pf 11/02/2018   Influenza, Quadrivalent, Recombinant, Inj, Pf 11/09/2021   Influenza,inj,Quad PF,6+ Mos 11/02/2018, 10/14/2020   Influenza-Unspecified 12/02/2014, 09/18/2020   PFIZER Comirnaty(Gray Top)Covid-19 Tri-Sucrose Vaccine 09/06/2019   PFIZER(Purple Top)SARS-COV-2 Vaccination 09/06/2019, 06/19/2020   Pneumococcal Conjugate-13 05/10/2018   Pneumococcal Polysaccharide-23 12/22/2018    Health Maintenance  Topic Date Due   DTaP/Tdap/Td (1 - Tdap) Never done   Medicare Annual Wellness (AWV)  06/30/2021   COVID-19 Vaccine (4 - 2023-24 season) 02/06/2022 (Originally 09/18/2021)   COLONOSCOPY (Pts 45-13yr Insurance coverage will need to be confirmed)  05/03/2022   MAMMOGRAM  06/12/2023   PAP SMEAR-Modifier  04/30/2024   INFLUENZA VACCINE  Completed   Hepatitis C Screening  Completed   HIV Screening  Completed   HPV VACCINES  Aged Out    Discussed health benefits of physical activity, and encouraged her to engage in regular exercise appropriate for her age and condition.  1. Annual physical exam Labs deferred due to recent transplant follow up monitoring. UTD on preventative care. Wellness information provided with AVS.   Return  in about 1 year (around 01/22/2023) for annual physical exam.     JSamuel Bouche NP

## 2022-02-03 ENCOUNTER — Encounter: Payer: Self-pay | Admitting: Medical-Surgical

## 2022-02-03 ENCOUNTER — Ambulatory Visit (INDEPENDENT_AMBULATORY_CARE_PROVIDER_SITE_OTHER): Payer: Medicare Other | Admitting: Medical-Surgical

## 2022-02-03 VITALS — BP 108/67 | HR 89 | Temp 99.5°F | Resp 20 | Ht 67.0 in | Wt 141.0 lb

## 2022-02-03 DIAGNOSIS — J101 Influenza due to other identified influenza virus with other respiratory manifestations: Secondary | ICD-10-CM | POA: Diagnosis not present

## 2022-02-03 DIAGNOSIS — R0989 Other specified symptoms and signs involving the circulatory and respiratory systems: Secondary | ICD-10-CM

## 2022-02-03 LAB — POCT INFLUENZA A/B
Influenza A, POC: POSITIVE — AB
Influenza B, POC: NEGATIVE

## 2022-02-03 LAB — POCT RAPID STREP A (OFFICE): Rapid Strep A Screen: NEGATIVE

## 2022-02-03 MED ORDER — OSELTAMIVIR PHOSPHATE 75 MG PO CAPS: 75.0000 mg | ORAL_CAPSULE | Freq: Two times a day (BID) | ORAL | 0 refills | Status: DC

## 2022-02-03 NOTE — Progress Notes (Signed)
Established Patient Office Visit  Subjective   Patient ID: Sara Meza, female   DOB: 05-25-1975 Age: 47 y.o. MRN: [id number]   Chief Complaint  Patient presents with   Cough   Sore Throat   Otalgia    HPI Pleasant 47 year old female presenting today with reports of two days of symptoms including rhinorrhea, PND, cough, facial pain, ear pain, and fatigue.   Notes that her ear pain is worse on the left rather than the right.  She had a sore throat on Monday but this is better now.  Came back a little this morning but is gone again.  She had some coffee this morning and had a little bit of diarrhea but wonders if this is related to medication or the coffee intake. She is currently on immunosuppressants for recent kidney transplant is overall been doing well until her symptoms started on Monday. Tried reducing her heating in her home and adding a humidifier but this was not helpful.  Last night, she felt horrible so she took Tylenol 1 tablet which seemed to help some of her overall discomfort.  She is very careful with medications due to her recent transplant and the potential for medication interactions.  Reports that her husband went back to work a couple of weeks ago and is now working in the [facility] ED.  He has been sick recently but has not been tested for anything.  Took a COVID test at home this morning with negative results.   Objective:    Vitals:   02/03/22 1130  BP: 108/67  Pulse: 89  Temp: 99.5 F (37.5 C)  Resp: 20  Height: '5\' 7"'$  (1.702 m)  Weight: 141 lb (64 kg)  SpO2: 99%  BMI (Calculated): 22.08    Physical Exam Vitals and nursing note reviewed.  Constitutional:      General: She is not in acute distress.    Appearance: Normal appearance. She is not ill-appearing.  HENT:     Head: Normocephalic and atraumatic.  Cardiovascular:     Rate and Rhythm: Normal rate and regular rhythm.     Pulses: Normal pulses.     Heart sounds: No murmur heard. Pulmonary:      Effort: Pulmonary effort is normal. No respiratory distress.     Breath sounds: Normal breath sounds. No wheezing, rhonchi or rales.  Skin:    General: Skin is warm and dry.  Neurological:     Mental Status: She is alert and oriented to person, place, and time.  Psychiatric:        Mood and Affect: Mood normal.        Behavior: Behavior normal.        Thought Content: Thought content normal.        Judgment: Judgment normal.    Results for orders placed or performed in visit on 02/03/22 (from the past 24 hour(s))  POCT rapid strep A     Status: None   Collection Time: 02/03/22 11:44 AM  Result Value Ref Range   Rapid Strep A Screen Negative Negative  POCT Influenza A/B     Status: Abnormal   Collection Time: 02/03/22 11:44 AM  Result Value Ref Range   Influenza A, POC Positive (A) Negative   Influenza B, POC Negative Negative       The 10-year ASCVD risk score (Arnett DK, et al., 2019) is: 3.2%   Values used to calculate the score:     Age: 68 years  Sex: Female     Is Non-Hispanic African American: No     Diabetic: Yes     Tobacco smoker: No     Systolic Blood Pressure: 350 mmHg     Is BP treated: Yes     HDL Cholesterol: 39 MG/DL     Total Cholesterol: 207 MG/DL   Assessment & Plan:   1. Respiratory symptoms COVID-negative at home.  POCT influenza a positive.  POCT strep negative. - POCT Influenza A/B - POCT rapid strep A  2. Influenza A Reviewed recommendations for use of Tamiflu and renal failure patients.  We will go ahead and start Tamiflu 75 mg twice daily for 10 days due to immunosuppression.  Advised her to contact her transplant team to verify that this is safe to take with her other medications as they are most aware of her current regimen.  Patient verbalized understanding.  Recommend over-the-counter symptom management with Tylenol, decongestants, etc. per transplant team recommendations.  Return if symptoms worsen or fail to  improve.  ___________________________________________ Clearnce Sorrel, DNP, APRN, FNP-BC [facility]

## 2022-04-20 ENCOUNTER — Encounter: Payer: Self-pay | Admitting: Gastroenterology

## 2022-05-03 ENCOUNTER — Encounter: Payer: Self-pay | Admitting: *Deleted

## 2022-05-12 ENCOUNTER — Encounter: Payer: Self-pay | Admitting: Gastroenterology

## 2022-05-12 ENCOUNTER — Other Ambulatory Visit: Payer: Self-pay | Admitting: Medical-Surgical

## 2022-05-12 ENCOUNTER — Encounter: Payer: Self-pay | Admitting: Hematology

## 2022-05-12 DIAGNOSIS — Z1231 Encounter for screening mammogram for malignant neoplasm of breast: Secondary | ICD-10-CM

## 2022-05-19 ENCOUNTER — Telehealth: Payer: Self-pay | Admitting: Medical-Surgical

## 2022-05-19 NOTE — Telephone Encounter (Signed)
Called patient to schedule Medicare Annual Wellness Visit (AWV). Left message for patient to call back and schedule Medicare Annual Wellness Visit (AWV).  Last date of AWV: 2022  Please schedule an appointment at any time with [facility].  If any questions, please contact me at [phone].  Thank you ,  Morgan Jessup Patient Access Advocate II Direct Dial: [phone]   

## 2022-06-04 ENCOUNTER — Encounter: Payer: Self-pay | Admitting: Hematology

## 2022-06-15 ENCOUNTER — Ambulatory Visit (AMBULATORY_SURGERY_CENTER): Payer: Medicare Other

## 2022-06-15 VITALS — Ht 67.0 in | Wt 155.0 lb

## 2022-06-15 DIAGNOSIS — Z86010 Personal history of colonic polyps: Secondary | ICD-10-CM

## 2022-06-15 DIAGNOSIS — Z800 Family history of malignant neoplasm of digestive organs: Secondary | ICD-10-CM

## 2022-06-15 NOTE — Progress Notes (Signed)
No egg or soy allergy known to patient  No issues known to pt with past sedation with any surgeries or procedures Patient denies ever being told they had issues or difficulty with intubation  No FH of Malignant Hyperthermia Pt is not on diet pills Pt is not on  home 02  Pt is not on blood thinners  Pt denies issues with constipation  No A fib or A flutter Have any cardiac testing pending-- no  Pt is ambulatory   S/p Kidney transplant on immunosuppressant therapy. Pt is scheduled for lab work for her WBC in June. If WBC comes back to low pt will need to cancel her procedure. She will call the office with the results once she obtains them.   Patient's chart reviewed by Cathlyn Parsons CNRA prior to previsit and patient appropriate for the [facility].  Previsit completed and red dot placed by patient's name on their procedure day (on provider's schedule).     PV completed with patient. Prep instructions reviewed and sent via mychart and home address. Pt to pick up all preparations products OTC.

## 2022-06-16 ENCOUNTER — Ambulatory Visit (INDEPENDENT_AMBULATORY_CARE_PROVIDER_SITE_OTHER): Payer: Medicare Other

## 2022-06-16 DIAGNOSIS — Z1231 Encounter for screening mammogram for malignant neoplasm of breast: Secondary | ICD-10-CM

## 2022-07-13 ENCOUNTER — Encounter: Payer: Medicare Other | Admitting: Gastroenterology

## 2023-01-24 ENCOUNTER — Encounter: Payer: Medicare Other | Admitting: Medical-Surgical

## 2023-01-24 ENCOUNTER — Encounter: Payer: Self-pay | Admitting: Medical-Surgical

## 2023-01-24 NOTE — Progress Notes (Deleted)
 Complete physical exam  Patient: Sara Meza   DOB: 10-Jun-1975   48 y.o. Female  MRN: [id number]  Subjective:    No chief complaint on file.   Sara Meza is a 48 y.o. female who presents today for a complete physical exam. She reports consuming a {diet types:17450} diet. {types:19826} She generally feels {DESC; WELL/FAIRLY WELL/POORLY:18703}. She reports sleeping {DESC; WELL/FAIRLY WELL/POORLY:18703}. She {does/does not:200015} have additional problems to discuss today.    Most recent fall risk assessment:    02/03/2022   11:31 AM  Fall Risk   Falls in the past year? 0  Number falls in past yr: 0  Injury with Fall? 0  Risk for fall due to : No Fall Risks  Follow up Falls evaluation completed     Most recent depression screenings:    02/03/2022   11:31 AM 01/21/2022    3:57 PM  PHQ 2/9 Scores  PHQ - 2 Score 0 0    {VISON DENTAL STD PSA (Optional):27386}  {History (Optional):23778}  Patient Care Team: Willo Mini, NP as PCP - General (Nurse Practitioner) Curtis Debby PARAS, MD as Consulting Physician (Sports Medicine) Audrey Mcardle, MD as Consulting Physician (Family Medicine) Melia Lynwood ORN, MD as Consulting Physician (Nephrology)   Outpatient Medications Prior to Visit  Medication Sig   atorvastatin (LIPITOR) 40 MG tablet Take 40 mg by mouth daily.   carvedilol (COREG) 12.5 MG tablet Take 12.5 mg by mouth 2 (two) times daily with a meal. (Patient not taking: Reported on 06/15/2022)   cinacalcet (SENSIPAR) 30 MG tablet Take 30 mg by mouth 3 (three) times a week.   famotidine (PEPCID) 40 MG tablet Take 40 mg by mouth daily. (Patient not taking: Reported on 06/15/2022)   mycophenolate (CELLCEPT) 500 MG tablet Take by mouth 2 (two) times daily.   predniSONE  (DELTASONE ) 20 MG tablet Take 30 mg by mouth daily with breakfast.   sennosides-docusate sodium (SENOKOT-S) 8.6-50 MG tablet Take 1 tablet by mouth daily. (Patient not taking: Reported on 06/15/2022)   sodium  bicarbonate 650 MG tablet Take 650 mg by mouth 2 (two) times daily.   sulfamethoxazole-trimethoprim (BACTRIM DS) 800-160 MG tablet Take 1 tablet by mouth 2 (two) times daily.   tacrolimus (PROGRAF) 5 MG capsule Take 4 mg by mouth daily.   valGANciclovir (VALCYTE) 450 MG tablet Take by mouth daily.   No facility-administered medications prior to visit.    ROS        Objective:     There were no vitals taken for this visit. {Vitals History (Optional):23777}  Physical Exam   No results found for any visits on 01/24/23. {Show previous labs (optional):23779}    Assessment & Plan:    Routine Health Maintenance and Physical Exam  Immunization History  Administered Date(s) Administered   Influenza Inj Mdck Quad Pf 11/02/2018   Influenza, Quadrivalent, Recombinant, Inj, Pf 11/09/2021   Influenza,inj,Quad PF,6+ Mos 11/02/2018, 10/14/2020   Influenza-Unspecified 12/02/2014, 09/18/2020   PFIZER(Purple Top)SARS-COV-2 Vaccination 09/06/2019, 06/19/2020   Pneumococcal Conjugate-13 05/10/2018   Pneumococcal Polysaccharide-23 12/22/2018    Health Maintenance  Topic Date Due   DTaP/Tdap/Td (1 - Tdap) Never done   COVID-19 Vaccine (3 - Pfizer risk series) 07/17/2020   Colonoscopy  05/03/2022   Medicare Annual Wellness (AWV)  06/12/2022   INFLUENZA VACCINE  08/19/2022   MAMMOGRAM  06/15/2024   Cervical Cancer Screening (HPV/Pap Cotest)  05/01/2026   Hepatitis C Screening  Completed   HIV Screening  Completed   HPV VACCINES  Aged Out    Discussed health benefits of physical activity, and encouraged her to engage in regular exercise appropriate for her age and condition.  Problem List Items Addressed This Visit   None Visit Diagnoses       Annual physical exam    -  Primary      No follow-ups on file.     Eugenie Harewood, NP

## 2023-02-03 ENCOUNTER — Encounter: Payer: Self-pay | Admitting: Medical-Surgical

## 2023-02-03 ENCOUNTER — Ambulatory Visit (INDEPENDENT_AMBULATORY_CARE_PROVIDER_SITE_OTHER): Payer: Medicare Other | Admitting: Medical-Surgical

## 2023-02-03 VITALS — BP 114/69 | HR 74 | Resp 20 | Ht 67.0 in | Wt 170.0 lb

## 2023-02-03 DIAGNOSIS — E559 Vitamin D deficiency, unspecified: Secondary | ICD-10-CM | POA: Diagnosis not present

## 2023-02-03 DIAGNOSIS — E782 Mixed hyperlipidemia: Secondary | ICD-10-CM | POA: Diagnosis not present

## 2023-02-03 DIAGNOSIS — Z0000 Encounter for general adult medical examination without abnormal findings: Secondary | ICD-10-CM

## 2023-02-03 NOTE — Progress Notes (Signed)
Complete physical exam  Patient: Sara Meza   DOB: 1975/06/16   48 y.o. Female  MRN: [id number]  Subjective:    Chief Complaint  Patient presents with   Annual Exam   Kareem Didonato is a 48 y.o. female who presents today for a complete physical exam. She reports consuming a general diet.  Doing sit ups, jumping jacks, and push ups for exercise when possible.  She generally feels well. She reports sleeping fairly well. She does not have additional problems to discuss today.    Most recent fall risk assessment:    02/03/2022   11:31 AM  Fall Risk   Falls in the past year? 0  Number falls in past yr: 0  Injury with Fall? 0  Risk for fall due to : No Fall Risks  Follow up Falls evaluation completed     Most recent depression screenings:    02/03/2022   11:31 AM 01/21/2022    3:57 PM  PHQ 2/9 Scores  PHQ - 2 Score 0 0    Vision:Not within last year  and Dental: No current dental problems and No regular dental care     Patient Care Team: Christen Butter, NP as PCP - General (Nurse Practitioner) Monica Becton, MD as Consulting Physician (Sports Medicine) Coralie Carpen, MD as Consulting Physician (Family Medicine) Ethelene Hal, MD as Consulting Physician (Nephrology)   Outpatient Medications Prior to Visit  Medication Sig   cinacalcet (SENSIPAR) 30 MG tablet Take 30 mg by mouth 3 (three) times a week.   mycophenolate (CELLCEPT) 500 MG tablet Take by mouth 2 (two) times daily.   predniSONE (DELTASONE) 20 MG tablet Take 30 mg by mouth daily with breakfast.   tacrolimus (PROGRAF) 5 MG capsule Take 4 mg by mouth daily.   [DISCONTINUED] atorvastatin (LIPITOR) 40 MG tablet Take 40 mg by mouth daily.   [DISCONTINUED] carvedilol (COREG) 12.5 MG tablet Take 12.5 mg by mouth 2 (two) times daily with a meal. (Patient not taking: Reported on 06/15/2022)   [DISCONTINUED] famotidine (PEPCID) 40 MG tablet Take 40 mg by mouth daily. (Patient not taking: Reported on 06/15/2022)    [DISCONTINUED] sennosides-docusate sodium (SENOKOT-S) 8.6-50 MG tablet Take 1 tablet by mouth daily. (Patient not taking: Reported on 06/15/2022)   [DISCONTINUED] sodium bicarbonate 650 MG tablet Take 650 mg by mouth 2 (two) times daily.   [DISCONTINUED] sulfamethoxazole-trimethoprim (BACTRIM DS) 800-160 MG tablet Take 1 tablet by mouth 2 (two) times daily.   [DISCONTINUED] valGANciclovir (VALCYTE) 450 MG tablet Take by mouth daily.   No facility-administered medications prior to visit.   Review of Systems  Constitutional:  Negative for chills, fever, malaise/fatigue and weight loss.  HENT:  Positive for congestion. Negative for ear pain, hearing loss, sinus pain and sore throat.   Eyes:  Negative for blurred vision, photophobia and pain.  Respiratory:  Negative for cough, shortness of breath and wheezing.   Cardiovascular:  Negative for chest pain, palpitations and leg swelling.  Gastrointestinal:  Negative for abdominal pain, constipation, diarrhea, heartburn, nausea and vomiting.  Genitourinary:  Negative for dysuria, frequency and urgency.  Musculoskeletal:  Negative for falls and neck pain.  Skin:  Negative for itching and rash.  Neurological:  Negative for dizziness, weakness and headaches.  Endo/Heme/Allergies:  Negative for polydipsia. Does not bruise/bleed easily.  Psychiatric/Behavioral:  Negative for depression, substance abuse and suicidal ideas. The patient has insomnia. The patient is not nervous/anxious.      Objective:    BP 114/69 (  BP Location: Right Arm, Cuff Size: Normal)   Pulse 74   Resp 20   Ht 5\' 7"  (1.702 m)   Wt 170 lb (77.1 kg)   SpO2 98%   BMI 26.63 kg/m    Physical Exam   No results found for any visits on 02/03/23.     Assessment & Plan:    Routine Health Maintenance and Physical Exam  Immunization History  Administered Date(s) Administered   Hepatitis A, Adult 03/31/2022   Influenza Inj Mdck Quad Pf 11/02/2018   Influenza, Quadrivalent,  Recombinant, Inj, Pf 11/09/2021   Influenza,inj,Quad PF,6+ Mos 11/02/2018, 10/14/2020   Influenza-Unspecified 12/02/2014, 09/18/2020   PFIZER(Purple Top)SARS-COV-2 Vaccination 09/06/2019, 06/19/2020   Pneumococcal Conjugate-13 05/10/2018   Pneumococcal Polysaccharide-23 12/22/2018    Health Maintenance  Topic Date Due   DTaP/Tdap/Td (1 - Tdap) Never done   Colonoscopy  05/03/2022   Medicare Annual Wellness (AWV)  06/12/2022   COVID-19 Vaccine (3 - Pfizer risk series) 02/19/2023 (Originally 07/17/2020)   INFLUENZA VACCINE  04/18/2023 (Originally 08/19/2022)   Pneumococcal Vaccine 70-74 Years old (3 of 3 - PPSV23 or PCV20) 12/22/2023   MAMMOGRAM  06/15/2024   Cervical Cancer Screening (HPV/Pap Cotest)  05/01/2026   Hepatitis C Screening  Completed   HIV Screening  Completed   HPV VACCINES  Aged Out    Discussed health benefits of physical activity, and encouraged her to engage in regular exercise appropriate for her age and condition.  1. Annual physical exam (Primary) Deferring labs as they were recently checked. Recommend updating vision exam, dental care, and colonoscopy. Wellness information provided with AVS.   2. Vitamin D deficiency Continue oral supplementation. Will be checked in a couple of weeks at her follow up at [facility].  3. Mixed hyperlipidemia Previously on Lipitor but did not tolerate. No current statin therapy. Rechecking lipids.  - Lipid panel  Return in about 1 year (around 02/03/2024) for annual physical exam.     Christen Butter, NP

## 2023-02-03 NOTE — Patient Instructions (Signed)
Preventive Care 40-50 Years Old, Female Preventive care refers to lifestyle choices and visits with your health care provider that can promote health and wellness. Preventive care visits are also called wellness exams. What can I expect for my preventive care visit? Counseling Your health care provider may ask you questions about your: Medical history, including: Past medical problems. Family medical history. Pregnancy history. Current health, including: Menstrual cycle. Method of birth control. Emotional well-being. Home life and relationship well-being. Sexual activity and sexual health. Lifestyle, including: Alcohol, nicotine or tobacco, and drug use. Access to firearms. Diet, exercise, and sleep habits. Work and work environment. Sunscreen use. Safety issues such as seatbelt and bike helmet use. Physical exam Your health care provider will check your: Height and weight. These may be used to calculate your BMI (body mass index). BMI is a measurement that tells if you are at a healthy weight. Waist circumference. This measures the distance around your waistline. This measurement also tells if you are at a healthy weight and may help predict your risk of certain diseases, such as type 2 diabetes and high blood pressure. Heart rate and blood pressure. Body temperature. Skin for abnormal spots. What immunizations do I need?  Vaccines are usually given at various ages, according to a schedule. Your health care provider will recommend vaccines for you based on your age, medical history, and lifestyle or other factors, such as travel or where you work. What tests do I need? Screening Your health care provider may recommend screening tests for certain conditions. This may include: Lipid and cholesterol levels. Diabetes screening. This is done by checking your blood sugar (glucose) after you have not eaten for a while (fasting). Pelvic exam and Pap test. Hepatitis B test. Hepatitis C  test. HIV (human immunodeficiency virus) test. STI (sexually transmitted infection) testing, if you are at risk. Lung cancer screening. Colorectal cancer screening. Mammogram. Talk with your health care provider about when you should start having regular mammograms. This may depend on whether you have a family history of breast cancer. BRCA-related cancer screening. This may be done if you have a family history of breast, ovarian, tubal, or peritoneal cancers. Bone density scan. This is done to screen for osteoporosis. Talk with your health care provider about your test results, treatment options, and if necessary, the need for more tests. Follow these instructions at home: Eating and drinking  Eat a diet that includes fresh fruits and vegetables, whole grains, lean protein, and low-fat dairy products. Take vitamin and mineral supplements as recommended by your health care provider. Do not drink alcohol if: Your health care provider tells you not to drink. You are pregnant, may be pregnant, or are planning to become pregnant. If you drink alcohol: Limit how much you have to 0-1 drink a day. Know how much alcohol is in your drink. In the [place] one drink equals one 12 oz bottle of beer (355 mL), one 5 oz glass of wine (148 mL), or one 1 oz glass of hard liquor (44 mL). Lifestyle Brush your teeth every morning and night with fluoride toothpaste. Floss one time each day. Exercise for at least 30 minutes 5 or more days each week. Do not use any products that contain nicotine or tobacco. These products include cigarettes, chewing tobacco, and vaping devices, such as e-cigarettes. If you need help quitting, ask your health care provider. Do not use drugs. If you are sexually active, practice safe sex. Use a condom or other form of protection to   prevent STIs. If you do not wish to become pregnant, use a form of birth control. If you plan to become pregnant, see your health care provider for a  prepregnancy visit. Take aspirin only as told by your health care provider. Make sure that you understand how much to take and what form to take. Work with your health care provider to find out whether it is safe and beneficial for you to take aspirin daily. Find healthy ways to manage stress, such as: Meditation, yoga, or listening to music. Journaling. Talking to a trusted person. Spending time with friends and family. Minimize exposure to UV radiation to reduce your risk of skin cancer. Safety Always wear your seat belt while driving or riding in a vehicle. Do not drive: If you have been drinking alcohol. Do not ride with someone who has been drinking. When you are tired or distracted. While texting. If you have been using any mind-altering substances or drugs. Wear a helmet and other protective equipment during sports activities. If you have firearms in your house, make sure you follow all gun safety procedures. Seek help if you have been physically or sexually abused. What's next? Visit your health care provider once a year for an annual wellness visit. Ask your health care provider how often you should have your eyes and teeth checked. Stay up to date on all vaccines. This information is not intended to replace advice given to you by your health care provider. Make sure you discuss any questions you have with your health care provider. Document Revised: 07/02/2020 Document Reviewed: 07/02/2020 Elsevier Patient Education  2024 [employer] Inc[employer]  

## 2023-02-04 ENCOUNTER — Encounter: Payer: Self-pay | Admitting: Medical-Surgical

## 2023-02-04 DIAGNOSIS — E782 Mixed hyperlipidemia: Secondary | ICD-10-CM

## 2023-02-04 LAB — LIPID PANEL
Chol/HDL Ratio: 4.6 {ratio} — ABNORMAL HIGH (ref 0.0–4.4)
Cholesterol, Total: 263 mg/dL — ABNORMAL HIGH (ref 100–199)
HDL: 57 mg/dL (ref >39)
LDL Chol Calc (NIH): 164 mg/dL — ABNORMAL HIGH (ref 0–99)
Triglycerides: 230 mg/dL — ABNORMAL HIGH (ref 0–149)
VLDL Cholesterol Cal: 42 mg/dL — ABNORMAL HIGH (ref 5–40)

## 2023-02-07 MED ORDER — PRAVASTATIN SODIUM 10 MG PO TABS: 10.0000 mg | ORAL_TABLET | Freq: Every day | ORAL | 3 refills | Status: DC

## 2023-02-24 ENCOUNTER — Telehealth: Payer: Self-pay

## 2023-02-24 ENCOUNTER — Encounter: Payer: Self-pay | Admitting: Medical-Surgical

## 2023-02-24 NOTE — Telephone Encounter (Signed)
 Copied from CRM [id number]. Topic: Clinical - Prescription Issue >> Feb 24, 2023  2:12 PM Delon DASEN wrote: Reason for CRM: pravastatin  (PRAVACHOL ) 10 MG tablet - received call from pharmacy about medication being recalled, need call for alternate medication  [phone]

## 2023-02-28 MED ORDER — LOVASTATIN 10 MG PO TABS: 10.0000 mg | ORAL_TABLET | Freq: Every day | ORAL | 1 refills | Status: DC

## 2023-03-19 ENCOUNTER — Encounter: Payer: Self-pay | Admitting: Medical-Surgical

## 2023-03-19 LAB — LIPID PANEL
Chol/HDL Ratio: 3.8 ratio (ref 0.0–4.4)
Cholesterol, Total: 233 mg/dL — ABNORMAL HIGH (ref 100–199)
HDL: 61 mg/dL (ref >39)
LDL Chol Calc (NIH): 146 mg/dL — ABNORMAL HIGH (ref 0–99)
Triglycerides: 148 mg/dL (ref 0–149)
VLDL Cholesterol Cal: 26 mg/dL (ref 5–40)

## 2023-03-19 LAB — CMP14+EGFR
ALT: 20 IU/L (ref 0–32)
AST: 17 IU/L (ref 0–40)
Albumin: 4.6 g/dL (ref 3.9–4.9)
Alkaline Phosphatase: 94 IU/L (ref 44–121)
BUN/Creatinine Ratio: 18 (ref 9–23)
BUN: 19 mg/dL (ref 6–24)
Bilirubin Total: 0.5 mg/dL (ref 0.0–1.2)
CO2: 20 mmol/L (ref 20–29)
Calcium: 9.2 mg/dL (ref 8.7–10.2)
Chloride: 105 mmol/L (ref 96–106)
Creatinine, Ser: 1.05 mg/dL — ABNORMAL HIGH (ref 0.57–1.00)
Globulin, Total: 2.3 g/dL (ref 1.5–4.5)
Glucose: 109 mg/dL — ABNORMAL HIGH (ref 70–99)
Potassium: 3.9 mmol/L (ref 3.5–5.2)
Sodium: 144 mmol/L (ref 134–144)
Total Protein: 6.9 g/dL (ref 6.0–8.5)
eGFR: 65 mL/min/{1.73_m2} (ref >59)

## 2023-03-22 MED ORDER — LOVASTATIN 20 MG PO TABS: 20.0000 mg | ORAL_TABLET | Freq: Every day | ORAL | 3 refills | Status: AC

## 2023-04-16 IMAGING — US US ABDOMEN COMPLETE
1 series · 14 of 25 positions shown · non-contrast
Comparison: None.

CLINICAL DATA: Abdominal pain

Renal failure
Dialysis
EXAM:
ABDOMEN ULTRASOUND COMPLETE

[Series 1: us abdomen complete · 14 of 59 slices shown]
[im 1/59]
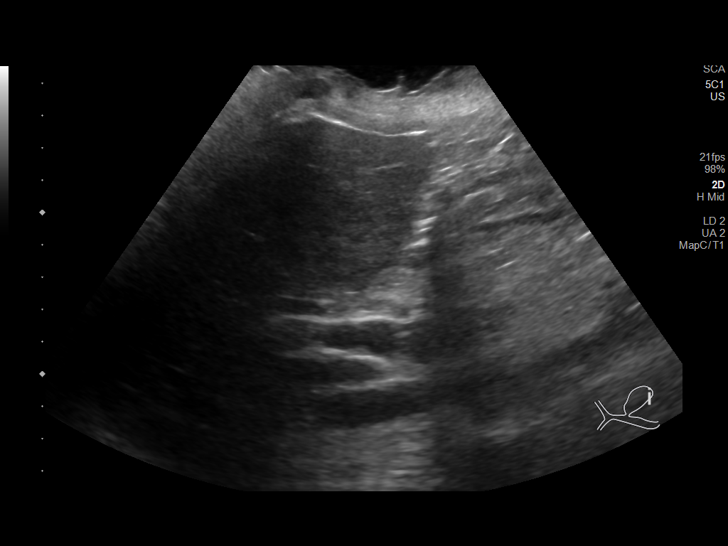
[im 5/59]
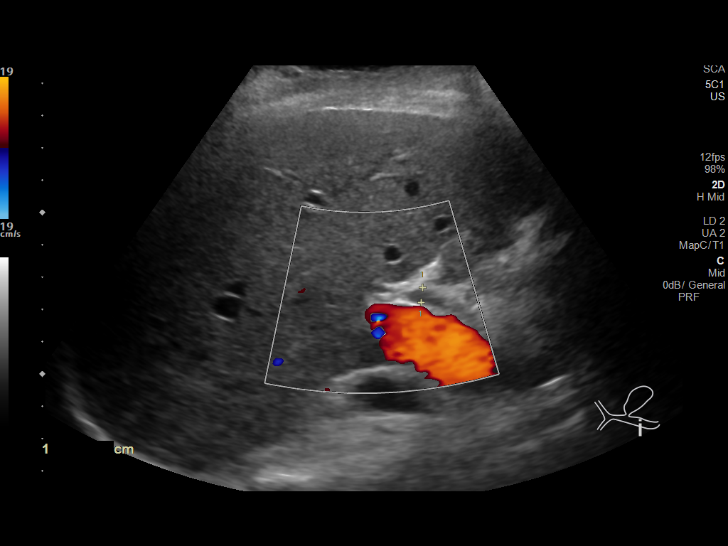
[im 10/59]
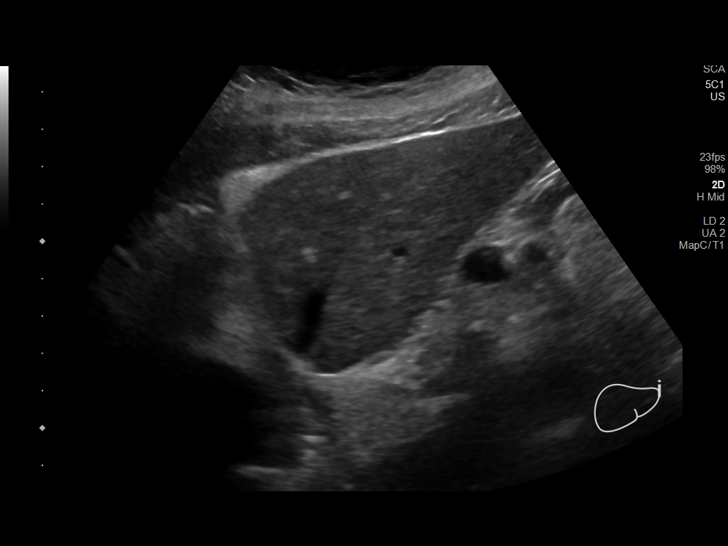
[im 15/59]
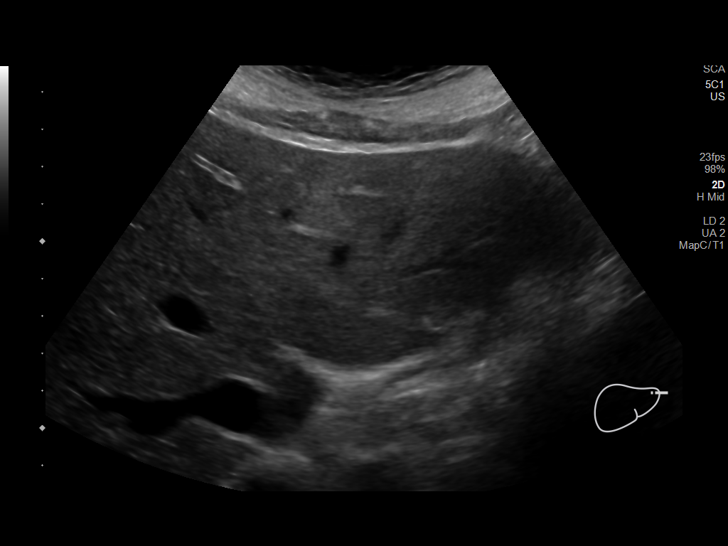
[im 20/59]
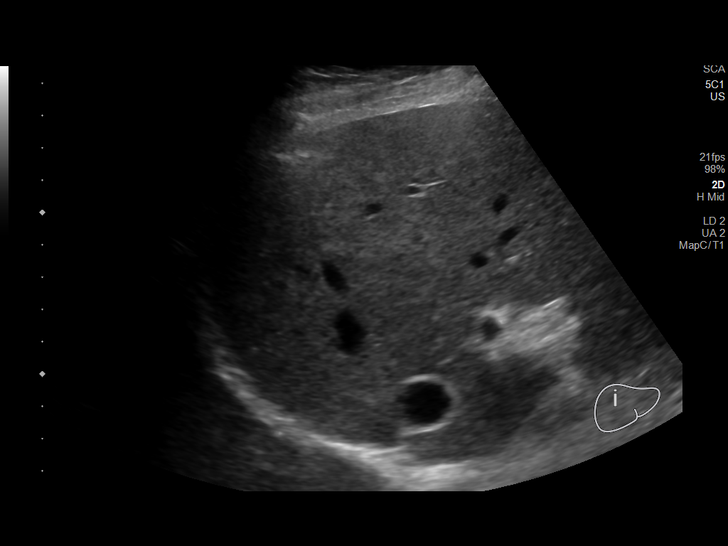
[im 22/59]
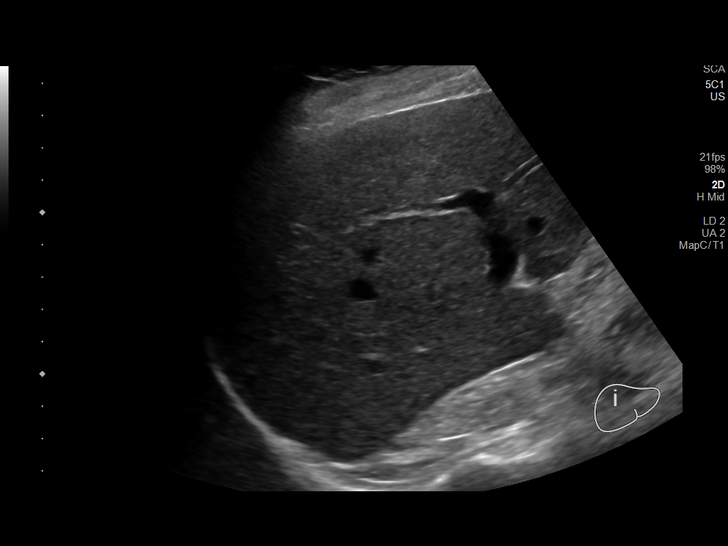
[im 27/59]
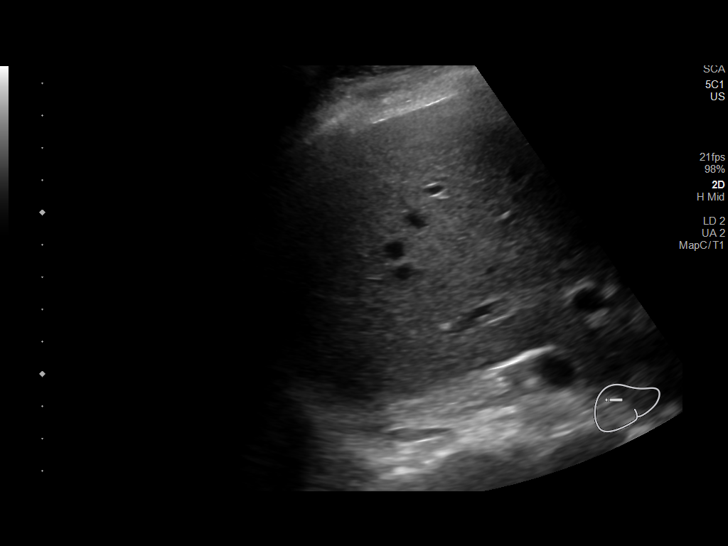
[im 32/59]
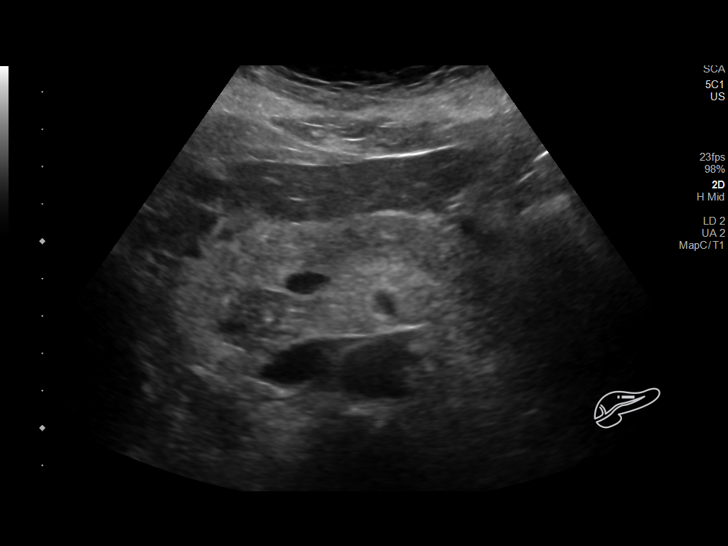
[im 37/59]
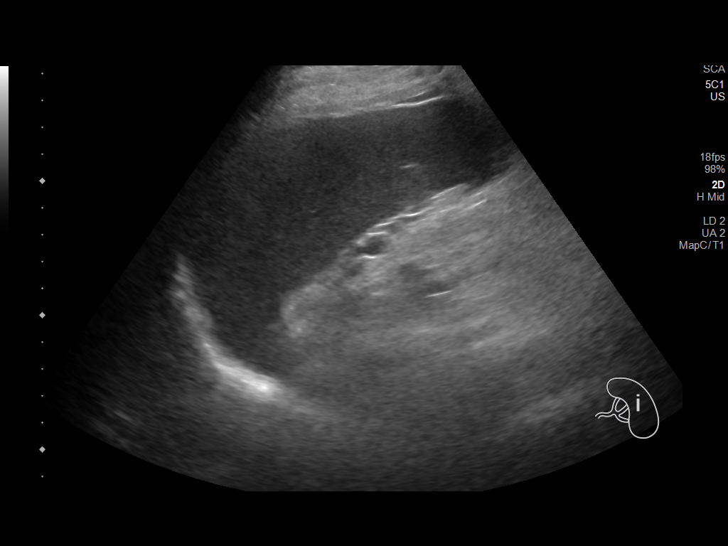
[im 39/59]
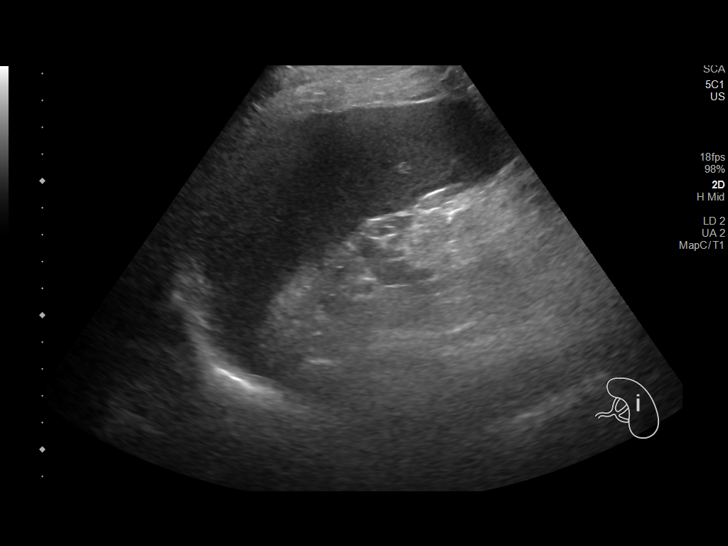
[im 44/59]
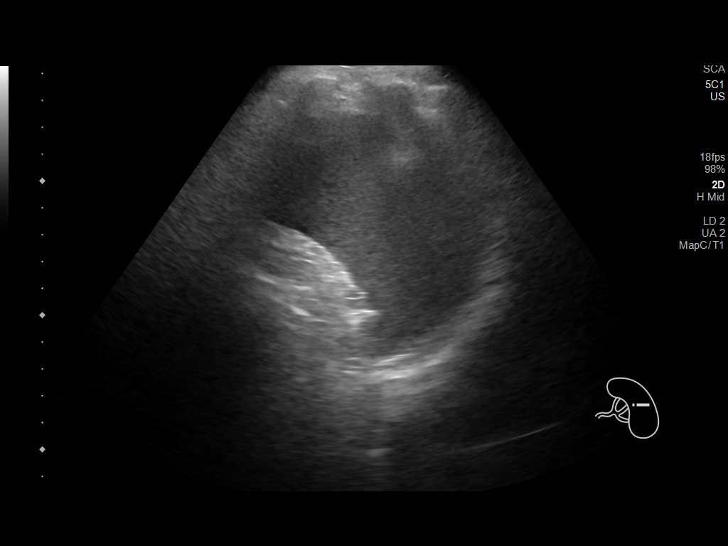
[im 49/59]
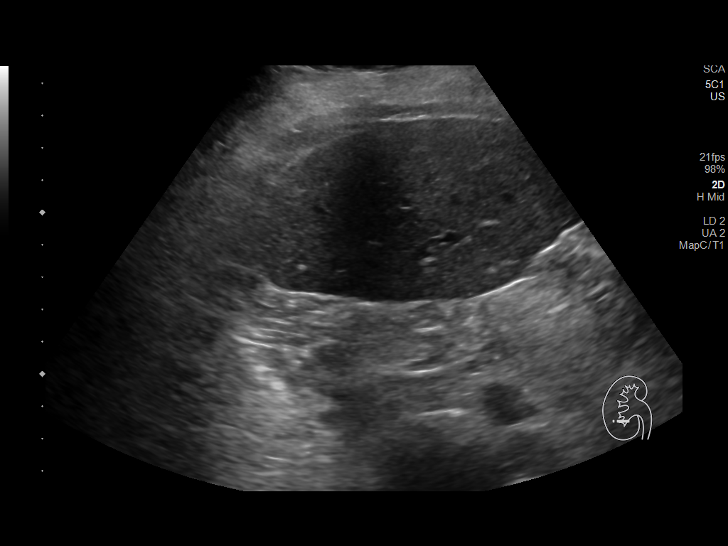
[im 54/59]
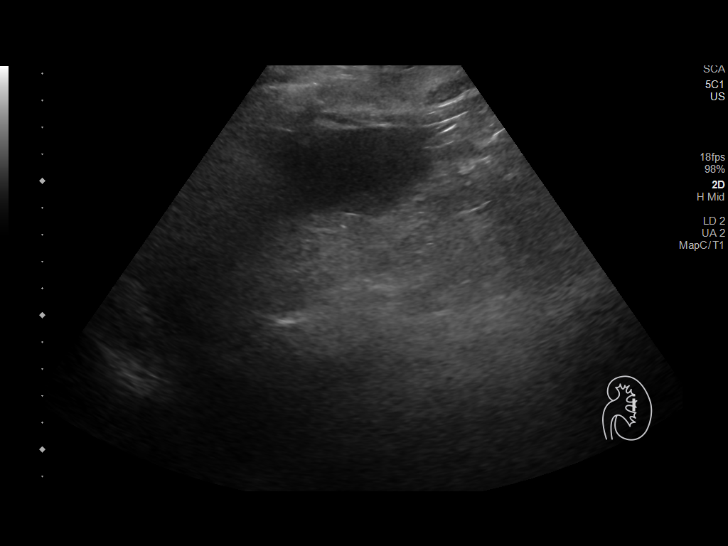
[im 59/59]
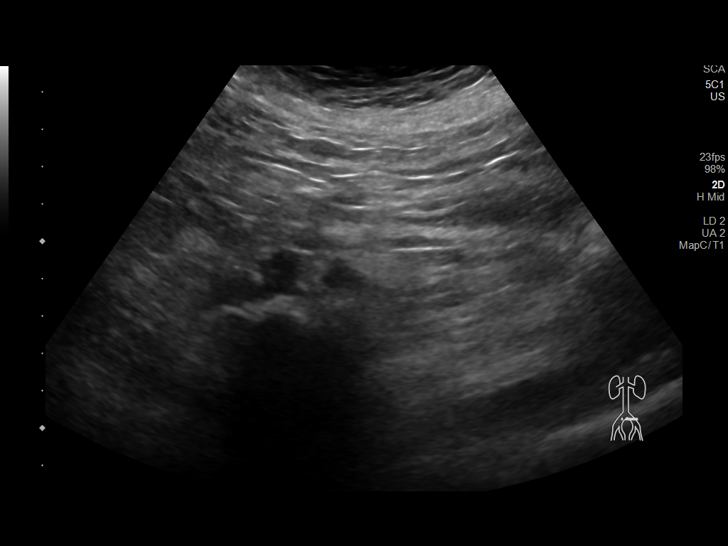

[14 of 25 positions shown; findings below may reference images not displayed]

FINDINGS: Gallbladder: Status post cholecystectomy

Common bile duct: Diameter: 5 mm

Liver: No focal lesion identified. Within normal limits in
parenchymal echogenicity. Portal vein is patent on color Doppler
imaging with normal direction of blood flow towards the liver.

IVC: No abnormality visualized.

Pancreas: Visualized portion unremarkable.

Spleen: Size and appearance within normal limits.

Right Kidney: Length: 7.3 cm. Diffusely echogenic, atrophied cortex.
No hydronephrosis.

Left Kidney: Length: 5.7 cm. Diffusely echogenic, atrophied cortex.
No hydronephrosis.

Abdominal aorta: No aneurysm visualized.

Other findings: None.
IMPRESSION: Diffusely atrophied kidneys consistent with chronic kidney disease.
Otherwise no significant abnormality of the abdomen.

## 2023-05-29 DIAGNOSIS — K5792 Diverticulitis of intestine, part unspecified, without perforation or abscess without bleeding: Secondary | ICD-10-CM | POA: Insufficient documentation

## 2023-06-06 ENCOUNTER — Ambulatory Visit (INDEPENDENT_AMBULATORY_CARE_PROVIDER_SITE_OTHER): Admitting: Medical-Surgical

## 2023-06-06 ENCOUNTER — Encounter: Payer: Self-pay | Admitting: Medical-Surgical

## 2023-06-06 VITALS — BP 124/72 | HR 72 | Resp 20 | Ht 67.0 in | Wt 165.0 lb

## 2023-06-06 DIAGNOSIS — Z09 Encounter for follow-up examination after completed treatment for conditions other than malignant neoplasm: Secondary | ICD-10-CM | POA: Diagnosis not present

## 2023-06-06 DIAGNOSIS — I10 Essential (primary) hypertension: Secondary | ICD-10-CM | POA: Diagnosis not present

## 2023-06-06 DIAGNOSIS — Z940 Kidney transplant status: Secondary | ICD-10-CM | POA: Diagnosis not present

## 2023-06-06 DIAGNOSIS — K5792 Diverticulitis of intestine, part unspecified, without perforation or abscess without bleeding: Secondary | ICD-10-CM | POA: Diagnosis not present

## 2023-06-06 NOTE — Progress Notes (Signed)
        Established patient visit  History, exam, impression, and plan:  1. Hospital discharge follow-up (Primary) 2. Diverticulitis Plan 48 year old female presenting today for hospital discharge after being admitted to [facility] on 05/29/2023 with a diagnosis of diverticulitis.  She noted at least 1 week of abdominal bloating, distention, and pain that progressively worsened.  She was thought it was excessive gassiness however it did not get better.  She was evaluated in the ED and due to her history of kidney failure with current transplant, she was started on IV antibiotics and kept inpatient for monitoring.  She was discharged on 05/31/2023 on oral antibiotics which she will complete tomorrow.  Today reports that her symptoms are completely resolved and she is feeling much better than she was.  Tolerating the oral antibiotics without difficulty.  No further abdominal pain, bloating, or distention.  Cardiopulmonary exam is normal.  Abdominal exam shows soft abdomen, nontender, active bowel sounds throughout.  Answered patient's questions regarding diverticulitis and what causes it.  Information provided with her AVS for further review and prevention of recurrence.  3. Hypertension goal BP (blood pressure) < 130/80 She does have a history of hypertension that is currently well-managed without medications.  Blood pressure 124/72 today, at goal.  Frequent doctors appointments and intermittent checks continue to show well-controlled blood pressure.  No changes today.  4. Kidney transplant recipient She is followed by [facility] for monitoring of her kidney transplant.  She reports that they put orders in presenting to have labs done however the orders were not complete showing in the system.  Including CBC with differential, urinalysis, and BK quantitative to ensure a full panel per their prior instructions to her. - CBC with Differential/Platelet - Urinalysis, Routine w reflex microscopic - BK  Quant PCR (Plasma/Serum)  Procedures performed this visit: None.  Return if symptoms worsen or fail to improve.  __________________________________ Maryl Snook, DNP, APRN, FNP-BC Primary Care and Sports Medicine [facility] [place]

## 2023-06-06 NOTE — Patient Instructions (Signed)
Diverticulitis  Diverticulitis happens when poop (stool) and bacteria get trapped in small pouches in the colon called diverticula. These pouches may form if you have a condition called diverticulosis. When the poop and bacteria get trapped, it can cause an infection and inflammation. Diverticulitis may cause severe stomach pain and diarrhea. It can also lead to tissue damage in your colon. This can cause bleeding or blockage. In some cases, the diverticula may burst (rupture). This can cause infected poop to go into other parts of your abdomen. What are the causes? This condition is caused by poop getting trapped in the diverticula. This allows bacteria to grow. It can lead to inflammation and infection. What increases the risk? You are more likely to get this condition if you have diverticulosis. You are also more at risk if: You are overweight or obese. You do not get enough exercise. You drink alcohol. You smoke. You eat a lot of red meat, such as beef, pork, or lamb. You do not get enough fiber. Foods high in fiber include fruits, vegetables, beans, nuts, and whole grains. You are over 35 years of age. What are the signs or symptoms? Symptoms of this condition may include: Pain and tenderness in the abdomen. This pain is often felt on the left side but may occur in other spots. Fever and chills. Nausea and vomiting. Cramping. Bloating. Changes in how often you poop. Blood in your poop. How is this diagnosed? This condition is diagnosed based on your medical history and a physical exam. You may also have tests done to make sure there is nothing else causing your condition. These tests may include: Blood tests. Tests done on your pee (urine). A CT scan of the abdomen. You may need to have a colonoscopy. This is an exam to look at your whole large intestine. During the exam, a tube is put into the opening of your butt (anus) and then moved into your rectum, colon, and other parts of  the large intestine. This exam is done to look at the diverticula. It can also see if there is something else that may be causing your symptoms. How is this treated? Most cases are mild and can be treated at home. You may be told to: Take over-the-counter pain medicine. Only eat and drink clear liquids. Take antibiotics. Rest. More severe cases may need to be treated at a hospital. Treatment may include: Not eating or drinking. Taking pain medicines. Getting antibiotics through an IV. Getting fluids and nutrition through an IV. Surgery. Follow these instructions at home: Medicines Take over-the-counter and prescription medicines only as told by your health care provider. These include fiber supplements, probiotics, and medicines to soften your poop (stool softeners). If you were prescribed antibiotics, take them as told by your provider. Do not stop using the antibiotic even if you start to feel better. Ask your provider if the medicine prescribed to you requires you to avoid driving or using machinery. Eating and drinking  Follow the diet told by your provider. You may need to only eat and drink liquids. After your symptoms get better, you may be able to return to a more normal diet. You may be told to eat at least 25 grams (25 g) of fiber each day. Fiber makes it easier to poop. Healthy sources of fiber include: Berries. One cup has 4-8 g of fiber. Beans or lentils. One-half cup has 5-8 g of fiber. Green vegetables. One cup has 4 g of fiber. Avoid eating red meat.  General instructions Do not use any products that contain nicotine or tobacco. These products include cigarettes, chewing tobacco, and vaping devices, such as e-cigarettes. If you need help quitting, ask your provider. Exercise for at least 30 minutes, 3 times a week. Exercise hard enough to raise your heart rate and break a sweat. Contact a health care provider if: Your pain gets worse. Your pooping does not go back to  normal. Your symptoms do not get better with treatment. Your symptoms get worse all of a sudden. You have a fever. You vomit more than one time. Your poop is bloody, black, or tarry. This information is not intended to replace advice given to you by your health care provider. Make sure you discuss any questions you have with your health care provider. Document Revised: 10/01/2021 Document Reviewed: 10/01/2021 Elsevier Patient Education  2024 [employer].

## 2023-06-07 ENCOUNTER — Ambulatory Visit: Payer: Self-pay | Admitting: Medical-Surgical

## 2023-06-07 LAB — MICROSCOPIC EXAMINATION
Bacteria, UA: NONE SEEN
Casts: NONE SEEN /LPF
Epithelial Cells (non renal): NONE SEEN /HPF (ref 0–10)
RBC, Urine: NONE SEEN /HPF (ref 0–2)
WBC, UA: NONE SEEN /HPF (ref 0–5)

## 2023-06-07 LAB — CBC WITH DIFFERENTIAL/PLATELET

## 2023-06-07 LAB — URINALYSIS, ROUTINE W REFLEX MICROSCOPIC
Bilirubin, UA: NEGATIVE
Glucose, UA: NEGATIVE
Ketones, UA: NEGATIVE
Leukocytes,UA: NEGATIVE
Nitrite, UA: POSITIVE — AB
Specific Gravity, UA: 1.023 (ref 1.005–1.030)
Urobilinogen, Ur: 0.2 mg/dL (ref 0.2–1.0)
pH, UA: 5 (ref 5.0–7.5)

## 2023-06-08 LAB — BK QUANT PCR (PLASMA/SERUM): BKV DNA, Quant PCR, Plasma: NEGATIVE [IU]/mL

## 2023-08-01 ENCOUNTER — Other Ambulatory Visit: Payer: Self-pay | Admitting: Medical-Surgical

## 2023-08-01 DIAGNOSIS — Z1231 Encounter for screening mammogram for malignant neoplasm of breast: Secondary | ICD-10-CM

## 2023-09-01 ENCOUNTER — Ambulatory Visit

## 2023-09-22 ENCOUNTER — Ambulatory Visit

## 2023-09-22 VITALS — Ht 66.5 in | Wt 168.0 lb

## 2023-09-22 DIAGNOSIS — Z0000 Encounter for general adult medical examination without abnormal findings: Secondary | ICD-10-CM | POA: Diagnosis not present

## 2023-09-22 NOTE — Progress Notes (Signed)
 Subjective:   Analayah Brooke is a 48 y.o. female who presents for Medicare Annual (Subsequent) preventive examination.  Visit Complete: Virtual I connected with  Vernell Fiscal on 09/22/23 by a audio enabled telemedicine application and verified that I am speaking with the correct person using two identifiers.  Patient Location: Home  Provider Location: Office/Clinic  I discussed the limitations of evaluation and management by telemedicine. The patient expressed understanding and agreed to proceed.  Vital Signs: Because this visit was a virtual/telehealth visit, some criteria may be missing or patient reported. Any vitals not documented were not able to be obtained and vitals that have been documented are patient reported.  Patient Medicare AWV questionnaire was completed by the patient on n/a; I have confirmed that all information answered by patient is correct and no changes since this date.  Cardiac Risk Factors include: dyslipidemia     Objective:    Today's Vitals   09/22/23 1347  Weight: 168 lb (76.2 kg)  Height: 5' 6.5 (1.689 m)   Body mass index is 26.71 kg/m.     09/22/2023    2:07 PM 06/30/2020   10:40 AM 04/27/2019    9:06 AM 09/13/2018    8:43 AM 09/12/2018    8:26 AM 05/15/2018    2:49 PM 05/15/2018   11:53 AM  Advanced Directives  Does Patient Have a Medical Advance Directive? Yes Yes Yes Yes Yes No No  Type of Advance Directive Healthcare Power of Attorney Living will;Healthcare Power of [employer] Power of [employer] Power of Attorney Healthcare Power of Attorney    Does patient want to make changes to medical advance directive? No - Patient declined No - Patient declined  No - Patient declined No - Patient declined    Copy of Healthcare Power of Attorney in Chart?  No - copy requested No - copy requested No - copy requested No - copy requested    Would patient like information on creating a medical advance directive?   No - Patient declined   No  - Patient declined       Data saved with a previous flowsheet row definition    Current Medications (verified) Outpatient Encounter Medications as of 09/22/2023  Medication Sig   cinacalcet (SENSIPAR) 30 MG tablet Take 30 mg by mouth 3 (three) times a week.   mycophenolate (CELLCEPT) 500 MG tablet Take by mouth 2 (two) times daily.   predniSONE  (DELTASONE ) 20 MG tablet Take 30 mg by mouth daily with breakfast.   tacrolimus (PROGRAF) 5 MG capsule Take 4 mg by mouth daily.   lovastatin  (MEVACOR ) 20 MG tablet Take 1 tablet (20 mg total) by mouth at bedtime. (Patient not taking: Reported on 09/22/2023)   No facility-administered encounter medications on file as of 09/22/2023.    Allergies (verified) Atorvastatin, Azithromycin, Camellia, Temazepam , and Lisinopril    History: Past Medical History:  Diagnosis Date   Anaphylactic shock, unspecified, initial encounter 11/06/2019   Anemia    Chronic kidney disease 02/22/2018   ESRD-dialysis, training to do dialysis at home. Currently does dialysis every day.    CKD (chronic kidney disease), stage V (HCC) 02/23/2018   Dependence on renal dialysis (HCC) 11/06/2019   Dialysis patient [facility])    Diarrhea due to drug 07/24/2018   ESRD (end stage renal disease) on dialysis (HCC) 03/08/2018   ESRD on peritoneal dialysis (HCC) 05/28/2019   Family history of colon cancer requiring screening colonoscopy 02/22/2018   GERD (gastroesophageal reflux disease)  Gestational diabetes 09/25/2019   Hemolacria    History of D&C 11/07/2018   History of gestational diabetes 06/29/2012   History of varicose veins of lower extremity 02/22/2018   Hypertension    Rash and nonspecific skin eruption 02/22/2018   Status post tubal ligation 02/21/2018   Past Surgical History:  Procedure Laterality Date   AV FISTULA PLACEMENT Right 03/02/2018   Procedure: ARTERIOVENOUS (AV) FISTULA CREATION;  Surgeon: Sheree Penne Bruckner, MD;  Location: [facility] OR;  Service:  Vascular;  Laterality: Right;   ENDOVENOUS ABLATION SAPHENOUS VEIN W/ LASER     HYSTEROSCOPY WITH D & C N/A 09/13/2018   Procedure: DILATATION AND CURETTAGE WITH MINERVA ABLATION;  Surgeon: Starla Harland BROCKS, MD;  Location: [facility] OR;  Service: Gynecology;  Laterality: N/A;  Rep will be here confirmed on 09/06/18 CS   IR FLUORO GUIDE CV LINE RIGHT  02/24/2018   IR US  GUIDE VASC ACCESS RIGHT  02/24/2018   KIDNEY TRANSPLANT     LAPAROSCOPY     tenckhoff catheter removal  10/28/2019   TUBAL LIGATION     Family History  Problem Relation Age of Onset   Diabetes Father    Hypertension Father    Diabetes Mother    Hypertension Mother    Colon cancer Sister 20   Wilm's tumor Sister    Colon polyps Neg Hx    Esophageal cancer Neg Hx    Rectal cancer Neg Hx    Stomach cancer Neg Hx    Social History   Socioeconomic History   Marital status: Married    Spouse name: [name]   Number of children: 3   Years of education: 12   Highest education level: 12th grade  Occupational History   Occupation: homemaker    Comment: disabled  Tobacco Use   Smoking status: Never   Smokeless tobacco: Never  Vaping Use   Vaping status: Never Used  Substance and Sexual Activity   Alcohol use: Not Currently   Drug use: Never   Sexual activity: Yes    Birth control/protection: Surgical, None  Other Topics Concern   Not on file  Social History Narrative   Lives with her husband and one of her children. She likes to clean her house in her free time.   Social Drivers of Health   Financial Resource Strain: Medium Risk (09/22/2023)   Overall Financial Resource Strain (CARDIA)    Difficulty of Paying Living Expenses: Somewhat hard  Food Insecurity: No Food Insecurity (09/22/2023)   Hunger Vital Sign    Worried About Running Out of Food in the Last Year: Never true    Ran Out of Food in the Last Year: Never true  Transportation Needs: No Transportation Needs (09/22/2023)   PRAPARE - Scientist, research (physical sciences) (Medical): No    Lack of Transportation (Non-Medical): No  Physical Activity: Sufficiently Active (09/22/2023)   Exercise Vital Sign    Days of Exercise per Week: 7 days    Minutes of Exercise per Session: 50 min  Stress: No Stress Concern Present (09/22/2023)   [employer] of Occupational Health - Occupational Stress Questionnaire    Feeling of Stress: Not at all  Social Connections: Socially Isolated (09/22/2023)   Social Connection and Isolation Panel    Frequency of Communication with Friends and Family: Never    Frequency of Social Gatherings with Friends and Family: Never    Attends Religious Services: Never    Database administrator or Organizations:  No    Attends Banker Meetings: Never    Marital Status: Married    Tobacco Counseling Counseling given: Not Answered   Clinical Intake:  Pre-visit preparation completed: Yes  Pain : No/denies pain     BMI - recorded: 26.71 Nutritional Status: BMI 25 -29 Overweight Nutritional Risks: None Diabetes: No  How often do you need to have someone help you when you read instructions, pamphlets, or other written materials from your doctor or pharmacy?: 1 - Never What is the last grade level you completed in school?: 11  Interpreter Needed?: No      Activities of Daily Living    09/22/2023    1:49 PM  In your present state of health, do you have any difficulty performing the following activities:  Hearing? 0  Vision? 0  Difficulty concentrating or making decisions? 0  Walking or climbing stairs? 1  Dressing or bathing? 0  Doing errands, shopping? 0  Preparing Food and eating ? N  Using the Toilet? N  In the past six months, have you accidently leaked urine? N  Do you have problems with loss of bowel control? N  Managing your Medications? N  Managing your Finances? N  Housekeeping or managing your Housekeeping? N    Patient Care Team: Willo Mini, NP as PCP - General (Nurse  Practitioner) Audrey Mcardle, MD as Consulting Physician (Family Medicine) Melia Lynwood ORN, MD as Consulting Physician (Nephrology) Alto Cough, MD as Attending Physician (Internal Medicine) Ransom Marsh, MD as Referring Physician (Psychiatry)  Indicate any recent Medical Services you may have received from other than [facility] providers in the past year (date may be approximate).     Assessment:   This is a routine wellness examination for Addeline.  Hearing/Vision screen No results found.   Goals Addressed             This Visit's Progress    Patient Stated       Patient states to lose 10 more pounds.        Depression Screen    09/22/2023    2:06 PM 06/06/2023   10:31 AM 02/03/2022   11:31 AM 01/21/2022    3:57 PM 09/24/2020   10:21 AM 06/30/2020   10:41 AM 02/21/2018    2:59 PM  PHQ 2/9 Scores  PHQ - 2 Score 0 0 0 0 4 0 0  PHQ- 9 Score     16  3    Fall Risk    09/22/2023    2:08 PM 02/03/2022   11:31 AM 09/24/2020   10:23 AM 06/30/2020   10:40 AM  Fall Risk   Falls in the past year? 0 0 0 0  Number falls in past yr: 0 0 0 0  Injury with Fall? 0 0 0 0  Risk for fall due to : No Fall Risks No Fall Risks No Fall Risks No Fall Risks  Follow up Falls evaluation completed Falls evaluation completed  Falls evaluation completed  Falls evaluation completed      Data saved with a previous flowsheet row definition    MEDICARE RISK AT HOME: Medicare Risk at Home Any stairs in or around the home?: Yes If so, are there any without handrails?: No Home free of loose throw rugs in walkways, pet beds, electrical cords, etc?: Yes Adequate lighting in your home to reduce risk of falls?: Yes Life alert?: No Use of a cane, walker or w/c?: No Grab bars in the bathroom?: Yes  Shower chair or bench in shower?: No Elevated toilet seat or a handicapped toilet?: No  TIMED UP AND GO:  Was the test performed?  No    Cognitive Function:        09/22/2023    2:09 PM 06/30/2020   10:43 AM   6CIT Screen  What Year? 0 points 0 points  What month? 0 points 0 points  What time? 0 points 0 points  Count back from 20 0 points 0 points  Months in reverse 0 points 0 points  Repeat phrase 0 points 0 points  Total Score 0 points 0 points    Immunizations Immunization History  Administered Date(s) Administered   Hepatitis A, Adult 03/31/2022   Influenza Inj Mdck Quad Pf 11/02/2018   Influenza, Quadrivalent, Recombinant, Inj, Pf 11/09/2021   Influenza,inj,Quad PF,6+ Mos 11/02/2018, 10/14/2020   Influenza-Unspecified 12/02/2014, 09/18/2020   PFIZER(Purple Top)SARS-COV-2 Vaccination 09/06/2019, 06/19/2020   Pneumococcal Conjugate-13 05/10/2018   Pneumococcal Polysaccharide-23 12/22/2018    TDAP status: Due, Education has been provided regarding the importance of this vaccine. Advised may receive this vaccine at local pharmacy or Health Dept. Aware to provide a copy of the vaccination record if obtained from local pharmacy or Health Dept. Verbalized acceptance and understanding.  Flu Vaccine status: Due, Education has been provided regarding the importance of this vaccine. Advised may receive this vaccine at local pharmacy or Health Dept. Aware to provide a copy of the vaccination record if obtained from local pharmacy or Health Dept. Verbalized acceptance and understanding.  Pneumococcal vaccine status: Up to date  Covid-19 vaccine status: Declined, Education has been provided regarding the importance of this vaccine but patient still declined. Advised may receive this vaccine at local pharmacy or Health Dept.or vaccine clinic. Aware to provide a copy of the vaccination record if obtained from local pharmacy or Health Dept. Verbalized acceptance and understanding.  Qualifies for Shingles Vaccine? Yes   Zostavax completed No   Shingrix Completed?: No.    Education has been provided regarding the importance of this vaccine. Patient has been advised to call insurance company to  determine out of pocket expense if they have not yet received this vaccine. Advised may also receive vaccine at local pharmacy or Health Dept. Verbalized acceptance and understanding.  Screening Tests Health Maintenance  Topic Date Due   DTaP/Tdap/Td (1 - Tdap) Never done   Hepatitis B Vaccines 19-59 Average Risk (1 of 3 - 19+ 3-dose series) Never done   Zoster Vaccines- Shingrix (1 of 2) Never done   COVID-19 Vaccine (3 - Pfizer risk series) 07/17/2020   Colonoscopy  05/03/2022   INFLUENZA VACCINE  08/19/2023   Pneumococcal Vaccine: 50+ Years (3 of 3 - PCV20 or PCV21) 12/22/2023   MAMMOGRAM  06/15/2024   Medicare Annual Wellness (AWV)  09/21/2024   Cervical Cancer Screening (HPV/Pap Cotest)  05/01/2026   Hepatitis C Screening  Completed   HIV Screening  Completed   HPV VACCINES  Aged Out   Meningococcal B Vaccine  Aged Out    Health Maintenance  Health Maintenance Due  Topic Date Due   DTaP/Tdap/Td (1 - Tdap) Never done   Hepatitis B Vaccines 19-59 Average Risk (1 of 3 - 19+ 3-dose series) Never done   Zoster Vaccines- Shingrix (1 of 2) Never done   COVID-19 Vaccine (3 - Pfizer risk series) 07/17/2020   Colonoscopy  05/03/2022   INFLUENZA VACCINE  08/19/2023   Pneumococcal Vaccine: 50+ Years (3 of 3 - PCV20 or PCV21)  12/22/2023    Colorectal cancer screening: Type of screening: Colonoscopy. Completed 05/03/2019. Repeat every 3 years. Patient is going to make the appointment.   Mammogram status: Completed 06/16/2022. Repeat every year   Lung Cancer Screening: (Low Dose CT Chest recommended if Age 70-80 years, 20 pack-year currently smoking OR have quit w/in 15years.) does not qualify.   Lung Cancer Screening Referral: n/a  Additional Screening:  Hepatitis C Screening: does qualify; Completed 11/02/2019  Vision Screening: Recommended annual ophthalmology exams for early detection of glaucoma and other disorders of the eye. Is the patient up to date with their annual eye  exam?  Yes  Who is the provider or what is the name of the office in which the patient attends annual eye exams? [facility] If pt is not established with a provider, would they like to be referred to a provider to establish care? N/a.   Dental Screening: Recommended annual dental exams for proper oral hygiene   Community Resource Referral / Chronic Care Management: CRR required this visit?  No   CCM required this visit?  No     Plan:     I have personally reviewed and noted the following in the patient's chart:   Medical and social history Use of alcohol, tobacco or illicit drugs  Current medications and supplements including opioid prescriptions. Patient is not currently taking opioid prescriptions. Functional ability and status Nutritional status Physical activity Advanced directives List of other physicians Hospitalizations # 1, surgeries # 0, and ER # 1 visits in previous 12 months Vitals Screenings to include cognitive, depression, and falls Referrals and appointments  In addition, I have reviewed and discussed with patient certain preventive protocols, quality metrics, and best practice recommendations. A written personalized care plan for preventive services as well as general preventive health recommendations were provided to patient.     Bonny Jon Mayor, CMA   09/22/2023   After Visit Summary: (MyChart) Due to this being a telephonic visit, the after visit summary with patients personalized plan was offered to patient via MyChart   Nurse Notes:    Aydan Phoenix is a 48 y.o. female patient of Zada Palin, NP who had a Medicare Annual Wellness Visit today via telephone. She reports that he is socially active and does interact with friends/family regularly. She is moderately physically active. She enjoys cleaning her house in her free time.

## 2023-09-22 NOTE — Patient Instructions (Signed)
  Sara Meza , Thank you for taking time to come for your Medicare Wellness Visit. I appreciate your ongoing commitment to your health goals. Please review the following plan we discussed and let me know if I can assist you in the future.   These are the goals we discussed:  Goals       Patient Stated (pt-stated)      06/30/2020 AWV Goal: Exercise for General Health  Patient will verbalize understanding of the benefits of increased physical activity: Exercising regularly is important. It will improve your overall fitness, flexibility, and endurance. Regular exercise also will improve your overall health. It can help you control your weight, reduce stress, and improve your bone density. Over the next year, patient will increase physical activity as tolerated with a goal of at least 150 minutes of moderate physical activity per week.  You can tell that you are exercising at a moderate intensity if your heart starts beating faster and you start breathing faster but can still hold a conversation. Moderate-intensity exercise ideas include: Walking 1 mile (1.6 km) in about 15 minutes Biking Hiking Golfing Dancing Water aerobics Patient will verbalize understanding of everyday activities that increase physical activity by providing examples like the following: Yard work, such as: Insurance underwriter Gardening Washing windows or floors Patient will be able to explain general safety guidelines for exercising:  Before you start a new exercise program, talk with your health care provider. Do not exercise so much that you hurt yourself, feel dizzy, or get very short of breath. Wear comfortable clothes and wear shoes with good support. Drink plenty of water while you exercise to prevent dehydration or heat stroke. Work out until your breathing and your heartbeat get faster.       Patient Stated      Patient states  to lose 10 more pounds.         This is a list of the screening recommended for you and due dates:  Health Maintenance  Topic Date Due   DTaP/Tdap/Td vaccine (1 - Tdap) Never done   Hepatitis B Vaccine (1 of 3 - 19+ 3-dose series) Never done   Zoster (Shingles) Vaccine (1 of 2) Never done   COVID-19 Vaccine (3 - Pfizer risk series) 07/17/2020   Colon Cancer Screening  05/03/2022   Flu Shot  08/19/2023   Pneumococcal Vaccine for age over 54 (3 of 3 - PCV20 or PCV21) 12/22/2023   Mammogram  06/15/2024   Medicare Annual Wellness Visit  09/21/2024   Pap with HPV screening  05/01/2026   Hepatitis C Screening  Completed   HIV Screening  Completed   HPV Vaccine  Aged Out   Meningitis B Vaccine  Aged Out

## 2023-10-06 ENCOUNTER — Encounter: Payer: Self-pay | Admitting: Hematology

## 2023-10-13 ENCOUNTER — Ambulatory Visit

## 2024-01-04 ENCOUNTER — Encounter: Payer: Self-pay | Admitting: Hematology

## 2024-01-27 ENCOUNTER — Encounter: Payer: Self-pay | Admitting: Hematology

## 2024-02-01 ENCOUNTER — Ambulatory Visit: Payer: Self-pay

## 2024-02-01 NOTE — Telephone Encounter (Signed)
 FYI Only or Action Required?: FYI only for provider: appointment scheduled on 1/15.  Patient was last seen in primary care on 06/06/2023 by Sara Mini, NP.  Called Nurse Triage reporting Abdominal Pain.  Symptoms began yesterday.  Interventions attempted: Nothing.  Symptoms are: stable.  Triage Disposition: See Physician Within 24 Hours  Patient/caregiver understands and will follow disposition?:     Copied from CRM [id number]. Topic: Clinical - Red Word Triage >> Feb 01, 2024 10:22 AM Richerd B wrote: [facility] that prompted transfer to Nurse Triage: Patient has severe pain in stomach Reason for Disposition  [1] MODERATE pain (e.g., interferes with normal activities) AND [2] pain comes and goes (cramps) AND [3] present > 24 hours  (Exception: Pain with Vomiting or Diarrhea - see that Guideline.)  Answer Assessment - Initial Assessment Questions 1. LOCATION: Where does it hurt?      Left med abd 2. RADIATION: Does the pain shoot anywhere else? (e.g., chest, back)      3. ONSET: When did the pain begin? (e.g., minutes, hours or days ago)      yesterday 4. SUDDEN: Gradual or sudden onset?     sudden 5. PATTERN Does the pain come and go, or is it constant?     intermittent 6. SEVERITY: How bad is the pain?  (e.g., Scale 1-10; mild, moderate, or severe)     7/10 7. RECURRENT SYMPTOM: Have you ever had this type of stomach pain before? If Yes, ask: When was the last time? and What happened that time?      yes 8. CAUSE: What do you think is causing the stomach pain? (e.g., gallstones, recent abdominal surgery)     diverticulitis 9. RELIEVING/AGGRAVATING FACTORS: What makes it better or worse? (e.g., antacids, bending or twisting motion, bowel movement)      10. OTHER SYMPTOMS: Do you have any other symptoms? (e.g., back pain, diarrhea, fever, urination pain, vomiting)       denies 11. PREGNANCY: Is there any chance you are pregnant? When was your last  menstrual period?  Protocols used: Abdominal Pain - Female-A-AH

## 2024-02-01 NOTE — Telephone Encounter (Signed)
 Patient is scheduled tomorrow 02/02/2024 with Joy Jessup, nP

## 2024-02-02 ENCOUNTER — Encounter: Payer: Self-pay | Admitting: Medical-Surgical

## 2024-02-02 ENCOUNTER — Ambulatory Visit: Payer: Medicare (Managed Care) | Admitting: Medical-Surgical

## 2024-02-02 VITALS — BP 127/77 | HR 75 | Temp 99.1°F | Resp 16 | Ht 66.0 in | Wt 166.1 lb

## 2024-02-02 DIAGNOSIS — K5792 Diverticulitis of intestine, part unspecified, without perforation or abscess without bleeding: Secondary | ICD-10-CM

## 2024-02-02 MED ORDER — AMOXICILLIN-POT CLAVULANATE 875-125 MG PO TABS: 1.0000 | ORAL_TABLET | Freq: Two times a day (BID) | ORAL | 0 refills | Status: AC

## 2024-02-02 NOTE — Progress Notes (Signed)
" ° °       Established patient visit   History of Present Illness   Discussed the use of AI scribe software for clinical note transcription with the patient, who gave verbal consent to proceed.  History of Present Illness   Sara Meza is a 49 year old female with diverticulitis who presents with abdominal pain.  Abdominal pain - Crampy pain began Monday around noon - Initially in the left upper quadrant, now primarily in the left lower quadrant - Pain is worse during cramps - Pain quality and location similar to prior diverticulitis episodes  Bowel habit changes - Two days of watery diarrhea followed by constipation  Associated symptoms - No fever - No chills  Dietary modifications and response - Increased dietary fiber - Avoided spicy and greasy foods - No relief of symptoms  Physical Exam   Physical Exam Vitals reviewed.  Constitutional:      General: She is not in acute distress.    Appearance: Normal appearance. She is well-developed. She is not ill-appearing.  HENT:     Head: Normocephalic and atraumatic.  Cardiovascular:     Rate and Rhythm: Normal rate and regular rhythm.     Pulses: Normal pulses.     Heart sounds: Normal heart sounds. No murmur heard.    No friction rub. No gallop.  Pulmonary:     Effort: Pulmonary effort is normal. No respiratory distress.     Breath sounds: Normal breath sounds. No wheezing.  Abdominal:     General: Bowel sounds are normal. There is no distension or abdominal bruit. There are no signs of injury.     Palpations: Abdomen is soft. There is no shifting dullness, fluid wave, hepatomegaly, splenomegaly, mass or pulsatile mass.     Tenderness: There is abdominal tenderness in the left upper quadrant and left lower quadrant. There is no guarding or rebound. Negative signs include Rovsing's sign.  Skin:    General: Skin is warm and dry.  Neurological:     Mental Status: She is alert and oriented to person, place, and time.   Psychiatric:        Mood and Affect: Mood normal.        Behavior: Behavior normal.        Thought Content: Thought content normal.        Judgment: Judgment normal.    Assessment & Plan   Diverticulitis Recurrent diverticulitis with significant pain and constipation. Augmentin  chosen for treatment due to its efficacy. - Prescribed Augmentin  875 mg twice daily for 10 days. - Advised to contact transplant team regarding holding CellCept during treatment. - Instructed to report if symptoms do not improve with antibiotics.    Follow up   Return if symptoms worsen or fail to improve. __________________________________ Zada FREDRIK Palin, DNP, APRN, FNP-BC Primary Care and Sports Medicine [facility] "

## 2024-02-03 ENCOUNTER — Ambulatory Visit: Payer: Medicare Other | Admitting: Medical-Surgical

## 2024-02-06 ENCOUNTER — Ambulatory Visit: Payer: Medicare (Managed Care) | Admitting: Urgent Care

## 2024-02-06 ENCOUNTER — Ambulatory Visit: Payer: Self-pay

## 2024-02-06 VITALS — BP 112/69 | HR 72 | Ht 66.0 in | Wt 165.0 lb

## 2024-02-06 DIAGNOSIS — M546 Pain in thoracic spine: Secondary | ICD-10-CM

## 2024-02-06 DIAGNOSIS — Z87448 Personal history of other diseases of urinary system: Secondary | ICD-10-CM

## 2024-02-06 DIAGNOSIS — Z940 Kidney transplant status: Secondary | ICD-10-CM | POA: Diagnosis not present

## 2024-02-06 LAB — POCT URINALYSIS DIP (CLINITEK)
Bilirubin, UA: NEGATIVE
Glucose, UA: NEGATIVE mg/dL
Ketones, POC UA: NEGATIVE mg/dL
Leukocytes, UA: NEGATIVE
Nitrite, UA: NEGATIVE
Spec Grav, UA: 1.025
Urobilinogen, UA: 0.2 U/dL
pH, UA: 5.5

## 2024-02-06 NOTE — Patient Instructions (Signed)
 Your urine sample shows no signs of infection and appears consistent with all of your baseline urine samples.  We will obtain further labs today.  Please take 500-1000mg  tylenol  up to every 8 hours as needed. Use a microwaveable heating pad to your back. Use topical lidocaine  patch (salonpas 4%) OTC; apply in the morning and remove after 12 hours.  If your symptoms persist or worsen, please obtain Xray imaging in [address] downstairs.

## 2024-02-06 NOTE — Telephone Encounter (Signed)
 FYI Only or Action Required?: FYI only for provider: appointment scheduled on 1/19.  Patient was last seen in primary care on 02/02/2024 by Sara Mini, NP.  Called Nurse Triage reporting Back Pain.  Symptoms began today.  Interventions attempted: OTC medications: Tylenol .  Symptoms are: gradually worsening.  Triage Disposition: See Physician Within 24 Hours  Patient/caregiver understands and will follow disposition?: Yes    Pt with hx of kidney transplant had appt on 1/15 for diverticulitis. Prescribed augmentin  x10 days. Was also advised to contact transplant team regarding holding CellCept during treatment. Pt did this and they said to continue treatment as she wasn't in the hospital.   Belly pain gone. Now reporting intermittent 7/10 low back back pain radiating up to neck today. No numbness or weakness or changes to bowel/bladder control. Had some tingling up her upper arms last night. No recent strain or injury. Last time she had back pain was when she discovered an issue with her kidney.   Scheduled appt with different provider at home office today d/t no PCP availability within timeframe. Advised UC or ED for worsening symptoms.     Message from [place] S sent at 02/06/2024  1:40 PM EST  Reason for Triage: pain- back   Reason for Disposition  High-risk adult (e.g., history of cancer, HIV, or IV drug use)  Answer Assessment - Initial Assessment Questions 1. ONSET: When did the pain begin? (e.g., minutes, hours, days)     Today  2. LOCATION: Where does it hurt? (upper, mid or lower back)     Center low back back pain radiating up to neck  3. SEVERITY: How bad is the pain?  (e.g., Scale 1-10; mild, moderate, or severe)     7/10 aching pain  4. PATTERN: Is the pain constant? (e.g., yes, no; constant, intermittent)      Comes and goes, random  5. RADIATION: Does the pain shoot into your legs or somewhere else?     Up to neck  6. CAUSE:  What do you  think is causing the back pain?      Unsure. Possible kidney related.  7. BACK OVERUSE:  Any recent lifting of heavy objects, strenuous work or exercise?     Denies  8. MEDICINES: What have you taken so far for the pain? (e.g., nothing, acetaminophen , NSAIDS)     Tylenol   9. NEUROLOGIC SYMPTOMS: Do you have any weakness, numbness, or problems with bowel/bladder control?     Denies  10. OTHER SYMPTOMS: Do you have any other symptoms? (e.g., fever, abdomen pain, burning with urination, blood in urine)       Migraine last night.  Protocols used: Back Pain-A-AH

## 2024-02-06 NOTE — Telephone Encounter (Signed)
 Patient scheduled today 02/06/2024 at 3:40pm with benton Gave, PA

## 2024-02-06 NOTE — Progress Notes (Unsigned)
 "  Established Patient Office Visit  Subjective:  Patient ID: Sara Meza, female    DOB: [birth year]  Age: 49 y.o. 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   Nonrheumatic mitral valve regurgitation 09/17/2020   Fluid overload, unspecified 12/31/2019   Rectal prolapse 11/21/2019   Mild protein-calorie malnutrition 11/12/2019   Allergy, unspecified, initial encounter 11/06/2019   Hypochloremia 09/03/2019   Hypokalemia 09/03/2019   Hyponatremia 09/03/2019   Nausea & vomiting 09/03/2019   Insomnia 05/28/2019   Neuropathy 05/28/2019   Hypercalcemia 04/27/2019   Disorder of lipoprotein metabolism, 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   Other long term (current) drug therapy 09/05/2018   Pelvic and perineal pain 08/14/2018   Early satiety 07/24/2018   Dyspepsia 07/24/2018   Vitamin D  deficiency 03/08/2018   Anemia of chronic disease 03/08/2018   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   Coagulation defect, unspecified 03/06/2018   Dyspnea, unspecified 03/03/2018   Pain, unspecified 03/03/2018   Peripheral edema 02/22/2018   Iron  deficiency anemia due to chronic blood loss 02/22/2018   Anemia in ESRD (end-stage renal disease) (HCC) 02/22/2018   Menorrhagia with regular cycle 02/22/2018   Hypertension goal BP (blood pressure) < 130/80 02/21/2018   Iron  deficiency 02/21/2018   Hypertensive retinopathy 02/21/2018   Essential hypertension 12/20/2014   Left ankle pain 08/25/2012   Patella-femoral syndrome 06/29/2012   Bilateral plantar fasciitis 06/29/2012   Restless legs 06/29/2012   Past Medical History:  Diagnosis Date   Anaphylactic shock, unspecified, initial encounter 11/06/2019    Anemia    Chronic kidney disease 02/22/2018   ESRD-dialysis, training to do dialysis at home. Currently does dialysis every day.    CKD (chronic kidney disease), stage V (HCC) 02/23/2018   Dependence on renal dialysis 11/06/2019   Dialysis patient    Diarrhea due to drug 07/24/2018   ESRD (end stage renal disease) on dialysis (HCC) 03/08/2018   ESRD on peritoneal dialysis (HCC) 05/28/2019   Family history of colon cancer requiring screening colonoscopy 02/22/2018   GERD (gastroesophageal reflux disease)    Gestational diabetes 09/25/2019   Hemolacria    History of D&C 11/07/2018   History of gestational diabetes 06/29/2012   History of varicose veins of lower extremity 02/22/2018   Hypertension    Rash and nonspecific skin eruption 02/22/2018   Status post tubal ligation 02/21/2018   Past Surgical History:  Procedure Laterality Date   AV FISTULA PLACEMENT Right 03/02/2018   Procedure: ARTERIOVENOUS (AV) FISTULA CREATION;  Surgeon: Sheree Penne Bruckner, MD;  Location: [facility] OR;  Service: Vascular;  Laterality: Right;   ENDOVENOUS ABLATION SAPHENOUS VEIN W/ LASER     HYSTEROSCOPY WITH D & C N/A 09/13/2018   Procedure: DILATATION AND CURETTAGE WITH MINERVA ABLATION;  Surgeon: Starla Harland BROCKS, MD;  Location: [facility] OR;  Service: Gynecology;  Laterality: N/A;  Rep will be here confirmed on 09/06/18 CS   IR FLUORO GUIDE CV LINE RIGHT  02/24/2018   IR US  GUIDE VASC ACCESS RIGHT  02/24/2018   KIDNEY TRANSPLANT     LAPAROSCOPY     tenckhoff catheter removal  10/28/2019   TUBAL LIGATION     Social History[1]    ROS: as noted in HPI  Objective:  BP 112/69   Pulse 72   Ht 5' 6 (1.676 m)   Wt 165 lb (74.8 kg)   BMI 26.63 kg/m  BP Readings from Last 3 Encounters:  02/06/24 112/69  02/02/24 127/77  06/06/23 124/72   Wt Readings from Last 3 Encounters:  02/06/24 165 lb (74.8 kg)  02/02/24 166 lb 1.9 oz (75.4 kg)  09/22/23 168 lb (76.2 kg)      Physical Exam   No results  found for any visits on 02/06/24.  Last CBC Lab Results  Component Value Date   WBC CANCELED 06/06/2023   HGB CANCELED 06/06/2023   HCT CANCELED 06/06/2023   MCV 94.0 11/02/2019   MCH 31.2 11/02/2019   RDW 13.6 11/02/2019   PLT CANCELED 06/06/2023   Last metabolic panel Lab Results  Component Value Date   GLUCOSE 109 (H) 03/18/2023   NA 144 03/18/2023   K 3.9 03/18/2023   CL 105 03/18/2023   CO2 20 03/18/2023   BUN 19 03/18/2023   CREATININE 1.05 (H) 03/18/2023   EGFR 65 03/18/2023   CALCIUM 9.2 03/18/2023   PHOS 4.1 03/03/2018   PROT 6.9 03/18/2023   ALBUMIN 4.6 03/18/2023   LABGLOB 2.3 03/18/2023   AGRATIO 1.0 02/22/2018   BILITOT 0.5 03/18/2023   ALKPHOS 94 03/18/2023   AST 17 03/18/2023   ALT 20 03/18/2023   ANIONGAP 23 (H) 05/02/2019   Last lipids Lab Results  Component Value Date   CHOL 233 (H) 03/18/2023   HDL 61 03/18/2023   LDLCALC 146 (H) 03/18/2023   TRIG 148 03/18/2023   CHOLHDL 3.8 03/18/2023   Last hemoglobin A1c Lab Results  Component Value Date   HGBA1C 4.4 09/24/2020   Last thyroid functions Lab Results  Component Value Date   TSH 1.66 09/24/2020   Last vitamin D  Lab Results  Component Value Date   VD25OH 10 (L) 04/10/2019   Last vitamin B12 and Folate Lab Results  Component Value Date   VITAMINB12 591 04/27/2019   FOLATE 42.3 04/27/2019      The 10-year ASCVD risk score (Arnett DK, et al., 2019) is: 0.9%  Assessment & Plan:  There are no diagnoses linked to this encounter.   No follow-ups on file.   Benton LITTIE Gave, PA    [1]  Social History Tobacco Use   Smoking status: Never   Smokeless tobacco: Never  Vaping Use   Vaping status: Never Used  Substance Use Topics   Alcohol use: Not Currently   Drug use: Never   "

## 2024-02-07 ENCOUNTER — Ambulatory Visit: Payer: Self-pay | Admitting: Urgent Care

## 2024-02-07 ENCOUNTER — Encounter: Payer: Self-pay | Admitting: Urgent Care

## 2024-02-07 LAB — CBC WITH DIFFERENTIAL/PLATELET
Basophils Absolute: 0 x10E3/uL (ref 0.0–0.2)
Basos: 1 %
EOS (ABSOLUTE): 0.1 x10E3/uL (ref 0.0–0.4)
Eos: 1 %
Hematocrit: 41.1 % (ref 34.0–46.6)
Hemoglobin: 13.7 g/dL (ref 11.1–15.9)
Immature Grans (Abs): 0.2 x10E3/uL — ABNORMAL HIGH (ref 0.0–0.1)
Immature Granulocytes: 2 %
Lymphocytes Absolute: 0.8 x10E3/uL (ref 0.7–3.1)
Lymphs: 11 %
MCH: 31.1 pg (ref 26.6–33.0)
MCHC: 33.3 g/dL (ref 31.5–35.7)
MCV: 93 fL (ref 79–97)
Monocytes Absolute: 0.4 x10E3/uL (ref 0.1–0.9)
Monocytes: 5 %
Neutrophils Absolute: 5.3 x10E3/uL (ref 1.4–7.0)
Neutrophils: 80 %
Platelets: 238 x10E3/uL (ref 150–450)
RBC: 4.4 x10E6/uL (ref 3.77–5.28)
RDW: 13.6 % (ref 11.7–15.4)
WBC: 6.7 x10E3/uL (ref 3.4–10.8)

## 2024-02-07 LAB — CMP14+EGFR
ALT: 19 IU/L (ref 0–32)
AST: 16 IU/L (ref 0–40)
Albumin: 4.8 g/dL (ref 3.9–4.9)
Alkaline Phosphatase: 91 IU/L (ref 41–116)
BUN/Creatinine Ratio: 19 (ref 9–23)
BUN: 20 mg/dL (ref 6–24)
Bilirubin Total: 0.4 mg/dL (ref 0.0–1.2)
CO2: 21 mmol/L (ref 20–29)
Calcium: 10.8 mg/dL — ABNORMAL HIGH (ref 8.7–10.2)
Chloride: 101 mmol/L (ref 96–106)
Creatinine, Ser: 1.04 mg/dL — ABNORMAL HIGH (ref 0.57–1.00)
Globulin, Total: 2.7 g/dL (ref 1.5–4.5)
Glucose: 147 mg/dL — ABNORMAL HIGH (ref 70–99)
Potassium: 4.5 mmol/L (ref 3.5–5.2)
Sodium: 139 mmol/L (ref 134–144)
Total Protein: 7.5 g/dL (ref 6.0–8.5)
eGFR: 65 mL/min/1.73

## 2024-09-25 ENCOUNTER — Ambulatory Visit
